# Patient Record
Sex: Male | Born: 1980
Health system: Southern US, Community
[De-identification: ages and names within clinical notes are randomized; demographics above are authoritative.]

## PROBLEM LIST (undated history)

## (undated) DIAGNOSIS — G44009 Cluster headache syndrome, unspecified, not intractable: Secondary | ICD-10-CM

## (undated) DIAGNOSIS — R7303 Prediabetes: Secondary | ICD-10-CM

## (undated) DIAGNOSIS — E669 Obesity, unspecified: Secondary | ICD-10-CM

## (undated) DIAGNOSIS — K219 Gastro-esophageal reflux disease without esophagitis: Secondary | ICD-10-CM

## (undated) DIAGNOSIS — G4733 Obstructive sleep apnea (adult) (pediatric): Secondary | ICD-10-CM

## (undated) DIAGNOSIS — I4510 Unspecified right bundle-branch block: Secondary | ICD-10-CM

## (undated) DIAGNOSIS — G4730 Sleep apnea, unspecified: Secondary | ICD-10-CM

## (undated) DIAGNOSIS — K760 Fatty (change of) liver, not elsewhere classified: Secondary | ICD-10-CM

## (undated) HISTORY — PX: FOOT SURGERY: SHX648

## (undated) HISTORY — DX: Obesity, unspecified: E66.9

## (undated) HISTORY — PX: TONSILLECTOMY: SUR1361

## (undated) HISTORY — DX: Prediabetes: R73.03

## (undated) HISTORY — DX: Gastro-esophageal reflux disease without esophagitis: K21.9

## (undated) HISTORY — DX: Fatty (change of) liver, not elsewhere classified: K76.0

## (undated) HISTORY — DX: Unspecified right bundle-branch block: I45.10

## (undated) HISTORY — DX: Obstructive sleep apnea (adult) (pediatric): G47.33

---
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
## 2000-03-16 ENCOUNTER — Emergency Department (HOSPITAL_COMMUNITY): Admission: EM | Admit: 2000-03-16 | Discharge: 2000-03-16 | Payer: Self-pay | Admitting: Internal Medicine

## 2000-06-18 ENCOUNTER — Encounter: Payer: Self-pay | Admitting: Emergency Medicine

## 2000-06-18 ENCOUNTER — Emergency Department (HOSPITAL_COMMUNITY): Admission: EM | Admit: 2000-06-18 | Discharge: 2000-06-18 | Payer: Self-pay | Admitting: Emergency Medicine

## 2000-08-27 ENCOUNTER — Ambulatory Visit (HOSPITAL_BASED_OUTPATIENT_CLINIC_OR_DEPARTMENT_OTHER): Admission: RE | Admit: 2000-08-27 | Discharge: 2000-08-27 | Payer: Self-pay | Admitting: Orthopedic Surgery

## 2000-08-31 ENCOUNTER — Encounter (HOSPITAL_COMMUNITY): Admission: RE | Admit: 2000-08-31 | Discharge: 2000-11-29 | Payer: Self-pay | Admitting: Orthopedic Surgery

## 2001-05-05 ENCOUNTER — Ambulatory Visit (HOSPITAL_BASED_OUTPATIENT_CLINIC_OR_DEPARTMENT_OTHER): Admission: RE | Admit: 2001-05-05 | Discharge: 2001-05-05 | Payer: Self-pay | Admitting: Internal Medicine

## 2001-06-07 ENCOUNTER — Ambulatory Visit (HOSPITAL_BASED_OUTPATIENT_CLINIC_OR_DEPARTMENT_OTHER): Admission: RE | Admit: 2001-06-07 | Discharge: 2001-06-07 | Payer: Self-pay | Admitting: Internal Medicine

## 2001-06-15 ENCOUNTER — Emergency Department (HOSPITAL_COMMUNITY): Admission: EM | Admit: 2001-06-15 | Discharge: 2001-06-16 | Payer: Self-pay | Admitting: *Deleted

## 2001-06-16 ENCOUNTER — Encounter: Payer: Self-pay | Admitting: Emergency Medicine

## 2001-08-12 ENCOUNTER — Ambulatory Visit (HOSPITAL_BASED_OUTPATIENT_CLINIC_OR_DEPARTMENT_OTHER): Admission: RE | Admit: 2001-08-12 | Discharge: 2001-08-12 | Payer: Self-pay | Admitting: Orthopedic Surgery

## 2001-08-12 ENCOUNTER — Encounter (INDEPENDENT_AMBULATORY_CARE_PROVIDER_SITE_OTHER): Payer: Self-pay | Admitting: Specialist

## 2003-09-17 ENCOUNTER — Emergency Department (HOSPITAL_COMMUNITY): Admission: EM | Admit: 2003-09-17 | Discharge: 2003-09-17 | Payer: Self-pay | Admitting: Emergency Medicine

## 2003-11-01 ENCOUNTER — Encounter (INDEPENDENT_AMBULATORY_CARE_PROVIDER_SITE_OTHER): Payer: Self-pay | Admitting: *Deleted

## 2003-11-01 ENCOUNTER — Observation Stay (HOSPITAL_COMMUNITY): Admission: RE | Admit: 2003-11-01 | Discharge: 2003-11-02 | Payer: Self-pay | Admitting: Otolaryngology

## 2003-11-06 ENCOUNTER — Ambulatory Visit (HOSPITAL_COMMUNITY): Admission: RE | Admit: 2003-11-06 | Discharge: 2003-11-06 | Payer: Self-pay | Admitting: Otolaryngology

## 2004-06-12 ENCOUNTER — Emergency Department (HOSPITAL_COMMUNITY): Admission: EM | Admit: 2004-06-12 | Discharge: 2004-06-12 | Payer: Self-pay | Admitting: Emergency Medicine

## 2004-06-30 ENCOUNTER — Emergency Department (HOSPITAL_COMMUNITY): Admission: EM | Admit: 2004-06-30 | Discharge: 2004-06-30 | Payer: Self-pay | Admitting: Emergency Medicine

## 2007-12-17 ENCOUNTER — Ambulatory Visit: Payer: Self-pay | Admitting: Internal Medicine

## 2007-12-17 DIAGNOSIS — F17200 Nicotine dependence, unspecified, uncomplicated: Secondary | ICD-10-CM | POA: Insufficient documentation

## 2007-12-17 DIAGNOSIS — K219 Gastro-esophageal reflux disease without esophagitis: Secondary | ICD-10-CM | POA: Insufficient documentation

## 2007-12-17 LAB — CONVERTED CEMR LAB
Bilirubin Urine: NEGATIVE
Blood in Urine, dipstick: NEGATIVE
Glucose, Urine, Semiquant: NEGATIVE
Ketones, urine, test strip: NEGATIVE
Nitrite: POSITIVE
Specific Gravity, Urine: 1.02
Urobilinogen, UA: 0.2
WBC Urine, dipstick: NEGATIVE
pH: 7

## 2007-12-21 ENCOUNTER — Encounter: Payer: Self-pay | Admitting: Internal Medicine

## 2007-12-21 DIAGNOSIS — R740 Nonspecific elevation of levels of transaminase and lactic acid dehydrogenase [LDH]: Secondary | ICD-10-CM

## 2007-12-21 LAB — CONVERTED CEMR LAB
ALT: 99 units/L — ABNORMAL HIGH (ref 0–53)
AST: 140 units/L — ABNORMAL HIGH (ref 0–37)
Albumin: 3.4 g/dL — ABNORMAL LOW (ref 3.5–5.2)
Alkaline Phosphatase: 54 units/L (ref 39–117)
BUN: 11 mg/dL (ref 6–23)
Basophils Absolute: 0 10*3/uL (ref 0.0–0.1)
Basophils Relative: 0.3 % (ref 0.0–1.0)
Bilirubin, Direct: 0.2 mg/dL (ref 0.0–0.3)
CO2: 29 meq/L (ref 19–32)
Calcium: 9.6 mg/dL (ref 8.4–10.5)
Chloride: 103 meq/L (ref 96–112)
Cholesterol: 156 mg/dL (ref 0–200)
Creatinine, Ser: 1 mg/dL (ref 0.4–1.5)
Eosinophils Absolute: 0.2 10*3/uL (ref 0.0–0.6)
Eosinophils Relative: 1.9 % (ref 0.0–5.0)
GFR calc Af Amer: 115 mL/min
GFR calc non Af Amer: 95 mL/min
Glucose, Bld: 87 mg/dL (ref 70–99)
HCT: 44.5 % (ref 39.0–52.0)
HCV Ab: NEGATIVE
HDL: 23.7 mg/dL — ABNORMAL LOW (ref >39.0)
Hemoglobin: 14.1 g/dL (ref 13.0–17.0)
Hep B C IgM: NEGATIVE
Hep B Core Total Ab: NEGATIVE
Hep B S Ab: POSITIVE — AB
Hepatitis B Surface Ag: NEGATIVE
LDL Cholesterol: 100 mg/dL — ABNORMAL HIGH (ref 0–99)
Lymphocytes Relative: 25.3 % (ref 12.0–46.0)
MCHC: 31.7 g/dL (ref 30.0–36.0)
MCV: 83 fL (ref 78.0–100.0)
Monocytes Absolute: 0.8 10*3/uL — ABNORMAL HIGH (ref 0.2–0.7)
Monocytes Relative: 6.9 % (ref 3.0–11.0)
Neutro Abs: 7.7 10*3/uL (ref 1.4–7.7)
Neutrophils Relative %: 65.6 % (ref 43.0–77.0)
Platelets: 273 10*3/uL (ref 150–400)
Potassium: 4.4 meq/L (ref 3.5–5.1)
RBC: 5.36 M/uL (ref 4.22–5.81)
RDW: 13.2 % (ref 11.5–14.6)
Sodium: 139 meq/L (ref 135–145)
TSH: 0.9 microintl units/mL (ref 0.35–5.50)
Total Bilirubin: 0.7 mg/dL (ref 0.3–1.2)
Total CHOL/HDL Ratio: 6.6
Total Protein: 6 g/dL (ref 6.0–8.3)
Triglycerides: 164 mg/dL — ABNORMAL HIGH (ref 0–149)
VLDL: 33 mg/dL (ref 0–40)
WBC: 11.7 10*3/uL — ABNORMAL HIGH (ref 4.5–10.5)

## 2009-09-29 ENCOUNTER — Emergency Department (HOSPITAL_COMMUNITY): Admission: EM | Admit: 2009-09-29 | Discharge: 2009-09-29 | Payer: Self-pay | Admitting: Emergency Medicine

## 2010-10-10 ENCOUNTER — Emergency Department (HOSPITAL_COMMUNITY)
Admission: EM | Admit: 2010-10-10 | Discharge: 2010-10-10 | Payer: Self-pay | Source: Home / Self Care | Admitting: Emergency Medicine

## 2011-01-08 ENCOUNTER — Emergency Department: Payer: Self-pay | Admitting: Emergency Medicine

## 2011-03-03 ENCOUNTER — Emergency Department (HOSPITAL_COMMUNITY)
Admission: EM | Admit: 2011-03-03 | Discharge: 2011-03-03 | Disposition: A | Discharge status: Home / Self Care | Payer: Self-pay | Attending: Emergency Medicine | Admitting: Emergency Medicine

## 2011-03-03 ENCOUNTER — Emergency Department (HOSPITAL_COMMUNITY): Payer: Self-pay

## 2011-03-03 DIAGNOSIS — R071 Chest pain on breathing: Secondary | ICD-10-CM | POA: Insufficient documentation

## 2011-03-03 DIAGNOSIS — R079 Chest pain, unspecified: Secondary | ICD-10-CM | POA: Insufficient documentation

## 2011-03-03 DIAGNOSIS — R0602 Shortness of breath: Secondary | ICD-10-CM | POA: Insufficient documentation

## 2011-03-03 DIAGNOSIS — F17200 Nicotine dependence, unspecified, uncomplicated: Secondary | ICD-10-CM | POA: Insufficient documentation

## 2011-03-03 DIAGNOSIS — R0789 Other chest pain: Secondary | ICD-10-CM | POA: Insufficient documentation

## 2011-03-03 LAB — POCT I-STAT, CHEM 8
BUN: 14 mg/dL (ref 6–23)
Calcium, Ion: 1.13 mmol/L (ref 1.12–1.32)
Chloride: 101 mEq/L (ref 96–112)
Creatinine, Ser: 1 mg/dL (ref 0.4–1.5)
Glucose, Bld: 105 mg/dL — ABNORMAL HIGH (ref 70–99)
HCT: 46 % (ref 39.0–52.0)
Hemoglobin: 15.6 g/dL (ref 13.0–17.0)
Potassium: 3.7 mEq/L (ref 3.5–5.1)
Sodium: 138 mEq/L (ref 135–145)
TCO2: 28 mmol/L (ref 0–100)

## 2011-03-03 LAB — D-DIMER, QUANTITATIVE

## 2011-03-07 NOTE — Op Note (Signed)
[place]. [facility]  Patient:    Jorge Allen, Jorge Allen                       MRN: [id number] Proc. Date: 08/27/00 Adm. Date:  [id number] Attending:  Ronne Binning CC:         Two copies to Dr. Merlyn Lot   Operative Report  PREOPERATIVE DIAGNOSIS:  Abscess left middle finger.  POSTOPERATIVE DIAGNOSIS:  Abscess left middle finger.  OPERATION:  Incision and drainage left middle finger.  SURGEON:  Nicki Reaper, M.D.  ASSISTANT:  Joaquin Courts, R.N.  ANESTHESIA:  Metacarpal block.  DESCRIPTION OF PROCEDURE:  The patient was given a metacarpal block with 1% xylocaine, 4 cc was used.  This was without epinephrine.  He was prepped and draped using Betadine scrub and solution.  A Penrose drain was used for tourniquet control at the base of the finger.  An oblique incision was made over the mass, carried down through subcutaneous tissue.  The abscess cavity was immediately encountered.  This was opened.  Cultures were taken.  This was irrigated and packed with Iodoform gauze.  Sterile compressive dressing was applied.  The patient tolerated the procedure well.  He was discharged home to return to the [facility] on Monday on Tylenol 3 and the Ampicillin which he was given. DD:  08/27/00 TD:  08/28/00 Job: [id number]

## 2011-03-07 NOTE — Op Note (Signed)
Jorge Allen, Jorge Allen                        ACCOUNT NO.:  [id number]   MEDICAL RECORD NO.:  [id number]                   PATIENT TYPE:  INP   LOCATION:  2106                                 FACILITY:  [facility]   PHYSICIAN:  Lucky Cowboy, M.D.                    DATE OF BIRTH:  08-15-1981   DATE OF PROCEDURE:  11/01/2003  DATE OF DISCHARGE:  11/02/2003                                 OPERATIVE REPORT   PREOPERATIVE DIAGNOSES:  Chronic left serous effusion, obstructive sleep  apnea, tonsillar hypertrophy, bilateral inferior turbinate reduction.   POSTOPERATIVE DIAGNOSES:  Chronic left serous effusion, obstructive sleep  apnea, tonsillar hypertrophy, bilateral inferior turbinate reduction, with  obstructing adenoid hypertrophy.   PROCEDURE:  Left myringotomy with tube placement, tonsillectomy,  uvulopharyngopalatoplasty, adenoidectomy, bilateral inferior turbinate  reduction.   SURGEON:  Lucky Cowboy, M.D.   ANESTHESIA:  General endotracheal anesthesia.   ESTIMATED BLOOD LOSS:  30 mL.   SPECIMENS:  Tonsils and soft palate as well as adenoid tissue.   COMPLICATIONS:  None.   INDICATIONS:  This patient is a 30 year old male with severe obstructive  sleep apnea and an RDI of 49.  He has had extreme difficulty with nasal  obstruction despite the use of the nasal steroid sprays.  It was felt that  this would eliminate his ability to tolerate the CPAP machine.  He cannot  breathe well through his mouth or nose.  There is profuse tonsillar  hypertrophy with inability to see the posterior pharyngeal wall.  Additionally, there is chronic left ear pain with an ongoing serous effusion  which has failed to clear after six weeks despite antibiotic therapy.  This  is associated with a conductive hearing loss.  For these reasons, the above  procedures are performed.   FINDINGS:  The patient was noted to have profuse tonsillar hypertrophy with  an elongated soft palate and uvula.  There were  also findings of an  obstructing adenoid hypertrophy, and for this reason, the adenoid tissue was  removed.  The patient had a serous effusion with significant middle ear  mucosal edema.  There was profuse bony and soft tissue turbinate  hypertrophy.   PROCEDURE:  The patient was taken to the operating room and placed on the  table in the supine position.  He was then placed under general endotracheal  anesthesia and a #4 ear speculum placed into the right external auditory  canal.  With the aid of the operating microscope, cerumen was removed with a  curette and suctioned.  There were no abnormalities of the right tympanic  membrane and middle ear space.  Attention was then turned to the left ear.  In a similar fashion, cerumen was removed.  A myringotomy knife was used to  make an incision in the anterior-inferior quadrant.  Middle ear fluid was  evacuated and an Activent tube placed through the tympanic membrane and  secured in place with a pick.  Ciprodex Otic was instilled.  The table was  then rotated counter-clockwise 90 degrees.   The head and body were draped in the usual sterile fashion.  A Crowe-Davis  mouth gag with a #5 tongue blade was then placed intraorally, opened and  suspended on the Mayo stand.  Inspection of the nasopharynx was performed  using a mirror with a large amount of adenoid tissue being identified.  The  tonsillectomy portion of the procedure was performed first.  The right  palatine tonsils were grasped with Allis clamps and directed inferomedially.  Harmonic scalpel was then used to excise the tonsil, staying within the  peritonsillar space.  The left palatine tonsil was removed in an identical  fashion.  At this point, the velopharyngeal dimple was identified using  digital palpation.  The Harmonic scalpel was then used to excise this level  of the soft palate.  Back cuts were made in the anterior tonsillar pillars  bilaterally.  The anterior and  posterior tonsillar pillars were  reapproximated in a simple interrupted fashion using 4-0 Vicryl suture.  The  soft palate was reapproximated in a simple interrupted fashion using 4-0  Vicryl.   At this point, the palate was suspended using a red rubber catheter.  A  large adenoid curette was placed against the __________and directed  inferiorly, severing the majority of the adenoid pad.  Subsequent passes  were made.  Two sterile gauze Afrin-soaked packs were placed in the  nasopharynx and time allowed for hemostasis.  The packs were removed and  suction cautery performed.  There was a significant amount of adenoid  hypertrophy with some extension into the nasal cavities which was  cauterized.  The nasopharynx was copiously irrigated transnasally, which was  suctioned out through the oral cavity.  An NG tube was then placed down the  esophagus for suctioning of the gastric contents.  The mouth gag was then  removed, noting no damage to the teeth or soft tissues.   At this point, both sides of the nasal cavity were decongested using Afrin  on cottonoid pledgets.  The 0-degree Storz Hopkins endoscope was used to  visualize both sides of the nasal cavity.  The left side was performed  first.  The left inferior turbinate was injected with 1% lidocaine with  1:100,000 of epinephrine.  The microdebrider was then used to remove the  redundant mucosa over the lower one-half of the turbinate.  Redundant bone  was taken down using the Tru-Cut forceps.  Suction cautery was performed.  The right inferior one-half of the inferior turbinate was removed in an  identical fashion.  Suction cautery was performed.  Bactroban-coated Telfa  packs were placed in both side of the nasal cavity and tied to one another  anterior to the columella.  The table was rotated clockwise 90 degrees to  its original position.  The patient was awakened from anesthesia and extubated in the operating  room.  Upon  awakening from anesthesia, the patient did go into laryngospasm  and had to be briefly paralyzed.  He was then brought up on mask ventilation  and taken to the recovery room in stable condition.                                               Lucky Cowboy, M.D.  SJ/MEDQ  D:  11/03/2003  T:  11/03/2003  Job:  [id number]   cc:   [facility]   Thora Lance, M.D.  [address]  [place]  [place] [address]  Fax: [phone]

## 2011-03-07 NOTE — Op Note (Signed)
[place]. [facility]  Patient:    LYMON, KIDNEY Visit Number: [id number] MRN: [id number]          Service Type: DSU Location: [facility] Attending Physician:  Ronne Binning Dictated by:   Nicki Reaper, M.D. Proc. Date: 08/12/01 Admit Date:  08/12/2001                             Operative Report  PREOPERATIVE DIAGNOSIS: Multiple mass, probable cyst, of right index and right ring fingers.  POSTOPERATIVE DIAGNOSIS: Multiple mass, probable cyst, of right index and right ring fingers.  OPERATION/PROCEDURE: Excision of mass of distal interphalangeal phalanx of index and proximal interphalangeal joint of index, distal interphalangeal phalanx of ring and P ring of right hand.  SURGEON: Nicki Reaper, M.D.  ASSISTANT: Joaquin Courts, R.N.  ANESTHESIA: IV regional.  ANESTHESIOLOGIST: Burna Forts, M.D.  INDICATIONS FOR PROCEDURE: The patient is a 30 year old male, with a history of a series of masses at the metacarpophalangeal joint crease of his ring finger, DIP joint of his ring, PIP of his index, DIP of his index of right hand, and desires having these removed.  DESCRIPTION OF PROCEDURE: The patient was brought to the operating room, where a forearm-based IV regional anesthetic was carried out without difficulty.  He was prepped and draped using Betadine scrubbing solution with the right arm free.  Each mass was excised separately using sharp dissection.  Elliptical incisions were made transversely in the joint crease in that each one of these was in the joint crease.  These were removed separately from the DIP of the index, the PIP of the index, the MP of the ring and the DIP of the ring finger.  Care was taken to protect neurovascular structures.  Hard masses with each were sent separately to pathology.  The wounds were irrigated.  The skin was closed with interrupted 5-0 nylon sutures.  A sterile compressive dressing was applied to each  finger.  The patient tolerated the procedure well and was taken to the recovery room for observation in satisfactory condition.  He is discharged home to return to the [facility] in one week, on Vicodin and Keflex. Dictated by:   Nicki Reaper, M.D. Attending Physician:  Ronne Binning DD:  08/12/01 TD:  08/13/01 Job: [id number]

## 2011-05-29 ENCOUNTER — Emergency Department (HOSPITAL_COMMUNITY)
Admission: EM | Admit: 2011-05-29 | Discharge: 2011-05-29 | Disposition: A | Discharge status: Home / Self Care | Payer: Self-pay | Attending: Emergency Medicine | Admitting: Emergency Medicine

## 2011-05-29 DIAGNOSIS — J069 Acute upper respiratory infection, unspecified: Secondary | ICD-10-CM | POA: Insufficient documentation

## 2011-05-29 DIAGNOSIS — R490 Dysphonia: Secondary | ICD-10-CM | POA: Insufficient documentation

## 2011-06-01 ENCOUNTER — Emergency Department (HOSPITAL_COMMUNITY)
Admission: EM | Admit: 2011-06-01 | Discharge: 2011-06-01 | Disposition: A | Discharge status: Home / Self Care | Payer: Self-pay | Attending: Emergency Medicine | Admitting: Emergency Medicine

## 2011-06-01 DIAGNOSIS — R05 Cough: Secondary | ICD-10-CM | POA: Insufficient documentation

## 2011-06-01 DIAGNOSIS — J029 Acute pharyngitis, unspecified: Secondary | ICD-10-CM | POA: Insufficient documentation

## 2011-06-01 DIAGNOSIS — R059 Cough, unspecified: Secondary | ICD-10-CM | POA: Insufficient documentation

## 2011-06-01 LAB — RAPID STREP SCREEN (MED CTR MEBANE ONLY): Streptococcus, Group A Screen (Direct): NEGATIVE

## 2012-10-25 ENCOUNTER — Emergency Department (HOSPITAL_COMMUNITY)
Admission: EM | Admit: 2012-10-25 | Discharge: 2012-10-25 | Disposition: A | Discharge status: Home Health | Payer: BC Managed Care – PPO | Attending: Emergency Medicine | Admitting: Emergency Medicine

## 2012-10-25 ENCOUNTER — Emergency Department (HOSPITAL_COMMUNITY): Payer: BC Managed Care – PPO

## 2012-10-25 ENCOUNTER — Encounter (HOSPITAL_COMMUNITY): Payer: Self-pay | Admitting: Emergency Medicine

## 2012-10-25 DIAGNOSIS — R0989 Other specified symptoms and signs involving the circulatory and respiratory systems: Secondary | ICD-10-CM | POA: Insufficient documentation

## 2012-10-25 DIAGNOSIS — M5380 Other specified dorsopathies, site unspecified: Secondary | ICD-10-CM | POA: Insufficient documentation

## 2012-10-25 DIAGNOSIS — M62830 Muscle spasm of back: Secondary | ICD-10-CM

## 2012-10-25 DIAGNOSIS — F17200 Nicotine dependence, unspecified, uncomplicated: Secondary | ICD-10-CM | POA: Insufficient documentation

## 2012-10-25 DIAGNOSIS — R0609 Other forms of dyspnea: Secondary | ICD-10-CM | POA: Insufficient documentation

## 2012-10-25 DIAGNOSIS — R0602 Shortness of breath: Secondary | ICD-10-CM | POA: Insufficient documentation

## 2012-10-25 DIAGNOSIS — R079 Chest pain, unspecified: Secondary | ICD-10-CM | POA: Insufficient documentation

## 2012-10-25 LAB — CBC WITH DIFFERENTIAL/PLATELET
Basophils Absolute: 0 10*3/uL (ref 0.0–0.1)
Basophils Relative: 0 % (ref 0–1)
Eosinophils Absolute: 0.2 10*3/uL (ref 0.0–0.7)
Eosinophils Relative: 1 % (ref 0–5)
HCT: 44.4 % (ref 39.0–52.0)
Hemoglobin: 14.6 g/dL (ref 13.0–17.0)
Lymphocytes Relative: 31 % (ref 12–46)
Lymphs Abs: 4.6 10*3/uL — ABNORMAL HIGH (ref 0.7–4.0)
MCH: 27.7 pg (ref 26.0–34.0)
MCHC: 32.9 g/dL (ref 30.0–36.0)
MCV: 84.1 fL (ref 78.0–100.0)
Monocytes Absolute: 0.9 10*3/uL (ref 0.1–1.0)
Monocytes Relative: 6 % (ref 3–12)
Neutro Abs: 8.9 10*3/uL — ABNORMAL HIGH (ref 1.7–7.7)
Neutrophils Relative %: 61 % (ref 43–77)
Platelets: 236 10*3/uL (ref 150–400)
RBC: 5.28 MIL/uL (ref 4.22–5.81)
RDW: 14.3 % (ref 11.5–15.5)
WBC: 14.7 10*3/uL — ABNORMAL HIGH (ref 4.0–10.5)

## 2012-10-25 LAB — BASIC METABOLIC PANEL
BUN: 14 mg/dL (ref 6–23)
CO2: 25 mEq/L (ref 19–32)
Calcium: 9.1 mg/dL (ref 8.4–10.5)
Chloride: 100 mEq/L (ref 96–112)
Creatinine, Ser: 0.84 mg/dL (ref 0.50–1.35)
GFR calc Af Amer: >90 mL/min (ref >90)
GFR calc non Af Amer: >90 mL/min (ref >90)
Glucose, Bld: 104 mg/dL — ABNORMAL HIGH (ref 70–99)
Potassium: 4.3 mEq/L (ref 3.5–5.1)
Sodium: 135 mEq/L (ref 135–145)

## 2012-10-25 LAB — D-DIMER, QUANTITATIVE (NOT AT ARMC): D-Dimer, Quant: <0.27 ug/mL-FEU (ref 0.00–0.48)

## 2012-10-25 MED ORDER — FENTANYL CITRATE 0.05 MG/ML IJ SOLN
100.0000 ug | Freq: Once | INTRAMUSCULAR | Status: AC
  Administered 2012-10-25: 100 ug via INTRAVENOUS
  Filled 2012-10-25: qty 2

## 2012-10-25 MED ORDER — DIAZEPAM 5 MG PO TABS
5.0000 mg | ORAL_TABLET | Freq: Once | ORAL | Status: AC
  Administered 2012-10-25: 5 mg via ORAL
  Filled 2012-10-25: qty 1

## 2012-10-25 MED ORDER — OXYCODONE-ACETAMINOPHEN 5-325 MG PO TABS
2.0000 | ORAL_TABLET | Freq: Once | ORAL | Status: AC
  Administered 2012-10-25: 2 via ORAL
  Filled 2012-10-25: qty 2

## 2012-10-25 MED ORDER — OXYCODONE-ACETAMINOPHEN 5-325 MG PO TABS: 2.0000 | ORAL_TABLET | Freq: Four times a day (QID) | ORAL | Status: DC | PRN

## 2012-10-25 MED ORDER — DIAZEPAM 5 MG PO TABS: 5.0000 mg | ORAL_TABLET | Freq: Three times a day (TID) | ORAL | Status: DC | PRN

## 2012-10-25 MED ORDER — HYDROCODONE-ACETAMINOPHEN 5-325 MG PO TABS: 2.0000 | ORAL_TABLET | ORAL | Status: DC | PRN

## 2012-10-25 MED ORDER — MORPHINE SULFATE 4 MG/ML IJ SOLN
4.0000 mg | Freq: Once | INTRAMUSCULAR | Status: AC
  Administered 2012-10-25: 4 mg via INTRAVENOUS
  Filled 2012-10-25: qty 1

## 2012-10-25 MED ORDER — IBUPROFEN 800 MG PO TABS: 800.0000 mg | ORAL_TABLET | Freq: Three times a day (TID) | ORAL | Status: DC

## 2012-10-25 NOTE — ED Provider Notes (Signed)
History     CSN: [id number]  Arrival date & time 10/25/12  0151   First MD Initiated Contact with Patient 10/25/12 0224      Chief Complaint  Patient presents with  . Shortness of Breath    (Consider location/radiation/quality/duration/timing/severity/associated sxs/prior treatment) HPI 32 year old male presents to emergency room from home with acute onset of pain in his right posterior back and shortness of breath. Patient reports taking deep breath if the pain worse. He was talking on the phone with the pain came on. He denies any trauma. He has had no cough or fever. No leg swelling no prolonged immobilization no history of PE or DVT in himself or family. He has no medical problems, and takes no medications. History reviewed. No pertinent past medical history.  Past Surgical History  Procedure Date  . Foot surgery   . Tonsillectomy     No family history on file.  History  Substance Use Topics  . Smoking status: Current Every Day Smoker  . Smokeless tobacco: Not on file  . Alcohol Use: Yes      Review of Systems  Unable to perform ROS: Other   significant pain  Allergies  Review of patient's allergies indicates no known allergies.  Home Medications   Current Outpatient Rx  Name  Route  Sig  Dispense  Refill  . DIAZEPAM 5 MG PO TABS   Oral   Take 1 tablet (5 mg total) by mouth every 8 (eight) hours as needed (muscle spasm).   20 tablet   0   . HYDROCODONE-ACETAMINOPHEN 5-325 MG PO TABS   Oral   Take 2 tablets by mouth every 4 (four) hours as needed for pain.   20 tablet   0   . IBUPROFEN 800 MG PO TABS   Oral   Take 1 tablet (800 mg total) by mouth 3 (three) times daily.   21 tablet   0     BP 128/57  Temp 98.3 F (36.8 C) (Oral)  Resp 22  SpO2 96%  Physical Exam  Nursing note and vitals reviewed. Constitutional: He is oriented to person, place, and time. He appears distressed ( patient appears to be in significant pain).  HENT:  Head:  Normocephalic and atraumatic.  Nose: Nose normal.  Mouth/Throat: Oropharynx is clear and moist.  Neck: No JVD present. No tracheal deviation present. No thyromegaly present.  Cardiovascular: Normal rate, regular rhythm, normal heart sounds and intact distal pulses.  Exam reveals no gallop and no friction rub.   No murmur heard. Pulmonary/Chest: Effort normal and breath sounds normal. No stridor. No respiratory distress. He has no wheezes. He has no rales. He exhibits tenderness (tenderness of palpation to right lower posterior chest wall without skin lesions).  Abdominal: Soft. Bowel sounds are normal. He exhibits no distension and no mass. There is no tenderness. There is no rebound and no guarding.  Musculoskeletal: Normal range of motion. He exhibits no edema and no tenderness.  Lymphadenopathy:    He has no cervical adenopathy.  Neurological: He is alert and oriented to person, place, and time.  Skin: Skin is warm and dry. No rash noted. No erythema. No pallor.    ED Course  Procedures (including critical care time)  Labs Reviewed  CBC WITH DIFFERENTIAL - Abnormal; Notable for the following:    WBC 14.7 (*)     Neutro Abs 8.9 (*)     Lymphs Abs 4.6 (*)     All other components within  normal limits  BASIC METABOLIC PANEL - Abnormal; Notable for the following:    Glucose, Bld 104 (*)     All other components within normal limits  D-DIMER, QUANTITATIVE   Dg Chest 2 View  10/25/2012  *RADIOLOGY REPORT*  Clinical Data: Sudden onset shortness of breath.  Chest and back pain.  Question pneumothorax.  CHEST - 2 VIEW  Comparison: PA and lateral chest 04/16/2011.  Findings: There is no pneumothorax.  Lungs are clear.  Heart size is normal.  No pleural effusion.  IMPRESSION: No acute disease.   Original Report Authenticated By: Holley Dexter, M.D.     Date: 10/25/2012  Rate: 92  Rhythm: normal sinus rhythm  QRS Axis: normal  Intervals: normal  ST/T Wave abnormalities: normal   Conduction Disutrbances:none  Narrative Interpretation:   Old EKG Reviewed: none available     1. Muscle spasm of back       MDM  32 year old male with chest pain and dyspnea. No true risk factors for PE,, will get d-dimer. Chest x-ray and lab work significant only for mild leukocytosis. Pain better with small sentinel.  D-dimer negative. Suspect possible acute muscle spasm as cause for symptoms. Will treat with IM Norco pro-1 compresses. Follow closely with primary care Dr.        Olivia Mackie, MD 10/25/12 [phone]

## 2012-10-25 NOTE — ED Notes (Signed)
C/o sob since 11pm.  Denies cough.  Reports mid back pain.  States he was on the phone and it felt like someone knocked the wind out of him.

## 2012-10-25 NOTE — ED Notes (Signed)
Pt sts sudden onset of right lower midback pain. Pt sts he is unable to breathe when standing straight. Pt a&ox4 and tachpneic

## 2012-10-25 NOTE — ED Notes (Signed)
Pt. 02 sats 98-100/RA  put on 2L for comfort.

## 2013-01-07 ENCOUNTER — Other Ambulatory Visit: Payer: Self-pay | Admitting: Podiatry

## 2015-08-22 ENCOUNTER — Emergency Department (HOSPITAL_COMMUNITY)
Admission: EM | Admit: 2015-08-22 | Discharge: 2015-08-22 | Disposition: A | Discharge status: Home / Self Care | Payer: Managed Care, Other (non HMO) | Attending: Emergency Medicine | Admitting: Emergency Medicine

## 2015-08-22 ENCOUNTER — Encounter (HOSPITAL_COMMUNITY): Payer: Self-pay | Admitting: Emergency Medicine

## 2015-08-22 ENCOUNTER — Emergency Department (HOSPITAL_COMMUNITY): Payer: Managed Care, Other (non HMO)

## 2015-08-22 DIAGNOSIS — F17210 Nicotine dependence, cigarettes, uncomplicated: Secondary | ICD-10-CM | POA: Diagnosis not present

## 2015-08-22 DIAGNOSIS — R079 Chest pain, unspecified: Secondary | ICD-10-CM | POA: Diagnosis present

## 2015-08-22 DIAGNOSIS — R0789 Other chest pain: Secondary | ICD-10-CM | POA: Diagnosis not present

## 2015-08-22 DIAGNOSIS — Z8669 Personal history of other diseases of the nervous system and sense organs: Secondary | ICD-10-CM | POA: Diagnosis not present

## 2015-08-22 DIAGNOSIS — Z79899 Other long term (current) drug therapy: Secondary | ICD-10-CM | POA: Insufficient documentation

## 2015-08-22 HISTORY — DX: Sleep apnea, unspecified: G47.30

## 2015-08-22 LAB — BASIC METABOLIC PANEL
Anion gap: 5 (ref 5–15)
BUN: 11 mg/dL (ref 6–20)
CO2: 27 mmol/L (ref 22–32)
Calcium: 8.8 mg/dL — ABNORMAL LOW (ref 8.9–10.3)
Chloride: 106 mmol/L (ref 101–111)
Creatinine, Ser: 0.77 mg/dL (ref 0.61–1.24)
GFR calc Af Amer: >60 mL/min (ref >60)
GFR calc non Af Amer: >60 mL/min (ref >60)
Glucose, Bld: 131 mg/dL — ABNORMAL HIGH (ref 65–99)
Potassium: 3.8 mmol/L (ref 3.5–5.1)
Sodium: 138 mmol/L (ref 135–145)

## 2015-08-22 LAB — CBC
HCT: 42.3 % (ref 39.0–52.0)
Hemoglobin: 13.6 g/dL (ref 13.0–17.0)
MCH: 26.9 pg (ref 26.0–34.0)
MCHC: 32.2 g/dL (ref 30.0–36.0)
MCV: 83.6 fL (ref 78.0–100.0)
Platelets: 239 10*3/uL (ref 150–400)
RBC: 5.06 MIL/uL (ref 4.22–5.81)
RDW: 14.8 % (ref 11.5–15.5)
WBC: 10.2 10*3/uL (ref 4.0–10.5)

## 2015-08-22 LAB — I-STAT TROPONIN, ED
Troponin i, poc: 0 ng/mL (ref 0.00–0.08)
Troponin i, poc: 0 ng/mL (ref 0.00–0.08)

## 2015-08-22 MED ORDER — IBUPROFEN 600 MG PO TABS: 600.0000 mg | ORAL_TABLET | Freq: Four times a day (QID) | ORAL | Status: DC | PRN

## 2015-08-22 MED ORDER — ASPIRIN 81 MG PO CHEW
324.0000 mg | CHEWABLE_TABLET | Freq: Once | ORAL | Status: AC
  Administered 2015-08-22: 324 mg via ORAL
  Filled 2015-08-22: qty 4

## 2015-08-22 MED ORDER — CYCLOBENZAPRINE HCL 10 MG PO TABS: 10.0000 mg | ORAL_TABLET | Freq: Two times a day (BID) | ORAL | Status: DC | PRN

## 2015-08-22 NOTE — ED Notes (Signed)
Off floor for testing 

## 2015-08-22 NOTE — ED Notes (Signed)
Pt c/o central chest pain that started a week ago but has gotten worse over the past couple days.  Pt states that movement makes the pain worse.  Pt is "sometime smoker".

## 2015-08-22 NOTE — Discharge Instructions (Signed)
Chest Wall Pain °Chest wall pain is pain in or around the bones and muscles of your chest. Sometimes, an injury causes this pain. Sometimes, the cause may not be known. This pain may take several weeks or longer to get better. °HOME CARE INSTRUCTIONS  °Pay attention to any changes in your symptoms. Take these actions to help with your pain:  °· Rest as told by your health care provider.   °· Avoid activities that cause pain. These include any activities that use your chest muscles or your abdominal and side muscles to lift heavy items.    °· If directed, apply ice to the painful area: °¨ Put ice in a plastic bag. °¨ Place a towel between your skin and the bag. °¨ Leave the ice on for 20 minutes, 2-3 times per day. °· Take over-the-counter and prescription medicines only as told by your health care provider. °· Do not use tobacco products, including cigarettes, chewing tobacco, and e-cigarettes. If you need help quitting, ask your health care provider. °· Keep all follow-up visits as told by your health care provider. This is important. °SEEK MEDICAL CARE IF: °· You have a fever. °· Your chest pain becomes worse. °· You have new symptoms. °SEEK IMMEDIATE MEDICAL CARE IF: °· You have nausea or vomiting. °· You feel sweaty or light-headed. °· You have a cough with phlegm (sputum) or you cough up blood. °· You develop shortness of breath. °  °This information is not intended to replace advice given to you by your health care provider. Make sure you discuss any questions you have with your health care provider. °  °Document Released: 10/06/2005 Document Revised: 06/27/2015 Document Reviewed: 01/01/2015 °Elsevier Interactive Patient Education ©2016 [employer] ° °Chest Pain Observation °It is often hard to give a specific diagnosis for the cause of chest pain. Among other possibilities your symptoms might be caused by inadequate oxygen delivery to your heart (angina). Angina that is not treated or evaluated can lead to  a heart attack (myocardial infarction) or death. °Blood tests, electrocardiograms, and X-rays may have been done to help determine a possible cause of your chest pain. After evaluation and observation, your health care provider has determined that it is unlikely your pain was caused by an unstable condition that requires hospitalization. However, a full evaluation of your pain may need to be completed, with additional diagnostic testing as directed. It is very important to keep your follow-up appointments. Not keeping your follow-up appointments could result in permanent heart damage, disability, or death. If there is any problem keeping your follow-up appointments, you must call your health care provider. °HOME CARE INSTRUCTIONS  °Due to the slight chance that your pain could be angina, it is important to follow your health care provider's treatment plan and also maintain a healthy lifestyle: °· Maintain or work toward achieving a healthy weight. °· Stay physically active and exercise regularly. °· Decrease your salt intake. °· Eat a balanced, healthy diet. Talk to a dietitian to learn about heart-healthy foods. °· Increase your fiber intake by including whole grains, vegetables, fruits, and nuts in your diet. °· Avoid situations that cause stress, anger, or depression. °· Take medicines as advised by your health care provider. Report any side effects to your health care provider. Do not stop medicines or adjust the dosages on your own. °· Quit smoking. Do not use nicotine patches or gum until you check with your health care provider. °· Keep your blood pressure, blood sugar, and cholesterol levels within normal   limits.  Limit alcohol intake to no more than 1 drink per day for women who are not pregnant and 2 drinks per day for men.  Do not abuse drugs. SEEK IMMEDIATE MEDICAL CARE IF: You have severe chest pain or pressure which may include symptoms such as:  You feel pain or pressure in your arms, neck,  jaw, or back.  You have severe back or abdominal pain, feel sick to your stomach (nauseous), or throw up (vomit).  You are sweating profusely.  You are having a fast or irregular heartbeat.  You feel short of breath while at rest.  You notice increasing shortness of breath during rest, sleep, or with activity.  You have chest pain that does not get better after rest or after taking your usual medicine.  You wake from sleep with chest pain.  You are unable to sleep because you cannot breathe.  You develop a frequent cough or you are coughing up blood.  You feel dizzy, faint, or experience extreme fatigue.  You develop severe weakness, dizziness, fainting, or chills. Any of these symptoms may represent a serious problem that is an emergency. Do not wait to see if the symptoms will go away. Call your local emergency services (911 in the [place] Do not drive yourself to the hospital. MAKE SURE YOU:  Understand these instructions.  Will watch your condition.  Will get help right away if you are not doing well or get worse.   This information is not intended to replace advice given to you by your health care provider. Make sure you discuss any questions you have with your health care provider.  Follow up with your primary care provider if symptoms do not improve. Return to the emergency department if you experience worsening of your symptoms, fever, numbness, tingling, dizziness, loss of consciousness, increased chest pain.

## 2015-08-22 NOTE — ED Provider Notes (Signed)
CSN: [id number]     Arrival date & time 08/22/15  0915 History   First MD Initiated Contact with Patient 08/22/15 [phone]     Chief Complaint  Patient presents with  . Chest Pain     (Consider location/radiation/quality/duration/timing/severity/associated sxs/prior Treatment) HPI  Jorge Allen is a 34 y.o M with no significant past medical history who presents to the emergency department today complaining of substernal chest pain onset 1 week ago. Pt states that last week he was sitting at work when he felt a gradual onset of substernal chest pain. Pain is worsened with movement. Pain has stayed consistent for 1 week. Does not radiate. Denies SOB, syncope, dizziness, numbness, tingling, weakness. Pt states that he has experienced this before when he pulled a muscle. No personal or family cardiac history. Pt is a recreational smoker, 1 pack per week.   Past Medical History  Diagnosis Date  . Sleep apnea    Past Surgical History  Procedure Laterality Date  . Foot surgery    . Tonsillectomy     No family history on file. Social History  Substance Use Topics  . Smoking status: Current Every Day Smoker    Types: Cigarettes  . Smokeless tobacco: None  . Alcohol Use: Yes    Review of Systems  All other systems reviewed and are negative.     Allergies  Review of patient's allergies indicates no known allergies.  Home Medications   Prior to Admission medications   Medication Sig Start Date End Date Taking? Authorizing Provider  Aspirin-Acetaminophen-Caffeine (GOODY HEADACHE PO) Take 1 packet by mouth every 8 (eight) hours as needed (for pain).   Yes Historical Provider, MD  Multiple Vitamin (MULTIVITAMIN WITH MINERALS) TABS tablet Take 1 tablet by mouth daily.   Yes Historical Provider, MD  diazepam (VALIUM) 5 MG tablet Take 1 tablet (5 mg total) by mouth every 8 (eight) hours as needed (muscle spasm). Patient not taking: Reported on 08/22/2015 10/25/12   Marisa Severin, MD   HYDROcodone-acetaminophen (NORCO/VICODIN) 5-325 MG per tablet Take 2 tablets by mouth every 4 (four) hours as needed for pain. Patient not taking: Reported on 08/22/2015 10/25/12   Marisa Severin, MD  ibuprofen (ADVIL,MOTRIN) 800 MG tablet Take 1 tablet (800 mg total) by mouth 3 (three) times daily. Patient not taking: Reported on 08/22/2015 10/25/12   Marisa Severin, MD   BP 134/91 mmHg  Pulse 92  Temp(Src) 98.8 F (37.1 C) (Oral)  Resp 16  Ht  (1.651 m)  Wt 310 lb (140.615 kg)  BMI 51.59 kg/m2  SpO2 97% Physical Exam  Constitutional: He is oriented to person, place, and time. He appears well-developed and well-nourished. No distress.  HENT:  Head: Normocephalic and atraumatic.  Mouth/Throat: Oropharynx is clear and moist. No oropharyngeal exudate.  Eyes: Conjunctivae and EOM are normal. Pupils are equal, round, and reactive to light. Right eye exhibits no discharge. Left eye exhibits no discharge. No scleral icterus.  Neck: Normal range of motion. Neck supple.  Cardiovascular: Normal rate, regular rhythm, normal heart sounds and intact distal pulses.  Exam reveals no gallop and no friction rub.   No murmur heard. Pulmonary/Chest: Effort normal and breath sounds normal. No respiratory distress. He has no wheezes. He has no rales. He exhibits no tenderness.  Abdominal: Soft. He exhibits no distension and no mass. There is no tenderness. There is no rebound and no guarding.  Musculoskeletal: Normal range of motion. He exhibits no edema or tenderness.  Lymphadenopathy:  He has no cervical adenopathy.  Neurological: He is alert and oriented to person, place, and time. No cranial nerve deficit.  Strength 5/5 throughout. No sensory deficits.  No gait abnormality  Skin: Skin is warm and dry. No rash noted. He is not diaphoretic. No erythema. No pallor.  Psychiatric: He has a normal mood and affect. His behavior is normal.  Nursing note and vitals reviewed.   ED Course  Procedures (including  critical care time) Labs Review Labs Reviewed  BASIC METABOLIC PANEL - Abnormal; Notable for the following:    Glucose, Bld 131 (*)    Calcium 8.8 (*)    All other components within normal limits  CBC  I-STAT TROPOININ, ED    Imaging Review Dg Chest 2 View  08/22/2015  CLINICAL DATA:  Chest pain starting last EXAM: CHEST  2 VIEW COMPARISON:  10/25/2012 FINDINGS: The heart size and mediastinal contours are within normal limits. Both lungs are clear. The visualized skeletal structures are unremarkable. IMPRESSION: No active cardiopulmonary disease. Electronically Signed   By: Natasha MeadLiviu  Pop M.D.   On: 08/22/2015 10:36   I have personally reviewed and evaluated these images and lab results as part of my medical decision-making.   EKG Interpretation   Date/Time:  Wednesday August 22 2015 09:21:02 EDT Ventricular Rate:  90 PR Interval:  133 QRS Duration: 95 QT Interval:  340 QTC Calculation: 416 R Axis:   73 Text Interpretation:  Sinus rhythm Low voltage, precordial leads Confirmed  by Donnald GarrePfeiffer, MD, Lebron ConnersMarcy [id number]) on 08/22/2015 9:29:30 AM      MDM   Final diagnoses:  Chest wall pain   34 y.o otherwise healthy M presents with substernal chest pain onset 1 week. Pain worse with movement. Pain does not radiate. No SOB, leg swelling. Pt is recreational smoker. Low HEART score. EKG wnl. Troponin negative. CXR unremarkable. Low suspicion ACS. Repeat trop 0.0. PERC negative.  Chest pain is reproducible on exam. Suspect this pain is musculoskeletal. VSS. Will d/c home with muscle relaxers and NSAID. Recommend PCP follow up. Return precautions outlined in patient discharge instructions.       Lester KinsmanSamantha Tripp RaysalDowless, PA-C 08/23/15 1017  Azalia BilisKevin Campos, MD 08/23/15 [phone]

## 2016-02-07 ENCOUNTER — Ambulatory Visit (HOSPITAL_COMMUNITY)
Admission: EM | Admit: 2016-02-07 | Discharge: 2016-02-07 | Disposition: A | Discharge status: Home / Self Care | Payer: Commercial Managed Care - PPO | Attending: Family Medicine | Admitting: Family Medicine

## 2016-02-07 ENCOUNTER — Encounter (HOSPITAL_COMMUNITY): Payer: Self-pay

## 2016-02-07 DIAGNOSIS — J029 Acute pharyngitis, unspecified: Secondary | ICD-10-CM | POA: Diagnosis not present

## 2016-02-07 DIAGNOSIS — J020 Streptococcal pharyngitis: Secondary | ICD-10-CM | POA: Diagnosis not present

## 2016-02-07 LAB — POCT RAPID STREP A: Streptococcus, Group A Screen (Direct): POSITIVE — AB

## 2016-02-07 MED ORDER — AMOXICILLIN 500 MG PO CAPS: 500.0000 mg | ORAL_CAPSULE | Freq: Three times a day (TID) | ORAL | Status: DC

## 2016-02-07 NOTE — ED Provider Notes (Signed)
CSN: [id number]     Arrival date & time 02/07/16  1945 History   First MD Initiated Contact with Patient 02/07/16 2044     Chief Complaint  Patient presents with  . Sore Throat   (Consider location/radiation/quality/duration/timing/severity/associated sxs/prior Treatment) HPI History obtained from patient:   LOCATION:throat SEVERITY:3 DURATION:1 day CONTEXT:sudden onset this morning, was eating popcorn last night QUALITY:scratchy MODIFYING FACTORS:OTC meds without relief ASSOCIATED SYMPTOMS:hurts to swallow TIMING:now constant  Past Medical History  Diagnosis Date  . Sleep apnea    Past Surgical History  Procedure Laterality Date  . Foot surgery    . Tonsillectomy     No family history on file. Social History  Substance Use Topics  . Smoking status: Current Every Day Smoker    Types: Cigarettes  . Smokeless tobacco: None  . Alcohol Use: Yes     Comment: occasional    Review of Systems Sore throat Allergies  Review of patient's allergies indicates no known allergies.  Home Medications   Prior to Admission medications   Medication Sig Start Date End Date Taking? Authorizing Provider  Aspirin-Acetaminophen-Caffeine (GOODY HEADACHE PO) Take 1 packet by mouth every 8 (eight) hours as needed (for pain).    Historical Provider, MD  cyclobenzaprine (FLEXERIL) 10 MG tablet Take 1 tablet (10 mg total) by mouth 2 (two) times daily as needed for muscle spasms. 08/22/15   Samantha Tripp Dowless, PA-C  diazepam (VALIUM) 5 MG tablet Take 1 tablet (5 mg total) by mouth every 8 (eight) hours as needed (muscle spasm). Patient not taking: Reported on 08/22/2015 10/25/12   Marisa Severinlga Otter, MD  HYDROcodone-acetaminophen (NORCO/VICODIN) 5-325 MG per tablet Take 2 tablets by mouth every 4 (four) hours as needed for pain. Patient not taking: Reported on 08/22/2015 10/25/12   Marisa Severinlga Otter, MD  ibuprofen (ADVIL,MOTRIN) 600 MG tablet Take 1 tablet (600 mg total) by mouth every 6 (six) hours as needed.  08/22/15   Samantha Tripp Dowless, PA-C  ibuprofen (ADVIL,MOTRIN) 800 MG tablet Take 1 tablet (800 mg total) by mouth 3 (three) times daily. Patient not taking: Reported on 08/22/2015 10/25/12   Marisa Severinlga Otter, MD  Multiple Vitamin (MULTIVITAMIN WITH MINERALS) TABS tablet Take 1 tablet by mouth daily.    Historical Provider, MD   Meds Ordered and Administered this Visit  Medications - No data to display  BP 147/91 mmHg  Pulse 100  Temp(Src) 99.1 F (37.3 C) (Oral)  SpO2 100% No data found.   Physical Exam NURSES NOTES AND VITAL SIGNS REVIEWED. CONSTITUTIONAL: Well developed, well nourished, no acute distress HEENT: normocephalic, atraumatic, some throat injection, no exudate EYES: Conjunctiva normal NECK:normal ROM, supple, no adenopathy PULMONARY:No respiratory distress, normal effort MUSCULOSKELETAL: Normal ROM of all extremities,  SKIN: warm and dry without rash PSYCHIATRIC: Mood and affect, behavior are normal  ED Course  Procedures (including critical care time)  Labs Review Labs Reviewed - No data to display  Imaging Review No results found.   Visual Acuity Review  Right Eye Distance:   Left Eye Distance:   Bilateral Distance:    Right Eye Near:   Left Eye Near:    Bilateral Near:         MDM  No diagnosis found.  Patient is reassured that there are no issues that require transfer to higher level of care at this time or additional tests. Patient is advised to continue home symptomatic treatment. Patient is advised that if there are new or worsening symptoms to attend the emergency department, contact  primary care provider, or return to [facility]. Instructions of care provided discharged home in stable condition.    THIS NOTE WAS GENERATED USING A VOICE RECOGNITION SOFTWARE PROGRAM. ALL REASONABLE EFFORTS  WERE MADE TO PROOFREAD THIS DOCUMENT FOR ACCURACY.  I have verbally reviewed the discharge instructions with the patient. A printed AVS was given to the patient.   All questions were answered prior to discharge.      Tharon Aquas, PA 02/07/16 2102  Tharon Aquas, [place] 02/07/16 2115

## 2016-02-07 NOTE — Discharge Instructions (Signed)
Pharyngitis °Pharyngitis is redness, pain, and swelling (inflammation) of your pharynx.  °CAUSES  °Pharyngitis is usually caused by infection. Most of the time, these infections are from viruses (viral) and are part of a cold. However, sometimes pharyngitis is caused by bacteria (bacterial). Pharyngitis can also be caused by allergies. Viral pharyngitis may be spread from person to person by coughing, sneezing, and personal items or utensils (cups, forks, spoons, toothbrushes). Bacterial pharyngitis may be spread from person to person by more intimate contact, such as kissing.  °SIGNS AND SYMPTOMS  °Symptoms of pharyngitis include:   °· Sore throat.   °· Tiredness (fatigue).   °· Low-grade fever.   °· Headache. °· Joint pain and muscle aches. °· Skin rashes. °· Swollen lymph nodes. °· Plaque-like film on throat or tonsils (often seen with bacterial pharyngitis). °DIAGNOSIS  °Your health care provider will ask you questions about your illness and your symptoms. Your medical history, along with a physical exam, is often all that is needed to diagnose pharyngitis. Sometimes, a rapid strep test is done. Other lab tests may also be done, depending on the suspected cause.  °TREATMENT  °Viral pharyngitis will usually get better in 3-4 days without the use of medicine. Bacterial pharyngitis is treated with medicines that kill germs (antibiotics).  °HOME CARE INSTRUCTIONS  °· Drink enough water and fluids to keep your urine clear or pale yellow.   °· Only take over-the-counter or prescription medicines as directed by your health care provider:   °· If you are prescribed antibiotics, make sure you finish them even if you start to feel better.   °· Do not take aspirin.   °· Get lots of rest.   °· Gargle with 8 oz of salt water (½ tsp of salt per 1 qt of water) as often as every 1-2 hours to soothe your throat.   °· Throat lozenges (if you are not at risk for choking) or sprays may be used to soothe your throat. °SEEK MEDICAL  CARE IF:  °· You have large, tender lumps in your neck. °· You have a rash. °· You cough up green, yellow-brown, or bloody spit. °SEEK IMMEDIATE MEDICAL CARE IF:  °· Your neck becomes stiff. °· You drool or are unable to swallow liquids. °· You vomit or are unable to keep medicines or liquids down. °· You have severe pain that does not go away with the use of recommended medicines. °· You have trouble breathing (not caused by a stuffy nose). °MAKE SURE YOU:  °· Understand these instructions. °· Will watch your condition. °· Will get help right away if you are not doing well or get worse. °  °This information is not intended to replace advice given to you by your health care provider. Make sure you discuss any questions you have with your health care provider. °  °Document Released: 10/06/2005 Document Revised: 07/27/2013 Document Reviewed: 06/13/2013 °Elsevier Interactive Patient Education ©2016 [employer] ° °Strep Throat °Strep throat is a bacterial infection of the throat. Your health care provider may call the infection tonsillitis or pharyngitis, depending on whether there is swelling in the tonsils or at the back of the throat. Strep throat is most common during the cold months of the year in children who are 5-15 years of age, but it can happen during any season in people of any age. This infection is spread from person to person (contagious) through coughing, sneezing, or close contact. °CAUSES °Strep throat is caused by the bacteria called Streptococcus pyogenes. °RISK FACTORS °This condition is more likely to   develop in: °· People who spend time in crowded places where the infection can spread easily. °· People who have close contact with someone who has strep throat. °SYMPTOMS °Symptoms of this condition include: °· Fever or chills.   °· Redness, swelling, or pain in the tonsils or throat. °· Pain or difficulty when swallowing. °· White or yellow spots on the tonsils or throat. °· Swollen, tender glands  in the neck or under the jaw. °· Red rash all over the body (rare). °DIAGNOSIS °This condition is diagnosed by performing a rapid strep test or by taking a swab of your throat (throat culture test). Results from a rapid strep test are usually ready in a few minutes, but throat culture test results are available after one or two days. °TREATMENT °This condition is treated with antibiotic medicine. °HOME CARE INSTRUCTIONS °Medicines °· Take over-the-counter and prescription medicines only as told by your health care provider. °· Take your antibiotic as told by your health care provider. Do not stop taking the antibiotic even if you start to feel better. °· Have family members who also have a sore throat or fever tested for strep throat. They may need antibiotics if they have the strep infection. °Eating and Drinking °· Do not share food, drinking cups, or personal items that could cause the infection to spread to other people. °· If swallowing is difficult, try eating soft foods until your sore throat feels better. °· Drink enough fluid to keep your urine clear or pale yellow. °General Instructions °· Gargle with a salt-water mixture 3-4 times per day or as needed. To make a salt-water mixture, completely dissolve ½-1 tsp of salt in 1 cup of warm water. °· Make sure that all household members wash their hands well. °· Get plenty of rest. °· Stay home from school or work until you have been taking antibiotics for 24 hours. °· Keep all follow-up visits as told by your health care provider. This is important. °SEEK MEDICAL CARE IF: °· The glands in your neck continue to get bigger. °· You develop a rash, cough, or earache. °· You cough up a thick liquid that is green, yellow-brown, or bloody. °· You have pain or discomfort that does not get better with medicine. °· Your problems seem to be getting worse rather than better. °· You have a fever. °SEEK IMMEDIATE MEDICAL CARE IF: °· You have new symptoms, such as vomiting,  severe headache, stiff or painful neck, chest pain, or shortness of breath. °· You have severe throat pain, drooling, or changes in your voice. °· You have swelling of the neck, or the skin on the neck becomes red and tender. °· You have signs of dehydration, such as fatigue, dry mouth, and decreased urination. °· You become increasingly sleepy, or you cannot wake up completely. °· Your joints become red or painful. °  °This information is not intended to replace advice given to you by your health care provider. Make sure you discuss any questions you have with your health care provider. °  °Document Released: 10/03/2000 Document Revised: 06/27/2015 Document Reviewed: 01/29/2015 °Elsevier Interactive Patient Education ©2016 Elsevier Inc. ° °

## 2016-02-07 NOTE — ED Notes (Signed)
Patient states he has had a sore throat since this morning he has taken alka seltzer and gargled mouth wash to relieve pain and has not worked still in pain. No acute distress

## 2016-10-05 ENCOUNTER — Emergency Department (HOSPITAL_COMMUNITY): Payer: Self-pay

## 2016-10-05 ENCOUNTER — Emergency Department (HOSPITAL_COMMUNITY)
Admission: EM | Admit: 2016-10-05 | Discharge: 2016-10-05 | Disposition: A | Discharge status: Home / Self Care | Payer: Self-pay | Attending: Emergency Medicine | Admitting: Emergency Medicine

## 2016-10-05 DIAGNOSIS — F17210 Nicotine dependence, cigarettes, uncomplicated: Secondary | ICD-10-CM | POA: Insufficient documentation

## 2016-10-05 DIAGNOSIS — J029 Acute pharyngitis, unspecified: Secondary | ICD-10-CM | POA: Insufficient documentation

## 2016-10-05 DIAGNOSIS — R05 Cough: Secondary | ICD-10-CM

## 2016-10-05 DIAGNOSIS — J069 Acute upper respiratory infection, unspecified: Secondary | ICD-10-CM | POA: Insufficient documentation

## 2016-10-05 DIAGNOSIS — R059 Cough, unspecified: Secondary | ICD-10-CM

## 2016-10-05 LAB — RAPID STREP SCREEN (MED CTR MEBANE ONLY): Streptococcus, Group A Screen (Direct): NEGATIVE

## 2016-10-05 MED ORDER — DEXTROMETHORPHAN-GUAIFENESIN 5-100 MG/5ML PO LIQD: 5.0000 mL | Freq: Two times a day (BID) | ORAL | 0 refills | Status: DC

## 2016-10-05 MED ORDER — IBUPROFEN 600 MG PO TABS: 600.0000 mg | ORAL_TABLET | Freq: Four times a day (QID) | ORAL | 0 refills | Status: DC | PRN

## 2016-10-05 NOTE — Discharge Instructions (Signed)
Your chest x-ray and strep tests are all within normal parameters give a viral infection and not requiring antibiotics he can safely take Delsym ibuprofen and Tylenol or your symptom relief

## 2016-10-05 NOTE — ED Notes (Signed)
Pt c/o cough for past week. Also has a sore and swollen throat which has gotten worse over past 2 days. Girlfriend has recently had strep throat .

## 2016-10-05 NOTE — ED Provider Notes (Signed)
[facility]-EMERGENCY DEPT Provider Note   CSN: [id number] Arrival date & time: 10/05/16  0156     History   Chief Complaint Chief Complaint  Patient presents with  . Cough  . Sore Throat    HPI Jorge Allen is a 35 y.o. male.  This is a 35 year old male with several days of sore throat and difficulty swallowing and cough yesterday he tried taking some Mucinex without relief has a known strep exposure      Past Medical History:  Diagnosis Date  . Sleep apnea     Patient Active Problem List   Diagnosis Date Noted  . NONSPEC ELEVATION OF LEVELS OF TRANSAMINASE/LDH 12/21/2007  . TOBACCO USE 12/17/2007  . GERD 12/17/2007    Past Surgical History:  Procedure Laterality Date  . FOOT SURGERY    . TONSILLECTOMY         Home Medications    Prior to Admission medications   Medication Sig Start Date End Date Taking? Authorizing Provider  amoxicillin (AMOXIL) 500 MG capsule Take 1 capsule (500 mg total) by mouth 3 (three) times daily. 02/07/16   Tharon AquasFrank C Patrick, PA  Aspirin-Acetaminophen-Caffeine (GOODY HEADACHE PO) Take 1 packet by mouth every 8 (eight) hours as needed (for pain).    Historical Provider, MD  cyclobenzaprine (FLEXERIL) 10 MG tablet Take 1 tablet (10 mg total) by mouth 2 (two) times daily as needed for muscle spasms. 08/22/15   Samantha Tripp Dowless, PA-C  Dextromethorphan-Guaifenesin (DELSYM COUGH/CHEST CONGEST DM) 5-100 MG/5ML LIQD Take 5 mLs by mouth 2 (two) times daily. 10/05/16   Earley FavorGail Karanveer Ramakrishnan, NP  diazepam (VALIUM) 5 MG tablet Take 1 tablet (5 mg total) by mouth every 8 (eight) hours as needed (muscle spasm). Patient not taking: Reported on 08/22/2015 10/25/12   Marisa Severinlga Otter, MD  HYDROcodone-acetaminophen (NORCO/VICODIN) 5-325 MG per tablet Take 2 tablets by mouth every 4 (four) hours as needed for pain. Patient not taking: Reported on 08/22/2015 10/25/12   Marisa Severinlga Otter, MD  ibuprofen (ADVIL,MOTRIN) 600 MG tablet Take 1 tablet (600 mg total) by mouth every 6 (six)  hours as needed. 10/05/16   Earley FavorGail Rayah Fines, NP  Multiple Vitamin (MULTIVITAMIN WITH MINERALS) TABS tablet Take 1 tablet by mouth daily.    Historical Provider, MD    Family History No family history on file.  Social History Social History  Substance Use Topics  . Smoking status: Current Every Day Smoker    Types: Cigarettes  . Smokeless tobacco: Not on file  . Alcohol use Yes     Comment: occasional     Allergies   Patient has no known allergies.   Review of Systems Review of Systems  Constitutional: Negative for chills and fever.  HENT: Positive for congestion, sore throat and trouble swallowing.   Respiratory: Positive for cough. Negative for shortness of breath.   Gastrointestinal: Negative for abdominal pain.  Neurological: Negative for dizziness and headaches.  All other systems reviewed and are negative.    Physical Exam Updated Vital Signs BP 129/66 (BP Location: Left Arm)   Pulse 94   Temp 98.6 F (37 C) (Oral)   Resp 20   Ht 5\' 5"  (1.651 m)   Wt 133.8 kg   SpO2 99%   BMI 49.09 kg/m   Physical Exam  Constitutional: He appears well-developed and well-nourished. No distress.  HENT:  Head: Normocephalic.  Right Ear: External ear normal.  Left Ear: External ear normal.  Mouth/Throat: Uvula swelling present. Posterior oropharyngeal edema and posterior oropharyngeal  erythema present. No oropharyngeal exudate or tonsillar abscesses.  Cardiovascular: Normal rate.   Pulmonary/Chest: Effort normal.  Abdominal: Soft.  Musculoskeletal: Normal range of motion.  Neurological: He is alert.  Skin: Skin is warm.  Nursing note and vitals reviewed.    ED Treatments / Results  Labs (all labs ordered are listed, but only abnormal results are displayed) Labs Reviewed  RAPID STREP SCREEN (NOT [facility])  CULTURE, GROUP A STREP [facility])    EKG  EKG Interpretation None       Radiology Dg Chest 2 View  Result Date: 10/05/2016 CLINICAL DATA:  35 year old male  with cough EXAM: CHEST  2 VIEW COMPARISON:  Chest radiograph dated 08/22/2015 FINDINGS: The heart size and mediastinal contours are within normal limits. Both lungs are clear. The visualized skeletal structures are unremarkable. IMPRESSION: No active cardiopulmonary disease. Electronically Signed   By: Elgie CollardArash  Radparvar M.D.   On: 10/05/2016 04:21    Procedures Procedures (including critical care time)  Medications Ordered in ED Medications - No data to display   Initial Impression / Assessment and Plan / ED Course  I have reviewed the triage vital signs and the nursing notes.  Pertinent labs & imaging results that were available during my care of the patient were reviewed by me and considered in my medical decision making (see chart for details).  Clinical Course        Final Clinical Impressions(s) / ED Diagnoses   Final diagnoses:  Pharyngitis, unspecified etiology  Viral upper respiratory tract infection  Cough    New Prescriptions Discharge Medication List as of 10/05/2016  4:49 AM    START taking these medications   Details  Dextromethorphan-Guaifenesin (DELSYM COUGH/CHEST CONGEST DM) 5-100 MG/5ML LIQD Take 5 mLs by mouth 2 (two) times daily., Starting Sun 10/05/2016, Print         Earley FavorGail Shavanna Furnari, NP 10/05/16 2000    Zadie Rhineonald Wickline, MD 10/05/16 [phone]

## 2016-10-07 LAB — CULTURE, GROUP A STREP (THRC)

## 2016-11-29 ENCOUNTER — Encounter (HOSPITAL_COMMUNITY): Payer: Self-pay | Admitting: *Deleted

## 2016-11-29 DIAGNOSIS — Z79899 Other long term (current) drug therapy: Secondary | ICD-10-CM | POA: Insufficient documentation

## 2016-11-29 DIAGNOSIS — F17210 Nicotine dependence, cigarettes, uncomplicated: Secondary | ICD-10-CM | POA: Insufficient documentation

## 2016-11-29 DIAGNOSIS — L03317 Cellulitis of buttock: Secondary | ICD-10-CM | POA: Insufficient documentation

## 2016-11-29 NOTE — ED Triage Notes (Signed)
PT states several years ago he had a pilonidal cyst that was drained. He says he has always had a knot in that area but over the past two days, has become painful and draining.Denies fevers.  He has been putting tea tree oil on the area. Difficulty sitting d/t the pain

## 2016-11-30 ENCOUNTER — Encounter (HOSPITAL_COMMUNITY): Payer: Self-pay | Admitting: Emergency Medicine

## 2016-11-30 ENCOUNTER — Emergency Department (HOSPITAL_COMMUNITY)
Admission: EM | Admit: 2016-11-30 | Discharge: 2016-11-30 | Disposition: A | Discharge status: Home / Self Care | Payer: Self-pay | Attending: Emergency Medicine | Admitting: Emergency Medicine

## 2016-11-30 DIAGNOSIS — L0390 Cellulitis, unspecified: Secondary | ICD-10-CM

## 2016-11-30 MED ORDER — NAPROXEN 500 MG PO TABS: 500.0000 mg | ORAL_TABLET | Freq: Two times a day (BID) | ORAL | 0 refills | Status: DC

## 2016-11-30 MED ORDER — ACETAMINOPHEN 500 MG PO TABS
1000.0000 mg | ORAL_TABLET | Freq: Once | ORAL | Status: AC
  Administered 2016-11-30: 1000 mg via ORAL
  Filled 2016-11-30: qty 2

## 2016-11-30 MED ORDER — CEPHALEXIN 500 MG PO CAPS
500.0000 mg | ORAL_CAPSULE | Freq: Four times a day (QID) | ORAL | 0 refills | Status: DC
Start: 2016-11-30 — End: 2017-03-24

## 2016-11-30 MED ORDER — KETOROLAC TROMETHAMINE 60 MG/2ML IM SOLN
60.0000 mg | Freq: Once | INTRAMUSCULAR | Status: AC
  Administered 2016-11-30: 60 mg via INTRAMUSCULAR
  Filled 2016-11-30: qty 2

## 2016-11-30 MED ORDER — CEPHALEXIN 500 MG PO CAPS
500.0000 mg | ORAL_CAPSULE | Freq: Once | ORAL | Status: AC
  Administered 2016-11-30: 500 mg via ORAL
  Filled 2016-11-30: qty 1

## 2016-11-30 MED ORDER — TRAMADOL HCL 50 MG PO TABS
50.0000 mg | ORAL_TABLET | Freq: Once | ORAL | Status: AC
  Administered 2016-11-30: 50 mg via ORAL
  Filled 2016-11-30: qty 1

## 2016-11-30 MED ORDER — SULFAMETHOXAZOLE-TRIMETHOPRIM 800-160 MG PO TABS
1.0000 | ORAL_TABLET | Freq: Once | ORAL | Status: AC
  Administered 2016-11-30: 1 via ORAL
  Filled 2016-11-30: qty 1

## 2016-11-30 MED ORDER — SULFAMETHOXAZOLE-TRIMETHOPRIM 800-160 MG PO TABS: 1.0000 | ORAL_TABLET | Freq: Two times a day (BID) | ORAL | 0 refills | Status: AC

## 2016-11-30 NOTE — ED Provider Notes (Signed)
[facility]-EMERGENCY DEPT Provider Note   CSN: [id number] Arrival date & time: 11/29/16  2132 By signing my name below, I, Levon Hedger, attest that this documentation has been prepared under the direction and in the presence of Aime Carreras, MD . Electronically Signed: Levon Hedger, Scribe. 11/30/2016. 1:15 AM.   History   Chief Complaint Chief Complaint  Patient presents with  . Abscess   HPI Comments: Jorge Allen is a 36 y.o. male who presents to the Emergency Department complaining of a moderate, gradually worsening area of pain and swelling to the left gluteal cleft onset two days ago. Pt states pain is exacerbated by direct pressure. He endorses "pink juice" drainiage from the area. No medications taken PTA. Per triage note, pt has applied tea tree oil with no relief of pain. This is a recurrent problem. Per pt, he had a pilonidal cyst lanced in 2015. He denies fever or chills. Pt has no other complaints or symptoms at this time.   The history is provided by the patient and medical records. No language interpreter was used.  Abscess  Abscess location: gluteal cleft  Abscess quality: draining, induration and painful   Red streaking: no   Duration:  2 days Progression:  Worsening Pain details:    Quality:  Dull   Severity:  Moderate   Timing:  Constant Chronicity:  Recurrent Relieved by:  Nothing Exacerbated by: direct pressure. Ineffective treatments: tea tree oil. Associated symptoms: no fever   Risk factors: prior abscess   Risk factors: no hx of MRSA     Past Medical History:  Diagnosis Date  . Sleep apnea     Patient Active Problem List   Diagnosis Date Noted  . NONSPEC ELEVATION OF LEVELS OF TRANSAMINASE/LDH 12/21/2007  . TOBACCO USE 12/17/2007  . GERD 12/17/2007    Past Surgical History:  Procedure Laterality Date  . FOOT SURGERY    . TONSILLECTOMY      Home Medications    Prior to Admission medications   Medication Sig Start Date End Date  Taking? Authorizing Provider  Dextromethorphan-Guaifenesin (DELSYM COUGH/CHEST CONGEST DM) 5-100 MG/5ML LIQD Take 5 mLs by mouth 2 (two) times daily. Patient not taking: Reported on 11/30/2016 10/05/16   Earley Favor, NP  ibuprofen (ADVIL,MOTRIN) 600 MG tablet Take 1 tablet (600 mg total) by mouth every 6 (six) hours as needed. Patient not taking: Reported on 11/30/2016 10/05/16   Earley Favor, NP    Family History No family history on file.  Social History Social History  Substance Use Topics  . Smoking status: Current Every Day Smoker    Types: Cigarettes  . Smokeless tobacco: Current User  . Alcohol use Yes     Comment: occasional     Allergies   Patient has no known allergies.  Review of Systems Review of Systems  Constitutional: Negative for chills and fever.  Skin: Positive for color change and wound.  All other systems reviewed and are negative.  Physical Exam Updated Vital Signs BP 129/76 (BP Location: Left Arm)   Pulse 105   Temp 99.9 F (37.7 C) (Oral)   Resp 16   SpO2 100%   Physical Exam  Constitutional: He is oriented to person, place, and time. He appears well-developed and well-nourished. No distress.  HENT:  Head: Normocephalic and atraumatic.  Mouth/Throat: Oropharynx is clear and moist. No oropharyngeal exudate.  Moist mucous membranes   Eyes: Conjunctivae and EOM are normal. Pupils are equal, round, and reactive to light.  Pinpoint  pupils  Neck: Normal range of motion. Neck supple. No JVD present.  Trachea midline No bruit  Cardiovascular: Normal rate, regular rhythm and normal heart sounds.   Pulmonary/Chest: Effort normal and breath sounds normal. No stridor. No respiratory distress.  Abdominal: Soft. Bowel sounds are normal. He exhibits no distension and no mass. There is no tenderness. There is no rebound and no guarding.  Musculoskeletal: Normal range of motion.  Neurological: He is alert and oriented to person, place, and time. He has normal  reflexes.  Skin: Skin is warm and dry. Capillary refill takes less than 2 seconds.  Cellulitis of the gluteal cleft, redness and warmth.   Psychiatric: He has a normal mood and affect. His behavior is normal.  Nursing note and vitals reviewed.   ED Treatments / Results   Vitals:   11/30/16 0108 11/30/16 0256  BP: 114/63 (!) 111/47  Pulse: 103 111  Resp: 16 18  Temp:      DIAGNOSTIC STUDIES:  Oxygen Saturation is 100% on RA, normal by my interpretation.    COORDINATION OF CARE:  1:10 AM Discussed treatment plan with pt at bedside and pt agreed to plan.     Procedures Procedures (including critical care time)  Medications Ordered in ED  Medications  ketorolac (TORADOL) injection 60 mg (60 mg Intramuscular Given 11/30/16 0156)  sulfamethoxazole-trimethoprim (BACTRIM DS,SEPTRA DS) 800-160 MG per tablet 1 tablet (1 tablet Oral Given 11/30/16 0156)  cephALEXin (KEFLEX) capsule 500 mg (500 mg Oral Given 11/30/16 0156)  acetaminophen (TYLENOL) tablet 1,000 mg (1,000 mg Oral Given 11/30/16 0156)     Initial Impression / Assessment and Plan / ED Course   Cellulitis: no abscess at this time will need marsupilization by surgery for recurrence.  Take all antibiotics do sitz baths and follow up with surgery.  All questions answered to patient's satisfaction. Based on history and exam patient has been appropriately medically screened and emergency conditions excluded. Patient is stable for discharge at this time. Strict return precautions given for any further episodes, persistent fever, weakness or any concerns.  Final Clinical Impressions(s) / ED Diagnoses   Final diagnoses:  None    I personally performed the services described in this documentation, which was scribed in my presence. The recorded information has been reviewed and is accurate.      Cy BlamerApril Jemia Fata, MD 11/30/16 [phone]

## 2016-12-01 ENCOUNTER — Encounter (HOSPITAL_COMMUNITY): Payer: Self-pay | Admitting: *Deleted

## 2016-12-01 ENCOUNTER — Emergency Department (HOSPITAL_COMMUNITY)
Admission: EM | Admit: 2016-12-01 | Discharge: 2016-12-01 | Disposition: A | Discharge status: Home / Self Care | Payer: Self-pay | Attending: Emergency Medicine | Admitting: Emergency Medicine

## 2016-12-01 DIAGNOSIS — L0291 Cutaneous abscess, unspecified: Secondary | ICD-10-CM

## 2016-12-01 DIAGNOSIS — F17210 Nicotine dependence, cigarettes, uncomplicated: Secondary | ICD-10-CM | POA: Insufficient documentation

## 2016-12-01 DIAGNOSIS — L0591 Pilonidal cyst without abscess: Secondary | ICD-10-CM | POA: Insufficient documentation

## 2016-12-01 DIAGNOSIS — Z79899 Other long term (current) drug therapy: Secondary | ICD-10-CM | POA: Insufficient documentation

## 2016-12-01 MED ORDER — LIDOCAINE-EPINEPHRINE (PF) 2 %-1:200000 IJ SOLN
10.0000 mL | Freq: Once | INTRAMUSCULAR | Status: AC
  Administered 2016-12-01: 10 mL
  Filled 2016-12-01: qty 20

## 2016-12-01 MED ORDER — IBUPROFEN 800 MG PO TABS
800.0000 mg | ORAL_TABLET | Freq: Once | ORAL | Status: AC
  Administered 2016-12-01: 800 mg via ORAL
  Filled 2016-12-01: qty 1

## 2016-12-01 NOTE — Discharge Instructions (Signed)
It was my pleasure taking care of you today! Please continue taking antibiotics until completion. Continue naproxen and sitz baths as needed at home.  Please call the surgery clinic today to schedule a follow up appointment. You will need a wound check in 2-3 days to ensure proper healing. If for some reason you cannot get an appointment with surgery in 2-3 days, return to ER for wound check. Return to ER for fever, spreading infection, new or worsening symptoms or any additional concerns.

## 2016-12-01 NOTE — ED Provider Notes (Signed)
[facility]-EMERGENCY DEPT Provider Note   CSN: [id number] Arrival date & time: 12/01/16  0002     History   Chief Complaint Chief Complaint  Patient presents with  . Cyst    pilonidal cyst     HPI [place] L Terrilee CroakKnight is a 36 y.o. male.  HPI   Darryon L Terrilee CroakKnight is a 36 y.o. male  who presents to the Emergency Department complaining of gradually worsening area of pain swelling to middle of buttocks x 2-3 days. He states pain is much worse with pressure and he is now unable to sit down due to pain. [facility]. He states that the provider at that time told him there was nothing to drain but he needed antibiotics. He was started on Bactrim and Keflex and has taken one full day course of both. He took Naproxen for pain with little relief. He states that the area has been draining pus all day and pain is worsening, therefore he came to the ER again today. No fever or chills.    Past Medical History:  Diagnosis Date  . Sleep apnea     Patient Active Problem List   Diagnosis Date Noted  . NONSPEC ELEVATION OF LEVELS OF TRANSAMINASE/LDH 12/21/2007  . TOBACCO USE 12/17/2007  . GERD 12/17/2007    Past Surgical History:  Procedure Laterality Date  . FOOT SURGERY    . TONSILLECTOMY         Home Medications    Prior to Admission medications   Medication Sig Start Date End Date Taking? Authorizing Provider  cephALEXin (KEFLEX) 500 MG capsule Take 1 capsule (500 mg total) by mouth 4 (four) times daily. 11/30/16   April Palumbo, MD  Dextromethorphan-Guaifenesin (DELSYM COUGH/CHEST CONGEST DM) 5-100 MG/5ML LIQD Take 5 mLs by mouth 2 (two) times daily. Patient not taking: Reported on 11/30/2016 10/05/16   Earley FavorGail Schulz, NP  ibuprofen (ADVIL,MOTRIN) 600 MG tablet Take 1 tablet (600 mg total) by mouth every 6 (six) hours as needed. Patient not taking: Reported on 11/30/2016 10/05/16   Earley FavorGail Schulz, NP  naproxen (NAPROSYN) 500 MG tablet Take 1 tablet (500 mg total) by  mouth 2 (two) times daily. 11/30/16   April Palumbo, MD  sulfamethoxazole-trimethoprim (BACTRIM DS,SEPTRA DS) 800-160 MG tablet Take 1 tablet by mouth 2 (two) times daily. 11/30/16 12/07/16  April Palumbo, MD    Family History No family history on file.  Social History Social History  Substance Use Topics  . Smoking status: Current Every Day Smoker    Types: Cigarettes  . Smokeless tobacco: Current User  . Alcohol use Yes     Comment: occasional     Allergies   Patient has no known allergies.   Review of Systems Review of Systems  Constitutional: Negative for chills and fever.  Gastrointestinal: Negative for abdominal pain, blood in stool, constipation, diarrhea, nausea and vomiting.  Skin: Positive for wound.     Physical Exam Updated Vital Signs BP 135/78 (BP Location: Right Arm)   Pulse 96   Temp 98.1 F (36.7 C) (Oral)   Resp 16   SpO2 98%   Physical Exam  Constitutional: He is oriented to person, place, and time. He appears well-developed and well-nourished. No distress.  HENT:  Head: Normocephalic and atraumatic.  Cardiovascular: Normal rate, regular rhythm and normal heart sounds.   No murmur heard. Pulmonary/Chest: Effort normal and breath sounds normal. No respiratory distress.  Abdominal: Soft. He exhibits no distension. There is  no tenderness.  Musculoskeletal: He exhibits no edema.  Neurological: He is alert and oriented to person, place, and time.  Skin: Skin is warm and dry.     Nursing note and vitals reviewed.    ED Treatments / Results  Labs (all labs ordered are listed, but only abnormal results are displayed) Labs Reviewed - No data to display  EKG  EKG Interpretation None       Radiology No results found.  Procedures Procedures (including critical care time)  INCISION AND DRAINAGE Performed by: Chase Picket Irelynd Zumstein Consent: Verbal consent obtained. Risks and benefits: risks, benefits and alternatives were discussed Type:  abscess Body area:  Anesthesia: local infiltration Incision was made with a scalpel. Local anesthetic: lidocaine 2% with epinephrine Anesthetic total: 3 ml Complexity: complex Blunt dissection to break up loculations Drainage: purulent Drainage amount: moderate Packing material: 1/4 in iodoform gauze Patient tolerance: Patient tolerated the procedure well with no immediate complications.     Medications Ordered in ED Medications  lidocaine-EPINEPHrine (XYLOCAINE W/EPI) 2 %-1:200000 (PF) injection 10 mL (10 mLs Infiltration Given 12/01/16 0237)     Initial Impression / Assessment and Plan / ED Course  I have reviewed the triage vital signs and the nursing notes.  Pertinent labs & imaging results that were available during my care of the patient were reviewed by me and considered in my medical decision making (see chart for details).     Baraa L Budhu presents to ED with abscess actively draining purulent discharge. Incision and drainage performed. He was prescribed Bactrim and Keflex yesterday and I strongly encouraged him to continue taking ABX until completion. Given location and recurrence, strongly suggested that he call surgery and follow up with them. Wound check in 2 days if he is unable to follow up with surgery in this timeframe. Return to ER if concern for spread of infection, increasing pain, fevers, or other concerns. All questions answered.    Final Clinical Impressions(s) / ED Diagnoses   Final diagnoses:  None    New Prescriptions New Prescriptions   No medications on file     [facility] Nica Friske, PA-C 12/01/16 9562    Dione Booze, MD 12/01/16 [phone]

## 2016-12-01 NOTE — ED Triage Notes (Signed)
Pt has pilonidal cyst on left buttock.  Painful and draining at this time.  [facility] and it was not lanced and he was told to follow up.  Pt continues to have pain and the area is draining at this time.

## 2017-03-24 ENCOUNTER — Emergency Department (HOSPITAL_BASED_OUTPATIENT_CLINIC_OR_DEPARTMENT_OTHER)
Admission: EM | Admit: 2017-03-24 | Discharge: 2017-03-24 | Disposition: A | Discharge status: Home / Self Care | Payer: Self-pay | Attending: Emergency Medicine | Admitting: Emergency Medicine

## 2017-03-24 ENCOUNTER — Emergency Department (HOSPITAL_BASED_OUTPATIENT_CLINIC_OR_DEPARTMENT_OTHER): Payer: Self-pay

## 2017-03-24 ENCOUNTER — Encounter (HOSPITAL_BASED_OUTPATIENT_CLINIC_OR_DEPARTMENT_OTHER): Payer: Self-pay | Admitting: *Deleted

## 2017-03-24 DIAGNOSIS — J209 Acute bronchitis, unspecified: Secondary | ICD-10-CM | POA: Insufficient documentation

## 2017-03-24 DIAGNOSIS — F17210 Nicotine dependence, cigarettes, uncomplicated: Secondary | ICD-10-CM | POA: Insufficient documentation

## 2017-03-24 DIAGNOSIS — J42 Unspecified chronic bronchitis: Secondary | ICD-10-CM | POA: Insufficient documentation

## 2017-03-24 MED ORDER — DOXYCYCLINE HYCLATE 100 MG PO CAPS: 100.0000 mg | ORAL_CAPSULE | Freq: Two times a day (BID) | ORAL | 0 refills | Status: DC

## 2017-03-24 MED ORDER — ALBUTEROL SULFATE HFA 108 (90 BASE) MCG/ACT IN AERS
2.0000 | INHALATION_SPRAY | RESPIRATORY_TRACT | Status: DC | PRN
  Administered 2017-03-24: 2 via RESPIRATORY_TRACT
  Filled 2017-03-24: qty 6.7

## 2017-03-24 MED ORDER — DOXYCYCLINE HYCLATE 100 MG PO TABS
100.0000 mg | ORAL_TABLET | Freq: Once | ORAL | Status: AC
  Administered 2017-03-24: 100 mg via ORAL
  Filled 2017-03-24: qty 1

## 2017-03-24 NOTE — ED Provider Notes (Addendum)
[facility] Provider Note: Lowella DellJ. Lane Kaleiah Kutzer, MD, FACEP  CSN: [id number] MRN: [id number] ARRIVAL: 03/24/17 at 0123 ROOM: [facility]11/[facility]11   CHIEF COMPLAINT  Cough   HISTORY OF PRESENT ILLNESS  Jorge Allen is a 36 y.o. male with a five-day history of cough. The cough is worsening and has been productive of yellow sputum as well as streaks of bright red blood. He is having some mild chest burning with his cough. He denies associated fever, chills, nausea, vomiting, diarrhea, malaise, shortness of breath or wheezing. He has been taking Mucinex without adequate relief.   Past Medical History:  Diagnosis Date  . Sleep apnea     Past Surgical History:  Procedure Laterality Date  . FOOT SURGERY    . TONSILLECTOMY      No family history on file.  Social History  Substance Use Topics  . Smoking status: Current Every Day Smoker    Types: Cigarettes  . Smokeless tobacco: Current User  . Alcohol use Yes     Comment: occasional    Prior to Admission medications   Medication Sig Start Date End Date Taking? Authorizing Provider  doxycycline (VIBRAMYCIN) 100 MG capsule Take 1 capsule (100 mg total) by mouth 2 (two) times daily. One po bid x 7 days 03/24/17   Joetta Delprado, Jorge RuizJohn, MD    Allergies Patient has no known allergies.   REVIEW OF SYSTEMS  Negative except as noted here or in the History of Present Illness.   PHYSICAL EXAMINATION  Initial Vital Signs Blood pressure (!) 105/54, pulse 82, temperature 98 F (36.7 C), temperature source Oral, height 5\' 5"  (1.651 m), weight 128.4 kg (283 lb), SpO2 96 %.  Examination General: Well-developed, well-nourished male in no acute distress; appearance consistent with age of record HENT: normocephalic; atraumatic Eyes: pupils equal, round and reactive to light; extraocular muscles intact Neck: supple Heart: regular rate and rhythm Lungs: Decreased air movement bilaterally without wheezing; frequent cough Abdomen: soft;  nondistended; nontender; bowel sounds present Extremities: No deformity; full range of motion; pulses normal Neurologic: Awake, alert and oriented; motor function intact in all extremities and symmetric; no facial droop Skin: Warm and dry Psychiatric: Normal mood and affect   RESULTS  Summary of this visit's results, reviewed by myself:   EKG Interpretation  Date/Time:    Ventricular Rate:    PR Interval:    QRS Duration:   QT Interval:    QTC Calculation:   R Axis:     Text Interpretation:        Laboratory Studies: No results found for this or any previous visit (from the past 24 hour(s)). Imaging Studies: Dg Chest 2 View  Result Date: 03/24/2017 CLINICAL DATA:  Cough with yellow on blood tinged sputum x4 days. History of smoking. EXAM: CHEST  2 VIEW COMPARISON:  None. FINDINGS: The heart size and mediastinal contours are within normal limits. Perihilar increase in interstitial lung markings consistent with chronic bronchitic change. Minimal superimposed upper lobe airspace opacities are noted consistent with pneumonia. The visualized skeletal structures are unremarkable. IMPRESSION: Chronic bronchitic change of the lungs with minimal superimposed upper lobe airspace disease suspicious for superimposed pneumonia. Electronically Signed   By: Tollie Ethavid  Kwon M.D.   On: 03/24/2017 02:38    ED COURSE  Nursing notes and initial vitals signs, including pulse oximetry, reviewed.  Vitals:   03/24/17 0134  BP: (!) 105/54  Pulse: 82  Temp: 98 F (36.7 C)  TempSrc: Oral  SpO2: 96%  Weight: 128.4  kg (283 lb)  Height: 5\' 5"  (1.651 m)   2:54 AM We'll treat for possible early pneumonia. Patient advised of x-ray showing evidence of chronic bronchitis and the need for him to discontinue smoking.  PROCEDURES    ED DIAGNOSES     ICD-9-CM ICD-10-CM   1. Chronic bronchitis with acute exacerbation (HCC) 466.0 J20.9    491.9 J42        Kamalei Roeder, Jorge Ruiz, MD 03/24/17 0253    Paula Libra, MD 03/24/17 [phone]

## 2017-03-24 NOTE — ED Triage Notes (Signed)
C/o cough x 5 days productive yellow and bloody,  Red  Denies pain

## 2017-04-03 ENCOUNTER — Emergency Department: Payer: No Typology Code available for payment source

## 2017-04-03 ENCOUNTER — Encounter: Payer: Self-pay | Admitting: Emergency Medicine

## 2017-04-03 ENCOUNTER — Other Ambulatory Visit: Payer: Self-pay

## 2017-04-03 ENCOUNTER — Emergency Department
Admission: EM | Admit: 2017-04-03 | Discharge: 2017-04-04 | Disposition: A | Discharge status: Home / Self Care | Payer: No Typology Code available for payment source | Attending: Emergency Medicine | Admitting: Emergency Medicine

## 2017-04-03 DIAGNOSIS — Z23 Encounter for immunization: Secondary | ICD-10-CM | POA: Insufficient documentation

## 2017-04-03 DIAGNOSIS — Y939 Activity, unspecified: Secondary | ICD-10-CM | POA: Diagnosis not present

## 2017-04-03 DIAGNOSIS — F17210 Nicotine dependence, cigarettes, uncomplicated: Secondary | ICD-10-CM | POA: Diagnosis not present

## 2017-04-03 DIAGNOSIS — Y92411 Interstate highway as the place of occurrence of the external cause: Secondary | ICD-10-CM | POA: Insufficient documentation

## 2017-04-03 DIAGNOSIS — S92214A Nondisplaced fracture of cuboid bone of right foot, initial encounter for closed fracture: Secondary | ICD-10-CM | POA: Insufficient documentation

## 2017-04-03 DIAGNOSIS — Y999 Unspecified external cause status: Secondary | ICD-10-CM | POA: Insufficient documentation

## 2017-04-03 DIAGNOSIS — T07XXXA Unspecified multiple injuries, initial encounter: Secondary | ICD-10-CM | POA: Diagnosis present

## 2017-04-03 DIAGNOSIS — M791 Myalgia: Secondary | ICD-10-CM | POA: Diagnosis not present

## 2017-04-03 DIAGNOSIS — M7918 Myalgia, other site: Secondary | ICD-10-CM

## 2017-04-03 MED ORDER — CEPHALEXIN 500 MG PO CAPS
500.0000 mg | ORAL_CAPSULE | Freq: Once | ORAL | Status: AC
  Administered 2017-04-03: 500 mg via ORAL
  Filled 2017-04-03: qty 1

## 2017-04-03 MED ORDER — OXYCODONE-ACETAMINOPHEN 5-325 MG PO TABS
1.0000 | ORAL_TABLET | Freq: Once | ORAL | Status: AC
  Administered 2017-04-03: 1 via ORAL
  Filled 2017-04-03: qty 1

## 2017-04-03 MED ORDER — TETANUS-DIPHTH-ACELL PERTUSSIS 5-2.5-18.5 LF-MCG/0.5 IM SUSP
0.5000 mL | Freq: Once | INTRAMUSCULAR | Status: AC
  Administered 2017-04-03: 0.5 mL via INTRAMUSCULAR
  Filled 2017-04-03: qty 0.5
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
## 2017-04-04 ENCOUNTER — Emergency Department (HOSPITAL_COMMUNITY)
Admission: EM | Admit: 2017-04-04 | Discharge: 2017-04-04 | Disposition: A | Discharge status: Home / Self Care | Payer: Self-pay | Attending: Emergency Medicine | Admitting: Emergency Medicine

## 2017-04-04 ENCOUNTER — Encounter (HOSPITAL_COMMUNITY): Payer: Self-pay

## 2017-04-04 DIAGNOSIS — M79672 Pain in left foot: Secondary | ICD-10-CM | POA: Insufficient documentation

## 2017-04-04 DIAGNOSIS — M79671 Pain in right foot: Secondary | ICD-10-CM | POA: Insufficient documentation

## 2017-04-04 DIAGNOSIS — F17210 Nicotine dependence, cigarettes, uncomplicated: Secondary | ICD-10-CM | POA: Insufficient documentation

## 2017-04-04 MED ORDER — CEPHALEXIN 500 MG PO CAPS: 500.0000 mg | ORAL_CAPSULE | Freq: Four times a day (QID) | ORAL | 0 refills | Status: AC

## 2017-04-04 MED ORDER — MELOXICAM 15 MG PO TABS: 15.0000 mg | ORAL_TABLET | Freq: Every day | ORAL | 0 refills | Status: DC

## 2017-04-04 MED ORDER — MELOXICAM 15 MG PO TABS: 15.0000 mg | ORAL_TABLET | Freq: Every day | ORAL | 0 refills | Status: AC

## 2017-04-04 MED ORDER — MELOXICAM 7.5 MG PO TABS
15.0000 mg | ORAL_TABLET | Freq: Once | ORAL | Status: AC
  Administered 2017-04-04: 15 mg via ORAL
  Filled 2017-04-04: qty 2

## 2017-04-04 MED ORDER — HYDROCODONE-ACETAMINOPHEN 5-325 MG PO TABS: 2.0000 | ORAL_TABLET | ORAL | 0 refills | Status: DC | PRN

## 2017-04-04 MED ORDER — MELOXICAM 15 MG PO TABS: 15.0000 mg | ORAL_TABLET | Freq: Every day | ORAL | Status: DC

## 2017-04-04 MED ORDER — HYDROCODONE-ACETAMINOPHEN 5-325 MG PO TABS
2.0000 | ORAL_TABLET | Freq: Once | ORAL | Status: AC
  Administered 2017-04-04: 2 via ORAL
  Filled 2017-04-04: qty 2

## 2017-04-04 MED ORDER — IBUPROFEN 800 MG PO TABS
800.0000 mg | ORAL_TABLET | Freq: Once | ORAL | Status: AC
  Administered 2017-04-04: 800 mg via ORAL
  Filled 2017-04-04: qty 1

## 2017-04-04 NOTE — ED Triage Notes (Signed)
PT reported to NT he slept in his car last night because he could not walk . Pt was seen at [facility] on Friday. Pt has a post -op shoe and walker.
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
## 2017-04-04 NOTE — ED Triage Notes (Signed)
DC papers reviewed with Pt . prescriptions  Reviewed . Pt wanted to know why he did not have a prescription for antniflammatory med. Pt wanted a prescription for meloxicam.  SN went to request RX for meloxicam.

## 2017-04-04 NOTE — ED Triage Notes (Signed)
SN with Ortho tech went to PT room . Pt confirmed along with both parents  That he does not want crutches to take home. All family members and Pt confirmed  Crutches not needed because he has a walker at home and he now has Cam boot on.

## 2017-04-04 NOTE — Progress Notes (Signed)
Orthopedic Tech Progress Note Patient Details:  Jorge Allen 01/01/1981 [id number]  Ortho Devices Type of Ortho Device: CAM walker Ortho Device/Splint Interventions: Application   Saul FordyceJennifer C Liv Rallis 04/04/2017, 2:35 PM

## 2017-04-04 NOTE — ED Notes (Signed)
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
## 2017-04-04 NOTE — ED Triage Notes (Signed)
At time of DC pt requested to use the urinal because he did not have his walker to use to go to [facility].

## 2017-04-04 NOTE — Discharge Instructions (Signed)
Follow up as directed
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
## 2017-04-04 NOTE — ED Triage Notes (Addendum)
Ortho tech reported pt requesting a W/C because he could not walk. Ortho tech reported Pt refusing crutches .

## 2017-04-04 NOTE — ED Triage Notes (Signed)
PT. Is refusing  Post-op shoe  And ace wrap. Jorge BroachKaren S. NP at bed side to asses PT needs. Ptg wants a cam walker and crutches.

## 2017-04-04 NOTE — ED Notes (Signed)
refused  Ace wrap to left ankle, until he speaks with Evergreen ColonySofia PA

## 2017-04-04 NOTE — ED Triage Notes (Signed)
Ortho tech in Pt room to fit for cam walker as Pt requested. Ortho tech also has  Crutches  With weight limit of 300lbs . Pt weighs 283 lbs.

## 2017-04-06 ENCOUNTER — Encounter (HOSPITAL_BASED_OUTPATIENT_CLINIC_OR_DEPARTMENT_OTHER): Payer: Self-pay | Admitting: *Deleted

## 2017-04-06 ENCOUNTER — Emergency Department (HOSPITAL_BASED_OUTPATIENT_CLINIC_OR_DEPARTMENT_OTHER)
Admission: EM | Admit: 2017-04-06 | Discharge: 2017-04-06 | Disposition: A | Discharge status: Home / Self Care | Payer: Self-pay | Attending: Emergency Medicine | Admitting: Emergency Medicine

## 2017-04-06 ENCOUNTER — Emergency Department (HOSPITAL_BASED_OUTPATIENT_CLINIC_OR_DEPARTMENT_OTHER): Payer: Self-pay

## 2017-04-06 DIAGNOSIS — F17210 Nicotine dependence, cigarettes, uncomplicated: Secondary | ICD-10-CM | POA: Insufficient documentation

## 2017-04-06 DIAGNOSIS — Y939 Activity, unspecified: Secondary | ICD-10-CM | POA: Insufficient documentation

## 2017-04-06 DIAGNOSIS — S93602A Unspecified sprain of left foot, initial encounter: Secondary | ICD-10-CM | POA: Insufficient documentation

## 2017-04-06 DIAGNOSIS — Y999 Unspecified external cause status: Secondary | ICD-10-CM | POA: Insufficient documentation

## 2017-04-06 DIAGNOSIS — S20211A Contusion of right front wall of thorax, initial encounter: Secondary | ICD-10-CM | POA: Insufficient documentation

## 2017-04-06 DIAGNOSIS — Y92411 Interstate highway as the place of occurrence of the external cause: Secondary | ICD-10-CM | POA: Insufficient documentation

## 2017-04-06 MED ORDER — OXYCODONE-ACETAMINOPHEN 5-325 MG PO TABS: 2.0000 | ORAL_TABLET | ORAL | 0 refills | Status: DC | PRN

## 2017-04-06 MED ORDER — OXYCODONE-ACETAMINOPHEN 5-325 MG PO TABS
2.0000 | ORAL_TABLET | Freq: Once | ORAL | Status: AC
Start: 2017-04-06 — End: 2017-04-06
  Administered 2017-04-06: 2 via ORAL
  Filled 2017-04-06: qty 2

## 2017-04-06 MED ORDER — METHOCARBAMOL 500 MG PO TABS: 500.0000 mg | ORAL_TABLET | Freq: Two times a day (BID) | ORAL | 0 refills | Status: DC

## 2017-04-06 MED FILL — METHOCARBAMOL 500 MG TABLET: 500 | 10 days supply | Qty: 20 | Fill #0

## 2017-04-06 MED FILL — OXYCODONE/APAP 5/325 MG TAB: 5-325 | 1 days supply | Qty: 6 | Fill #0

## 2017-04-06 NOTE — ED Notes (Signed)
ED Provider at bedside Dr. James 

## 2017-04-06 NOTE — ED Notes (Signed)
Patient transported to X-ray 
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
## 2017-04-06 NOTE — ED Notes (Signed)
Directed to pharmacy to pick up Rx 

## 2018-03-30 ENCOUNTER — Encounter (HOSPITAL_BASED_OUTPATIENT_CLINIC_OR_DEPARTMENT_OTHER): Payer: Self-pay

## 2018-03-30 ENCOUNTER — Other Ambulatory Visit: Payer: Self-pay

## 2018-03-30 DIAGNOSIS — F17210 Nicotine dependence, cigarettes, uncomplicated: Secondary | ICD-10-CM | POA: Insufficient documentation

## 2018-03-30 DIAGNOSIS — M5432 Sciatica, left side: Secondary | ICD-10-CM | POA: Insufficient documentation

## 2018-03-30 NOTE — ED Triage Notes (Signed)
C/o left hip and lower back pain yesterday-denies injury-NAD-slow gait

## 2018-03-31 ENCOUNTER — Emergency Department (HOSPITAL_BASED_OUTPATIENT_CLINIC_OR_DEPARTMENT_OTHER)
Admission: EM | Admit: 2018-03-31 | Discharge: 2018-03-31 | Disposition: A | Discharge status: Home / Self Care | Payer: Self-pay | Attending: Emergency Medicine | Admitting: Emergency Medicine

## 2018-03-31 DIAGNOSIS — M5432 Sciatica, left side: Secondary | ICD-10-CM

## 2018-03-31 MED ORDER — KETOROLAC TROMETHAMINE 30 MG/ML IJ SOLN
30.0000 mg | Freq: Once | INTRAMUSCULAR | Status: AC
  Administered 2018-03-31: 30 mg via INTRAVENOUS
  Filled 2018-03-31: qty 1

## 2018-03-31 MED ORDER — METHOCARBAMOL 1000 MG/10ML IJ SOLN
1000.0000 mg | Freq: Once | INTRAVENOUS | Status: AC
  Administered 2018-03-31: 1000 mg via INTRAVENOUS
  Filled 2018-03-31: qty 10

## 2018-03-31 MED ORDER — CYCLOBENZAPRINE HCL 10 MG PO TABS: 10.0000 mg | ORAL_TABLET | Freq: Three times a day (TID) | ORAL | 0 refills | Status: DC | PRN

## 2018-03-31 MED ORDER — MORPHINE SULFATE (PF) 4 MG/ML IV SOLN
4.0000 mg | Freq: Once | INTRAVENOUS | Status: AC
  Administered 2018-03-31: 4 mg via INTRAVENOUS
  Filled 2018-03-31: qty 1

## 2018-03-31 MED ORDER — HYDROCODONE-ACETAMINOPHEN 5-325 MG PO TABS: 1.0000 | ORAL_TABLET | ORAL | 0 refills | Status: DC | PRN

## 2018-03-31 MED ORDER — METHOCARBAMOL 1000 MG/10ML IJ SOLN
INTRAMUSCULAR | Status: AC
  Administered 2018-03-31: 1000 mg
  Filled 2018-03-31: qty 10

## 2018-03-31 NOTE — ED Notes (Signed)
ED MD informed of 9/10 pain after meds

## 2018-03-31 NOTE — ED Notes (Signed)
Given juice and warm blanket

## 2018-03-31 NOTE — ED Notes (Signed)
ED Provider at bedside. 

## 2018-03-31 NOTE — ED Provider Notes (Signed)
[facility] EMERGENCY DEPARTMENT Provider Note   CSN: [id number] Arrival date & time: 03/30/18  2223     History   Chief Complaint Chief Complaint  Patient presents with  . Hip Pain    HPI Jorge Allen is a 37 y.o. male.  The history is provided by the patient.  He has a history of obstructive sleep apnea, and comes in complaining of pain in his left hip and lower back which started yesterday.  Pain is dull and he rates it a 10/10.  It is worse with any movement.  He denies any weakness or numbness or tingling.  He denies any bowel or bladder dysfunction.  He denies any recent injury, unusual activity, overuse.  He denies history of prior back problems.  He has not taken anything for his pain.  Past Medical History:  Diagnosis Date  . Sleep apnea     Patient Active Problem List   Diagnosis Date Noted  . NONSPEC ELEVATION OF LEVELS OF TRANSAMINASE/LDH 12/21/2007  . TOBACCO USE 12/17/2007  . GERD 12/17/2007    Past Surgical History:  Procedure Laterality Date  . FOOT SURGERY    . TONSILLECTOMY          Home Medications    Prior to Admission medications   Medication Sig Start Date End Date Taking? Authorizing Provider  doxycycline (VIBRAMYCIN) 100 MG capsule Take 1 capsule (100 mg total) by mouth 2 (two) times daily. One po bid x 7 days 03/24/17   Molpus, John, MD  HYDROcodone-acetaminophen (NORCO/VICODIN) 5-325 MG tablet Take 2 tablets by mouth every 4 (four) hours as needed. 04/04/17   Elson Areas, PA-C  methocarbamol (ROBAXIN) 500 MG tablet Take 1 tablet (500 mg total) by mouth 2 (two) times daily. 04/06/17   Rolland Porter, MD  oxyCODONE-acetaminophen (PERCOCET/ROXICET) 5-325 MG tablet Take 2 tablets by mouth every 4 (four) hours as needed. 04/06/17   Rolland Porter, MD    Family History No family history on file.  Social History Social History   Tobacco Use  . Smoking status: Current Some Day Smoker    Types: Cigarettes  . Smokeless tobacco:  Never Used  Substance Use Topics  . Alcohol use: Yes    Comment: occasional  . Drug use: No     Allergies   Patient has no known allergies.   Review of Systems Review of Systems  All other systems reviewed and are negative.    Physical Exam Updated Vital Signs BP 108/64 (BP Location: Right Arm)   Pulse 84   Temp 98.4 F (36.9 C) (Oral)   Resp 20   Ht 5\' 5"  (1.651 m)   Wt (!) 139.3 kg (307 lb)   SpO2 98%   BMI 51.09 kg/m   Physical Exam  Nursing note and vitals reviewed.  Obese 37 year old male, resting comfortably and in no acute distress. Vital signs are normal. Oxygen saturation is 98%, which is normal. Head is normocephalic and atraumatic. PERRLA, EOMI. Oropharynx is clear. Neck is nontender and supple without adenopathy or JVD. Back is has mild tenderness in the mid and lower lumbar area.  There is moderate bilateral paralumbar spasm.  Straight leg raise is positive on the left at 30 degrees, and crossed straight leg raise is positive on the right at 30 degrees.  There is no CVA tenderness. Lungs are clear without rales, wheezes, or rhonchi. Chest is nontender. Heart has regular rate and rhythm without murmur. Abdomen is soft, flat, nontender  without masses or hepatosplenomegaly and peristalsis is normoactive. Extremities have no cyanosis or edema, full range of motion is present. Skin is warm and dry without rash. Neurologic: Mental status is normal, cranial nerves are intact, there are no motor or sensory deficits.  ED Treatments / Results   Procedures Procedures  Medications Ordered in ED Medications  morphine 4 MG/ML injection 4 mg (has no administration in time range)  ketorolac (TORADOL) 30 MG/ML injection 30 mg (30 mg Intravenous Given 03/31/18 0133)  morphine 4 MG/ML injection 4 mg (4 mg Intravenous Given 03/31/18 0134)  methocarbamol (ROBAXIN) 1,000 mg in dextrose 5 % 50 mL IVPB (0 mg Intravenous Stopped 03/31/18 0211)  methocarbamol (ROBAXIN) 1000  MG/10ML injection (1,000 mg  Given 03/31/18 0135)     Initial Impression / Assessment and Plan / ED Course  I have reviewed the triage vital signs and the nursing notes.  Acute low back pain with left-sided sciatica.  No indication for imaging at this point.  He is given IV ketorolac, morphine, methocarbamol and will be reassessed.  Old records are reviewed, and he has no relevant past visits, no prior lumbar imaging.  He had moderate relief of pain with above-noted treatment.  He is given an additional dose of morphine, discharged with prescriptions for cyclobenzaprine and hydrocodone-acetaminophen.  Told to take over-the-counter naproxen.  Return precautions discussed.  Referred to sports medicine for follow-up.  Final Clinical Impressions(s) / ED Diagnoses   Final diagnoses:  Sciatica, left side    ED Discharge Orders        Ordered    HYDROcodone-acetaminophen (NORCO/VICODIN) 5-325 MG tablet  Every 4 hours PRN     03/31/18 0249    cyclobenzaprine (FLEXERIL) 10 MG tablet  3 times daily PRN     03/31/18 0249       Dione BoozeGlick, Alexandr Yaworski, MD 03/31/18 [phone]

## 2018-03-31 NOTE — Discharge Instructions (Addendum)
Take two naproxen (Aleve) tablets at a time, twice a day. Take acetamijnophen as needed for less severe pain.  Apply ice to your lower back three times a day.

## 2020-01-19 ENCOUNTER — Ambulatory Visit: Payer: Self-pay | Admitting: Podiatry

## 2020-02-01 ENCOUNTER — Other Ambulatory Visit: Payer: Self-pay

## 2020-02-02 ENCOUNTER — Ambulatory Visit: Payer: Self-pay | Admitting: Adult Health

## 2020-02-03 ENCOUNTER — Ambulatory Visit (INDEPENDENT_AMBULATORY_CARE_PROVIDER_SITE_OTHER): Payer: Self-pay

## 2020-02-03 ENCOUNTER — Encounter: Payer: Self-pay | Admitting: Podiatry

## 2020-02-03 ENCOUNTER — Ambulatory Visit (INDEPENDENT_AMBULATORY_CARE_PROVIDER_SITE_OTHER): Payer: Self-pay | Admitting: Podiatry

## 2020-02-03 ENCOUNTER — Other Ambulatory Visit: Payer: Self-pay

## 2020-02-03 DIAGNOSIS — L84 Corns and callosities: Secondary | ICD-10-CM

## 2020-02-03 DIAGNOSIS — M779 Enthesopathy, unspecified: Secondary | ICD-10-CM

## 2020-02-07 ENCOUNTER — Ambulatory Visit: Payer: Self-pay | Admitting: Adult Health

## 2020-02-07 DIAGNOSIS — Z0289 Encounter for other administrative examinations: Secondary | ICD-10-CM

## 2020-02-08 NOTE — Progress Notes (Signed)
Subjective:   Patient ID: Jorge Allen, male   DOB: 39 y.o.   MRN: [id number]   HPI Patient presents stating he gets severe thick tissue formation both feet and he has flatfeet that he knows he needs inserts for. Patient states that he has had some previous treatments but never aggressively and patient does not smoke likes to be active and is moderately obese   Review of Systems  All other systems reviewed and are negative.       Objective:  Physical Exam Vitals and nursing note reviewed.  Constitutional:      Appearance: He is well-developed.  Pulmonary:     Effort: Pulmonary effort is normal.  Musculoskeletal:        General: Normal range of motion.  Skin:    General: Skin is warm.  Neurological:     Mental Status: He is alert.     Neurovascular status intact muscle strength found to be within normal limits with equinus condition noted. Patient is found to have flatfoot deformity bilateral was found to have severe keratotic lesions bilateral plantar foot digit and around the nail site right. Patient does have family history of this type of deformity     Assessment:  Severe keratotic lesion formation bilateral moderate flatfoot deformity bilateral with obesity is complicating factor     Plan:  H&P x-rays reviewed conditions discussed at great length. At this point aggressive debridement accomplished to try to take all pressure off the lesion formation with understanding these will recur and were not sure of the pattern. I then went ahead and recommended a orthotic to try to reduce the plantar pressure on his feet and build his arch up and I am referring him to the ped orthotist for this to be done  X-rays indicate there is flatfoot deformity no indications of advanced arthritis stress fracture

## 2020-02-13 ENCOUNTER — Emergency Department (HOSPITAL_BASED_OUTPATIENT_CLINIC_OR_DEPARTMENT_OTHER)
Admission: EM | Admit: 2020-02-13 | Discharge: 2020-02-13 | Disposition: A | Discharge status: Home / Self Care | Payer: Self-pay | Attending: Emergency Medicine | Admitting: Emergency Medicine

## 2020-02-13 ENCOUNTER — Other Ambulatory Visit: Payer: Self-pay

## 2020-02-13 ENCOUNTER — Encounter (HOSPITAL_BASED_OUTPATIENT_CLINIC_OR_DEPARTMENT_OTHER): Payer: Self-pay | Admitting: Emergency Medicine

## 2020-02-13 DIAGNOSIS — G44019 Episodic cluster headache, not intractable: Secondary | ICD-10-CM | POA: Insufficient documentation

## 2020-02-13 DIAGNOSIS — F17210 Nicotine dependence, cigarettes, uncomplicated: Secondary | ICD-10-CM | POA: Insufficient documentation

## 2020-02-13 MED ORDER — KETOROLAC TROMETHAMINE 15 MG/ML IJ SOLN
15.0000 mg | Freq: Once | INTRAMUSCULAR | Status: AC
  Administered 2020-02-13: 15 mg via INTRAMUSCULAR
  Filled 2020-02-13: qty 1

## 2020-02-13 MED ORDER — DIPHENHYDRAMINE HCL 50 MG/ML IJ SOLN
25.0000 mg | Freq: Once | INTRAMUSCULAR | Status: AC
  Administered 2020-02-13: 25 mg via INTRAVENOUS
  Filled 2020-02-13: qty 1

## 2020-02-13 MED ORDER — PROCHLORPERAZINE EDISYLATE 10 MG/2ML IJ SOLN
10.0000 mg | Freq: Once | INTRAMUSCULAR | Status: AC
  Administered 2020-02-13: 10 mg via INTRAVENOUS
  Filled 2020-02-13: qty 2

## 2020-02-13 MED ORDER — PROCHLORPERAZINE MALEATE 10 MG PO TABS: 10.0000 mg | ORAL_TABLET | Freq: Two times a day (BID) | ORAL | 0 refills | Status: DC | PRN

## 2020-02-13 NOTE — ED Notes (Signed)
ED Provider at bedside. 

## 2020-02-13 NOTE — ED Provider Notes (Signed)
[facility] EMERGENCY DEPARTMENT Provider Note   CSN: [id number] Arrival date & time: 02/13/20  0041     History Chief Complaint  Patient presents with  . Headache    Jorge Allen is a 39 y.o. male.  HPI     This is a 39 year old male with a history of sleep apnea who presents with headache.  Patient reports 1 week history of waxing and waning headache.  He describes it as temporal mostly on the left side and behind his left eye.  He describes left eye tearing.  Currently his pain is 7 out of 10.  He has taken some over-the-counter medications including Excedrin with intermittent relief.  Denies nausea or vomiting.  Denies vision changes, weakness, numbness, tingling.  Denies worst headache of his life.  Does report family history of cluster headaches but no personal diagnosis.  Denies recent fever or upper respiratory symptoms.  Past Medical History:  Diagnosis Date  . Sleep apnea     Patient Active Problem List   Diagnosis Date Noted  . NONSPEC ELEVATION OF LEVELS OF TRANSAMINASE/LDH 12/21/2007  . TOBACCO USE 12/17/2007  . GERD 12/17/2007    Past Surgical History:  Procedure Laterality Date  . FOOT SURGERY    . TONSILLECTOMY         History reviewed. No pertinent family history.  Social History   Tobacco Use  . Smoking status: Current Some Day Smoker    Types: Cigarettes  . Smokeless tobacco: Never Used  Substance Use Topics  . Alcohol use: Yes    Comment: occasional  . Drug use: No    Home Medications Prior to Admission medications   Medication Sig Start Date End Date Taking? Authorizing Provider  cyclobenzaprine (FLEXERIL) 10 MG tablet Take 1 tablet (10 mg total) by mouth 3 (three) times daily as needed for muscle spasms. 04/[birth year]/51   Delora Fuel, MD  Naproxen (NAPROSYN PO) Take by mouth.    [provider]    Allergies    Patient has no known allergies.  Review of Systems   Review of Systems  Constitutional: Negative for  fever.  Respiratory: Negative for shortness of breath.   Cardiovascular: Negative for chest pain.  Gastrointestinal: Negative for abdominal pain, nausea and vomiting.  Genitourinary: Negative for dysuria.  Neurological: Positive for headaches. Negative for speech difficulty, weakness and numbness.  All other systems reviewed and are negative.   Physical Exam Updated Vital Signs BP (!) 109/58 (BP Location: Right Wrist)   Pulse 86   Temp 98.5 F (36.9 C) (Oral)   Resp 18   Ht 1.651 m (5\' 5" )   Wt 136.1 kg   SpO2 97%   BMI 49.92 kg/m   Physical Exam Vitals and nursing note reviewed.  Constitutional:      Appearance: He is well-developed. He is obese. He is not ill-appearing.  HENT:     Head: Normocephalic and atraumatic.     Mouth/Throat:     Mouth: Mucous membranes are moist.  Eyes:     Extraocular Movements: Extraocular movements intact.     Pupils: Pupils are equal, round, and reactive to light.     Comments: Slight tearing and conjunctival injection right eye  Cardiovascular:     Rate and Rhythm: Normal rate and regular rhythm.     Heart sounds: Normal heart sounds. No murmur.  Pulmonary:     Effort: Pulmonary effort is normal. No respiratory distress.     Breath sounds: Normal breath sounds.  No wheezing.  Abdominal:     General: Bowel sounds are normal.     Palpations: Abdomen is soft.     Tenderness: There is no abdominal tenderness. There is no rebound.  Musculoskeletal:     Cervical back: Normal range of motion and neck supple.  Lymphadenopathy:     Cervical: No cervical adenopathy.  Skin:    General: Skin is warm and dry.  Neurological:     Mental Status: He is alert and oriented to person, place, and time.     Comments: Cranial nerves II through XII intact, 5 out of 5 strength in all 4 extremities, no dysmetria to finger-nose-finger  Psychiatric:        Mood and Affect: Mood normal.     ED Results / Procedures / Treatments   Labs (all labs ordered  are listed, but only abnormal results are displayed) Labs Reviewed - No data to display  EKG None  Radiology No results found.  Procedures Procedures (including critical care time)  Medications Ordered in ED Medications  prochlorperazine (COMPAZINE) injection 10 mg (10 mg Intravenous Given 02/13/20 0456)  diphenhydrAMINE (BENADRYL) injection 25 mg (25 mg Intravenous Given 02/13/20 0456)    ED Course  I have reviewed the triage vital signs and the nursing notes.  Pertinent labs & imaging results that were available during my care of the patient were reviewed by me and considered in my medical decision making (see chart for details).    MDM Rules/Calculators/A&P                      Patient presents with headache.  He is overall nontoxic and neurologically intact.  History is most suggestive of likely cluster headache given location and family history with genetic predisposition.  Patient has no red flags.  Patient was given a migraine cocktail.  On recheck, he states he feels much better and is resting comfortably.  Doubt subarachnoid hemorrhage or meningitis.  Do not feel he needs imaging at this time.  Recommend neurology follow-up for definitive diagnosis and preventative intervention.  After history, exam, and medical workup I feel the patient has been appropriately medically screened and is safe for discharge home. Pertinent diagnoses were discussed with the patient. Patient was given return precautions.  Final Clinical Impression(s) / ED Diagnoses Final diagnoses:  Episodic cluster headache, not intractable    Rx / DC Orders ED Discharge Orders    None       Shalamar Crays, Mayer Masker, MD 02/13/20 [phone]

## 2020-02-13 NOTE — ED Notes (Signed)
Pt is now awake, stating pain is worse than before. Will notify EDP.

## 2020-02-13 NOTE — ED Notes (Signed)
Pt calling out and stating he wants to go home. Informed patient that EDP has ordered more medications for his worsening headache. Pt willing to stay for medication. Placed on 2L oxygen via nasal cannula for pain per EDP.

## 2020-02-13 NOTE — ED Notes (Signed)
Pt very drowsy, difficult to rouse. Vital signs stable,will delay D/C and allow to sleep

## 2020-02-13 NOTE — Discharge Instructions (Addendum)
Seen today for headache.  The pattern of your headache is suspicious for cluster headaches.  Follow-up with neurology for definitive diagnosis.  You can use the compazine medication as needed for headache.

## 2020-02-13 NOTE — ED Triage Notes (Signed)
Headache to bilateral temples x1 week. Denies hx of migraines. Reports sensitivity to light and sound, denies N/V.

## 2020-02-15 ENCOUNTER — Other Ambulatory Visit: Payer: Self-pay

## 2020-02-15 ENCOUNTER — Encounter: Payer: Self-pay | Admitting: Neurology

## 2020-02-15 ENCOUNTER — Ambulatory Visit (INDEPENDENT_AMBULATORY_CARE_PROVIDER_SITE_OTHER): Payer: Self-pay | Admitting: Orthotics

## 2020-02-15 DIAGNOSIS — M7751 Other enthesopathy of right foot: Secondary | ICD-10-CM

## 2020-02-15 DIAGNOSIS — M779 Enthesopathy, unspecified: Secondary | ICD-10-CM

## 2020-02-15 DIAGNOSIS — L84 Corns and callosities: Secondary | ICD-10-CM

## 2020-02-15 DIAGNOSIS — M7752 Other enthesopathy of left foot: Secondary | ICD-10-CM

## 2020-02-15 NOTE — Progress Notes (Signed)
Patient came into today to be cast for Custom Foot Orthotics. Upon recommendation of Dr. 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

## 2020-02-16 ENCOUNTER — Ambulatory Visit: Payer: Self-pay | Admitting: Neurology

## 2020-02-16 ENCOUNTER — Telehealth: Payer: Self-pay | Admitting: Neurology

## 2020-02-16 ENCOUNTER — Encounter: Payer: Self-pay | Admitting: Neurology

## 2020-02-16 VITALS — BP 132/80 | HR 95 | Temp 99.2°F | Ht 65.0 in | Wt 300.3 lb

## 2020-02-16 DIAGNOSIS — F17200 Nicotine dependence, unspecified, uncomplicated: Secondary | ICD-10-CM

## 2020-02-16 DIAGNOSIS — R519 Headache, unspecified: Secondary | ICD-10-CM

## 2020-02-16 DIAGNOSIS — Z6841 Body Mass Index (BMI) 40.0 and over, adult: Secondary | ICD-10-CM

## 2020-02-16 DIAGNOSIS — F1010 Alcohol abuse, uncomplicated: Secondary | ICD-10-CM

## 2020-02-16 DIAGNOSIS — G4730 Sleep apnea, unspecified: Secondary | ICD-10-CM

## 2020-02-16 DIAGNOSIS — G44009 Cluster headache syndrome, unspecified, not intractable: Secondary | ICD-10-CM

## 2020-02-16 MED ORDER — SUMATRIPTAN SUCCINATE 50 MG PO TABS: 50.0000 mg | ORAL_TABLET | ORAL | 2 refills | Status: DC | PRN

## 2020-02-16 MED ORDER — VERAPAMIL HCL 40 MG PO TABS: ORAL_TABLET | ORAL | 3 refills | Status: DC

## 2020-02-16 NOTE — Patient Instructions (Signed)
You may have a headache syndrome called "cluster headaches". But I do believe, there are several other contributors to your headaches including sleep deprivation, untreated sleep apnea or intermittently treated sleep apnea with CPAP therapy, excessive alcohol consumption which can trigger cluster headaches, elevated blood pressure values, stress.   We will proceed with a brain MRI with and without contrast to rule out a structural cause of your headaches on the left side.  As discussed, please stay well-hydrated with water, avoid caffeine and limit yourself to 1-2 servings per day, try to quit smoking and reduce your alcohol consumption.  Please try to continue to work on weight loss.  Get back on your CPAP machine consistently, we will do a home sleep test to reevaluate your sleep apnea and I will be able to prescribe new supplies for you after your study, you may be able to use your previous machine.  For acute management of your headache you can use Imitrex (sumatriptan) 50 mg strength: take 1 pill early on when you suspect a migraine attack come on. You may take another pill within 2 hours, no more than 2 pills in 24 hours. Most people who take triptans do not have any serious side-effects. However, they can cause drowsiness (remember to not drive or use heavy machinery when drowsy), nausea, dizziness, dry mouth. Less common side effects include strange sensations, such as tightness in your chest or throat, tingling, flushing, and feelings of heaviness or pressure in areas such as the face, limbs, and chest. These in the chest can mimic heart related pain (angina) and may cause alarm, but usually these sensations are not harmful or a sign of a heart attack. However, if you develop intense chest pain or sensations of discomfort, you should stop taking your medication and consult with me or your PCP or go to the nearest urgent care facility or ER or call 911.   For prevention of headaches I suggested a  trial of verapamil 40 mg: take 1 pill in AM for one week, then twice daily thereafter. Side effects may include: constipation, lightheadedness, fainting, heartburn, wheezing, slow heartbeat, blurry vision, blue lips and fingernails, pins and needles sensations.   If you have a rash or difficulty breathing or chest pain, you have to stop the medication immediately. Call 911 if you think you are having a serious reaction.   Please schedule a formal eye examination with any eye doctor or optometrist of your choosing.  Follow-up in 3 months to see one of our nurse practitioners.

## 2020-02-16 NOTE — Telephone Encounter (Signed)
self pay order sent to GI. They will reach out to the patient to schedule.  °

## 2020-02-16 NOTE — Progress Notes (Signed)
Subjective:    Patient ID: Jorge Allen is a 39 y.o. male.  HPI     Huston Foley, MD, PhD [facility] [address] [address] [place], [place] [address]  I saw patient, Jorge Allen, as a referral from the ER for Evaluation of his recurrent headaches, concern for cluster headaches.  The patient is unaccompanied today.  He is a 39 year old right-handed gentleman with an underlying medical history of sleep apnea, and morbid obesity with a BMI of over 45, who presented to the emergency room on 02/13/2020 with a severe headache on the left side, including pain behind the left eye and excess lacrimation.  He was felt to have cluster headaches.  He had a nonfocal neurological exam and no imaging test was done at the time, he was treated symptomatically with Benadryl and Compazine and improved.  He reports that he was given a prescription for Compazine but it did not help and he does not have nausea.  He does not have a prior history of migraines or recurrent headaches.  His father has a history of cluster headaches.  He reports a left-sided headache and it hurts behind the eye and sometimes his nose gets drippy and congested and his left eye tears.  He wakes up in the middle of the night with a headache typically.  He has a history of severe obstructive sleep apnea and had surgery in 2002 after he was diagnosed with sleep apnea and had a tonsillectomy, turbinate reduction and uvulectomy at the time but ended up starting CPAP therapy in 2016.  He had seen his sleep specialist at [facility] for this but has not seen her in years and does not use his CPAP consistently.  He reports a erratic work schedule and significant work-related stress and does not always sleep with a set schedule and using CPAP makes him sleep deeply that he does not hear the alarm.  Sometimes he does not go to bed until 2 or 3 and his rise time is around 6 or 7.  He owns a Building services engineer.  He is  working on weight loss.  He smokes about a pack per day, or a little less.  He tries to hydrate well with water.  He drinks alcohol every day in the form of mixed drinks with bourbon, and a large yeti cup, 2 or 3/day on average.  He denies any sudden onset of one-sided weakness or numbness or tingling or droopy face or slurring of speech.  He has not seen an eye doctor and does not currently have a primary care physician.  He denies any photophobia or nausea or vomiting.  He currently has a dull achy headache.  Headache started about 10 days ago.  His Past Medical History Is Significant For: Past Medical History:  Diagnosis Date  . Sleep apnea     His Past Surgical History Is Significant For: Past Surgical History:  Procedure Laterality Date  . FOOT SURGERY    . TONSILLECTOMY      His Family History Is Significant For: History reviewed. No pertinent family history.  His Social History Is Significant For: Social History   Socioeconomic History  . Marital status: Single    Spouse name: Not on file  . Number of children: Not on file  . Years of education: Not on file  . Highest education level: Not on file  Occupational History  . Not on file  Tobacco Use  . Smoking status:  Current Some Day Smoker    Types: Cigarettes  . Smokeless tobacco: Never Used  Substance and Sexual Activity  . Alcohol use: Yes    Comment: occasional  . Drug use: No  . Sexual activity: Not on file  Other Topics Concern  . Not on file  Social History Narrative  . Not on file   Social Determinants of Health   Financial Resource Strain:   . Difficulty of Paying Living Expenses:   Food Insecurity:   . Worried About Charity fundraiser in the Last Year:   . Arboriculturist in the Last Year:   Transportation Needs:   . Film/video editor (Medical):   Marland Kitchen Lack of Transportation (Non-Medical):   Physical Activity:   . Days of Exercise per Week:   . Minutes of Exercise per Session:   Stress:   .  Feeling of Stress :   Social Connections:   . Frequency of Communication with Friends and Family:   . Frequency of Social Gatherings with Friends and Family:   . Attends Religious Services:   . Active Member of Clubs or Organizations:   . Attends Archivist Meetings:   Marland Kitchen Marital Status:     His Allergies Are:  No Known Allergies:   His Current Medications Are:  Outpatient Encounter Medications as of 02/16/2020  Medication Sig  . Naproxen (NAPROSYN PO) Take by mouth.  . cyclobenzaprine (FLEXERIL) 10 MG tablet Take 1 tablet (10 mg total) by mouth 3 (three) times daily as needed for muscle spasms. (Patient not taking: Reported on 02/16/2020)  . prochlorperazine (COMPAZINE) 10 MG tablet Take 1 tablet (10 mg total) by mouth 2 (two) times daily as needed for nausea. (Patient not taking: Reported on 02/16/2020)   No facility-administered encounter medications on file as of 02/16/2020.  :   Review of Systems:  Out of a complete 14 point review of systems, all are reviewed and negative with the exception of these symptoms as listed below:  Review of Systems  Neurological:       Here for consult on worsening h/a. Reports 1 week ago and severe h/a that has lingered. Pt reports he typically does not struggle with h/a. Pain is mostly on right side and has associated noise sensitivity.   Epworth Sleepiness Scale 0= would never doze 1= slight chance of dozing 2= moderate chance of dozing 3= high chance of dozing  Sitting and reading:30 Watching TV:2 Sitting inactive in a public place (ex. Theater or meeting):3 As a passenger in a car for an hour without a break:3 Lying down to rest in the afternoon:3 Sitting and talking to someone:1 Sitting quietly after lunch (no alcohol):3 In a car, while stopped in traffic:2 Total:20     Objective:  Neurological Exam  Physical Exam Physical Examination:   Vitals:   02/16/20 1511  BP: 132/80  Pulse: 95  Temp: 99.2 F (37.3 C)   SpO2: 96%    General Examination: The patient is a very pleasant 39 y.o. male in no acute distress. He appears well-developed and well-nourished and well groomed.   HEENT: Normocephalic, atraumatic, pupils are equal, round and reactive to light and accommodation. Funduscopic exam is normal with sharp disc margins noted. No photophobia, or conjunctival injection or lacrimation.  Extraocular tracking is good without limitation to gaze excursion or nystagmus noted. Normal smooth pursuit is noted. Hearing is grossly intact. Face is symmetric with normal facial animation and normal facial sensation. Speech is clear  with no dysarthria noted. There is no hypophonia. There is no lip, neck/head, jaw or voice tremor. Neck is supple with full range of passive and active motion. There are no carotid bruits on auscultation. Oropharynx exam reveals: mild mouth dryness, adequate dental hygiene and moderate airway crowding, with small airway entry noted, status post airway surgery with uvulectomy, tonsils absent, tongue protrudes centrally in palate elevates symmetrically.  Chest: Clear to auscultation without wheezing, rhonchi or crackles noted.  Heart: S1+S2+0, regular and normal without murmurs, rubs or gallops noted.   Abdomen: Soft, non-tender and non-distended with normal bowel sounds appreciated on auscultation.  Extremities: There is pain in the R ankle.  Skin: Warm and dry without trophic changes noted. There are no varicose veins.  Musculoskeletal: exam reveals no obvious joint deformities, tenderness or joint swelling or erythema.   Neurologically:  Mental status: The patient is awake, alert and oriented in all 4 spheres. His immediate and remote memory, attention, language skills and fund of knowledge are appropriate. There is no evidence of aphasia, agnosia, apraxia or anomia. Speech is clear with normal prosody and enunciation. Thought process is linear. Mood is normal and affect is normal.   Cranial nerves II - XII are as described above under HEENT exam. In addition: shoulder shrug is normal with equal shoulder height noted. Motor exam: Normal bulk, strength and tone is noted. There is no drift, tremor or rebound. Romberg is negative. Reflexes are 2+ throughout. Babinski: Toes are flexor bilaterally. Fine motor skills and coordination: intact with normal finger taps, normal hand movements, normal rapid alternating patting, normal foot taps and normal foot agility.  Cerebellar testing: No dysmetria or intention tremor on finger to nose testing. Heel to shin is unremarkable bilaterally. There is no truncal or gait ataxia.  Sensory exam: intact to light touch in the upper and lower extremities.  Gait, station and balance: He stands easily. No veering to one side is noted. No leaning to one side is noted. Posture is age-appropriate and stance is narrow based. Gait shows normal stride length and normal pace. No problems turning are noted. Tandem walk is unremarkable.   Assessment and Plan:   In summary, Cameren L Sian is a very pleasant 39 y.o.-year old male with an underlying medical history of sleep apnea, and morbid obesity with a BMI of over 45, who presents for evaluation of his recurrent headache on the left side, started about 10 days ago.  His history and examination is suggestive of a mixed headache syndrome, well cluster headache as a good possibility, he may have worsening headaches secondary to untreated obstructive sleep apnea, headaches exacerbated by excessive alcohol consumption on a daily basis which can trigger cluster headaches also, sleep deprivation, elevated blood pressure values.  He is advised of these possibilities, he is furthermore advised to be consistent with his CPAP machine.  He needs new supplies and has not had evaluation since approximately 2016.  Prior sleep study results are not available for my review today or a CPAP compliance download.  He is agreeable to  pursuing a home sleep test for reevaluation.  We talked about the importance of healthy lifestyle, he is advised to stop smoking, stay well-hydrated with water and try to achieve enough sleep, 7 to 8 hours.  He is furthermore advised to reduce his daily alcohol consumption.  He is advised to seek a formal eye examination with an optometrist of his choosing.  We will proceed with a brain MRI with and without contrast  to rule out a structural cause of his left-sided headaches.  Neurological exam is nonfocal thankfully.  He is encouraged to try Imitrex as needed for acute management of headache flareup.  I suggested verapamil low-dose with gradual titration for prevention on a day-to-day basis which can help migraine headaches as well as cluster headaches.  He is advised to establish care with a primary care physician.  We we will call him to schedule his MRI and his home sleep test.  I suggested follow-up routinely to see one of our nurse practitioners in 3 months, sooner if needed.  He was given a prescription for Imitrex and verapamil and detailed written instructions.  I answered all his questions today and he was in agreement.   Huston Foley, MD, PhD

## 2020-02-17 ENCOUNTER — Ambulatory Visit
Admission: RE | Admit: 2020-02-17 | Discharge: 2020-02-17 | Disposition: A | Discharge status: Home / Self Care | Payer: Self-pay | Source: Ambulatory Visit | Attending: Neurology | Admitting: Neurology

## 2020-02-17 DIAGNOSIS — F1010 Alcohol abuse, uncomplicated: Secondary | ICD-10-CM

## 2020-02-17 DIAGNOSIS — G4730 Sleep apnea, unspecified: Secondary | ICD-10-CM

## 2020-02-17 DIAGNOSIS — R519 Headache, unspecified: Secondary | ICD-10-CM

## 2020-02-17 DIAGNOSIS — G44009 Cluster headache syndrome, unspecified, not intractable: Secondary | ICD-10-CM

## 2020-02-17 DIAGNOSIS — F17200 Nicotine dependence, unspecified, uncomplicated: Secondary | ICD-10-CM

## 2020-02-17 LAB — COMPREHENSIVE METABOLIC PANEL
ALT: 5 IU/L (ref 0–44)
AST: 15 IU/L (ref 0–40)
Albumin/Globulin Ratio: 1.5 (ref 1.2–2.2)
Albumin: 4 g/dL (ref 4.0–5.0)
Alkaline Phosphatase: 86 IU/L (ref 39–117)
BUN/Creatinine Ratio: 14 (ref 9–20)
BUN: 12 mg/dL (ref 6–20)
Bilirubin Total: 0.4 mg/dL (ref 0.0–1.2)
CO2: 25 mmol/L (ref 20–29)
Calcium: 9.5 mg/dL (ref 8.7–10.2)
Chloride: 102 mmol/L (ref 96–106)
Creatinine, Ser: 0.83 mg/dL (ref 0.76–1.27)
GFR calc Af Amer: 128 mL/min/{1.73_m2} (ref >59)
GFR calc non Af Amer: 111 mL/min/{1.73_m2} (ref >59)
Globulin, Total: 2.6 g/dL (ref 1.5–4.5)
Glucose: 104 mg/dL — ABNORMAL HIGH (ref 65–99)
Potassium: 4.3 mmol/L (ref 3.5–5.2)
Sodium: 141 mmol/L (ref 134–144)
Total Protein: 6.6 g/dL (ref 6.0–8.5)

## 2020-02-17 MED ORDER — GADOBENATE DIMEGLUMINE 529 MG/ML IV SOLN
20.0000 mL | Freq: Once | INTRAVENOUS | Status: AC | PRN
  Administered 2020-02-17: 20 mL via INTRAVENOUS

## 2020-02-20 ENCOUNTER — Telehealth: Payer: Self-pay

## 2020-02-20 NOTE — Progress Notes (Signed)
Brain MRI w/wo contrast showed normal findings; please update pt.

## 2020-02-20 NOTE — Telephone Encounter (Signed)
I reached out to the pt and we were able to discuss the MRI findings. Pt verbalized understanding.   Pt did ask when his HST would be scheduled. Pt is self pay. I advised I would send a message to the sleep lab to review.  Pt was agreeable.

## 2020-02-20 NOTE — Telephone Encounter (Signed)
-----   Message from Huston Foley, MD sent at 02/20/2020  7:08 AM EDT ----- Brain MRI w/wo contrast showed normal findings; please update pt.

## 2020-03-08 ENCOUNTER — Other Ambulatory Visit: Payer: Self-pay

## 2020-03-08 ENCOUNTER — Ambulatory Visit: Payer: Self-pay | Admitting: Orthotics

## 2020-03-08 DIAGNOSIS — M779 Enthesopathy, unspecified: Secondary | ICD-10-CM

## 2020-03-08 DIAGNOSIS — L84 Corns and callosities: Secondary | ICD-10-CM

## 2020-03-08 NOTE — Progress Notes (Signed)
Patient came in today to pick up custom made foot orthotics.  The goals were accomplished and the patient reported no dissatisfaction with said orthotics.  Patient was advised of breakin period and how to report any issues. 

## 2020-05-03 ENCOUNTER — Ambulatory Visit: Payer: Self-pay | Admitting: Neurology

## 2020-05-21 ENCOUNTER — Other Ambulatory Visit: Payer: Self-pay

## 2020-05-21 ENCOUNTER — Emergency Department
Admission: EM | Admit: 2020-05-21 | Discharge: 2020-05-21 | Disposition: A | Discharge status: Home / Self Care | Payer: Self-pay | Attending: Emergency Medicine | Admitting: Emergency Medicine

## 2020-05-21 ENCOUNTER — Encounter: Payer: Self-pay | Admitting: *Deleted

## 2020-05-21 DIAGNOSIS — F17210 Nicotine dependence, cigarettes, uncomplicated: Secondary | ICD-10-CM | POA: Insufficient documentation

## 2020-05-21 DIAGNOSIS — K649 Unspecified hemorrhoids: Secondary | ICD-10-CM | POA: Insufficient documentation

## 2020-05-21 DIAGNOSIS — Z79899 Other long term (current) drug therapy: Secondary | ICD-10-CM | POA: Insufficient documentation

## 2020-05-21 MED ORDER — HYDROCORTISONE ACETATE 25 MG RE SUPP: 25.0000 mg | Freq: Two times a day (BID) | RECTAL | 0 refills | Status: DC

## 2020-05-21 MED ORDER — LIDOCAINE-PRILOCAINE 2.5-2.5 % EX CREA: 1.0000 "application " | TOPICAL_CREAM | CUTANEOUS | 0 refills | Status: DC | PRN

## 2020-05-21 MED ORDER — PRAMOXINE HCL (PERIANAL) 1 % EX FOAM: 1.0000 "application " | Freq: Three times a day (TID) | CUTANEOUS | 0 refills | Status: DC | PRN

## 2020-05-21 NOTE — ED Notes (Signed)
See triage note  Presents with hemorrhoids  States  Has had hx of same  But this one is larger and more painful

## 2020-05-21 NOTE — ED Provider Notes (Signed)
[facility] Emergency Department Provider Note  ____________________________________________  Time seen: Approximately 4:55 PM  I have reviewed the triage vital signs and the nursing notes.   HISTORY  Chief Complaint Hemorrhoids    HPI Jorge Allen is a 39 y.o. male that presents to the emergency department with concerns of a hemorrhoid for 2 to 3 days.  Area is painful.  Patient has a history of hemorrhoids.  He denies any recent constipation or straining.  No fevers.  No drainage.  He has been using OTC medications.  Past Medical History:  Diagnosis Date  . Sleep apnea     Patient Active Problem List   Diagnosis Date Noted  . NONSPEC ELEVATION OF LEVELS OF TRANSAMINASE/LDH 12/21/2007  . TOBACCO USE 12/17/2007  . GERD 12/17/2007    Past Surgical History:  Procedure Laterality Date  . FOOT SURGERY    . TONSILLECTOMY      Prior to Admission medications   Medication Sig Start Date End Date Taking? Authorizing Provider  hydrocortisone (ANUSOL-HC) 25 MG suppository Place 1 suppository (25 mg total) rectally 2 (two) times daily. 05/21/20   Enid Derry, PA-C  lidocaine-prilocaine (EMLA) cream Apply 1 application topically as needed. 05/21/20   Enid Derry, PA-C  Naproxen (NAPROSYN PO) Take by mouth.    [provider]  pramoxine (PROCTOFOAM) 1 % foam Place 1 application rectally 3 (three) times daily as needed for anal itching. 05/21/20   Enid Derry, PA-C  SUMAtriptan (IMITREX) 50 MG tablet Take 1 tablet (50 mg total) by mouth every 2 (two) hours as needed for migraine (no more than 2 pills in 24/h). 02/16/20   Huston Foley, MD  verapamil (CALAN) 40 MG tablet 1 pill in AM for one week, then twice daily thereafter. 02/16/20   Huston Foley, MD    Allergies Patient has no known allergies.  No family history on file.  Social History Social History   Tobacco Use  . Smoking status: Current Some Day Smoker    Types: Cigarettes  .  Smokeless tobacco: Never Used  Vaping Use  . Vaping Use: Never used  Substance Use Topics  . Alcohol use: Yes    Comment: occasional  . Drug use: No     Review of Systems  Constitutional: No fever/chills Respiratory: No SOB. Gastrointestinal: No abdominal pain.  No nausea, no vomiting.  Musculoskeletal: Negative for musculoskeletal pain. Skin: Negative for rash, abrasions, lacerations, ecchymosis. Neurological: Negative for headaches   ____________________________________________   PHYSICAL EXAM:  VITAL SIGNS: ED Triage Vitals  Enc Vitals Group     BP 05/21/20 1543 (!) 146/79     Pulse Rate 05/21/20 1539 86     Resp 05/21/20 1539 20     Temp 05/21/20 1539 99.2 F (37.3 C)     Temp Source 05/21/20 1539 Oral     SpO2 05/21/20 1539 96 %     Weight 05/21/20 1541 300 lb (136.1 kg)     Height 05/21/20 1541 5\' 5"  (1.651 m)     Head Circumference --      Peak Flow --      Pain Score 05/21/20 1540 10     Pain Loc --      Pain Edu? --      Excl. in GC? --      Constitutional: Alert and oriented. Well appearing and in no acute distress. Eyes: Conjunctivae are normal. PERRL. EOMI. Head: Atraumatic. ENT:      Ears:  Nose: No congestion/rhinnorhea.      Mouth/Throat: Mucous membranes are moist.  Neck: No stridor.   Cardiovascular: Normal rate, regular rhythm.  Good peripheral circulation. Respiratory: Normal respiratory effort without tachypnea or retractions. Lungs CTAB. Good air entry to the bases with no decreased or absent breath sounds. Gastrointestinal: Bowel sounds 4 quadrants. Soft and nontender to palpation. No guarding or rigidity. No palpable masses. No distention.  Rectal: 1/2 cm x 1 cm area of swelling with overlying purpleish skin to 3 PM position of rectum with tenderness to palpation. Musculoskeletal: Full range of motion to all extremities. No gross deformities appreciated. Neurologic:  Normal speech and language. No gross focal neurologic deficits  are appreciated.  Skin:  Skin is warm, dry and intact. No rash noted. Psychiatric: Mood and affect are normal. Speech and behavior are normal. Patient exhibits appropriate insight and judgement.   ____________________________________________   LABS (all labs ordered are listed, but only abnormal results are displayed)  Labs Reviewed - No data to display ____________________________________________  EKG   ____________________________________________  RADIOLOGY  No results found.  ____________________________________________    PROCEDURES  Procedure(s) performed:    Procedures    Medications - No data to display   ____________________________________________   INITIAL IMPRESSION / ASSESSMENT AND PLAN / ED COURSE  Pertinent labs & imaging results that were available during my care of the patient were reviewed by me and considered in my medical decision making (see chart for details).  Review of the [place] CSRS was performed in accordance of the [facility] prior to dispensing any controlled drugs.   Patient's diagnosis is consistent with thrombosed hemorrhoid.  Vital signs and exam are reassuring.  Patient has a visible thrombosed hemorrhoid on exam.  This has been present for 2 to 3 days.  Exam is not consistent with an abscess.  We discussed incision and drainage versus conservative treatment.  Patient is afraid of needles and would like to follow up with GI for excision of hemorrhoid. He elects conservative treatment with follow-up with GI for definitive management.  Patient will be discharged home with prescriptions for Proctofoam, Anusol, lidocaine. Patient is to follow up with GI as directed. Patient is given ED precautions to return to the ED for any worsening or new symptoms.   Jorge Allen was evaluated in Emergency Department on 05/21/2020 for the symptoms described in the history of present illness. He was evaluated in the context of the global COVID-19 pandemic,  which necessitated consideration that the patient might be at risk for infection with the SARS-CoV-2 virus that causes COVID-19. Institutional protocols and algorithms that pertain to the evaluation of patients at risk for COVID-19 are in a state of rapid change based on information released by regulatory bodies including the CDC and federal and state organizations. These policies and algorithms were followed during the patient's care in the ED.  ____________________________________________  FINAL CLINICAL IMPRESSION(S) / ED DIAGNOSES  Final diagnoses:  Hemorrhoids, unspecified hemorrhoid type      NEW MEDICATIONS STARTED DURING THIS VISIT:  ED Discharge Orders         Ordered    pramoxine (PROCTOFOAM) 1 % foam  3 times daily PRN     Discontinue  Reprint     05/21/20 1704    hydrocortisone (ANUSOL-HC) 25 MG suppository  2 times daily     Discontinue  Reprint     05/21/20 1704    lidocaine-prilocaine (EMLA) cream  As needed     Discontinue  Reprint  05/21/20 1704              This chart was dictated using voice recognition software/Dragon. Despite best efforts to proofread, errors can occur which can change the meaning. Any change was purely unintentional.    Enid Derry, PA-C 05/21/20 2315    Jene Every, MD 05/22/20 1530

## 2020-05-21 NOTE — ED Triage Notes (Signed)
Pt reports he has a hemorrhoid for 2 days.  Pt has rectal pain.  No bleeding.  Pt using otc meds without relief  Pt alert  Speech clear.

## 2020-05-21 NOTE — Discharge Instructions (Addendum)
Please call GI for an appointment tomorrow. You will likely need a procedure to excise your hemorrhoid. Please begin prescribed medications to help with your symptoms. Begin sitz baths.

## 2020-05-22 ENCOUNTER — Telehealth: Payer: Self-pay

## 2020-05-22 NOTE — Telephone Encounter (Signed)
Patient is calling because he was seen in the ER on 05/21/2020 for Hemorrhoids. The ER referred him to Dr. Maximino Greenland. Patient called this morning because he wants to be seen today. Informed patient that we could not see him today that we did not have any appointment slots. Informed patient we had 2 providers off on vacation and only one provider seeing patient this afternoon. Offered patient Dr. Maximino Greenland first available appointment which was 06/28/2020 at 1:15pm. Patient declined and states he can not wait that long. Explained to patient I could make appointment and we put him on wait list. Patient declined that also. Patient asked if he could take the hospital referral to another office. Explained to patient that he could not since the hospital referred him to [place]. Informed patient he could contact his PCP and they could refer him to another office. Patient stated he did not have a PCP. Asked patient if he wanted me to make the 06/28/2020 appointment. Patient said no and disconnected the phone/

## 2020-11-22 ENCOUNTER — Encounter (HOSPITAL_COMMUNITY): Payer: Self-pay

## 2020-11-22 ENCOUNTER — Other Ambulatory Visit: Payer: Self-pay

## 2020-11-22 ENCOUNTER — Ambulatory Visit (INDEPENDENT_AMBULATORY_CARE_PROVIDER_SITE_OTHER): Payer: 59

## 2020-11-22 ENCOUNTER — Ambulatory Visit (HOSPITAL_COMMUNITY)
Admission: EM | Admit: 2020-11-22 | Discharge: 2020-11-22 | Disposition: A | Discharge status: Home / Self Care | Payer: 59 | Attending: Emergency Medicine | Admitting: Emergency Medicine

## 2020-11-22 DIAGNOSIS — R0602 Shortness of breath: Secondary | ICD-10-CM

## 2020-11-22 DIAGNOSIS — F17210 Nicotine dependence, cigarettes, uncomplicated: Secondary | ICD-10-CM | POA: Insufficient documentation

## 2020-11-22 DIAGNOSIS — R059 Cough, unspecified: Secondary | ICD-10-CM | POA: Insufficient documentation

## 2020-11-22 DIAGNOSIS — Z20822 Contact with and (suspected) exposure to covid-19: Secondary | ICD-10-CM | POA: Diagnosis not present

## 2020-11-22 MED ORDER — BENZONATATE 100 MG PO CAPS: 100.0000 mg | ORAL_CAPSULE | Freq: Three times a day (TID) | ORAL | 0 refills | Status: DC

## 2020-11-22 NOTE — Discharge Instructions (Signed)
Your COVID test is pending.  You should self quarantine until the test result is back.    Take Tylenol or ibuprofen as needed for fever or discomfort.  Take the Tessalon Perles as needed for cough.  Rest and keep yourself hydrated.    Follow-up with your primary care provider if your symptoms are not improving.       

## 2020-11-22 NOTE — ED Provider Notes (Signed)
[facility]    CSN: [id number] Arrival date & time: 11/22/20  1519      History   Chief Complaint Chief Complaint  Patient presents with  . coughed up blood  . Shortness of Breath    HPI Jorge Allen is a 40 y.o. male.   Patient presents with cough and shortness of breath since this morning.  He states he has had blood-tinged sputum three times this morning.  He denies fever, chills, chest pain, abdominal pain, vomiting, diarrhea, or other symptoms.  No treatments attempted at home.  He reports history of pneumonia.  His medical history also includes current smoker, GERD, sleep apnea.  The history is provided by the patient and medical records.    Past Medical History:  Diagnosis Date  . Sleep apnea     Patient Active Problem List   Diagnosis Date Noted  . NONSPEC ELEVATION OF LEVELS OF TRANSAMINASE/LDH 12/21/2007  . TOBACCO USE 12/17/2007  . GERD 12/17/2007    Past Surgical History:  Procedure Laterality Date  . FOOT SURGERY    . TONSILLECTOMY         Home Medications    Prior to Admission medications   Medication Sig Start Date End Date Taking? Authorizing Provider  benzonatate (TESSALON) 100 MG capsule Take 1 capsule (100 mg total) by mouth every 8 (eight) hours. 11/22/20  Yes Mickie Bail, NP  hydrocortisone (ANUSOL-HC) 25 MG suppository Place 1 suppository (25 mg total) rectally 2 (two) times daily. 05/21/20   Enid Derry, PA-C  lidocaine-prilocaine (EMLA) cream Apply 1 application topically as needed. 05/21/20   Enid Derry, PA-C  Naproxen (NAPROSYN PO) Take by mouth.    [provider]  pramoxine (PROCTOFOAM) 1 % foam Place 1 application rectally 3 (three) times daily as needed for anal itching. 05/21/20   Enid Derry, PA-C  SUMAtriptan (IMITREX) 50 MG tablet Take 1 tablet (50 mg total) by mouth every 2 (two) hours as needed for migraine (no more than 2 pills in 24/h). 02/16/20   Huston Foley, MD  verapamil (CALAN) 40 MG tablet 1  pill in AM for one week, then twice daily thereafter. 02/16/20   Huston Foley, MD    Family History History reviewed. No pertinent family history.  Social History Social History   Tobacco Use  . Smoking status: Current Some Day Smoker    Types: Cigarettes  . Smokeless tobacco: Never Used  Vaping Use  . Vaping Use: Never used  Substance Use Topics  . Alcohol use: Yes    Comment: occasional  . Drug use: No     Allergies   Patient has no known allergies.   Review of Systems Review of Systems  Constitutional: Negative for chills and fever.  HENT: Negative for ear pain and sore throat.   Eyes: Negative for pain and visual disturbance.  Respiratory: Positive for cough and shortness of breath.   Cardiovascular: Negative for chest pain and palpitations.  Gastrointestinal: Negative for abdominal pain, diarrhea and vomiting.  Genitourinary: Negative for dysuria and hematuria.  Musculoskeletal: Negative for arthralgias and back pain.  Skin: Negative for color change and rash.  Neurological: Negative for seizures and syncope.  All other systems reviewed and are negative.    Physical Exam Triage Vital Signs ED Triage Vitals  Enc Vitals Group     BP      Pulse      Resp      Temp      Temp src  SpO2      Weight      Height      Head Circumference      Peak Flow      Pain Score      Pain Loc      Pain Edu?      Excl. in GC?    No data found.  Updated Vital Signs BP 138/70   Pulse 89   Temp 98.7 F (37.1 C)   Resp 20   SpO2 98%   Visual Acuity Right Eye Distance:   Left Eye Distance:   Bilateral Distance:    Right Eye Near:   Left Eye Near:    Bilateral Near:     Physical Exam Vitals and nursing note reviewed.  Constitutional:      General: He is not in acute distress.    Appearance: He is well-developed and well-nourished. He is obese. He is not ill-appearing.  HENT:     Head: Normocephalic and atraumatic.     Mouth/Throat:     Mouth: Mucous  membranes are moist.  Eyes:     Conjunctiva/sclera: Conjunctivae normal.  Cardiovascular:     Rate and Rhythm: Normal rate and regular rhythm.     Heart sounds: Normal heart sounds.  Pulmonary:     Effort: Pulmonary effort is normal. No respiratory distress.     Breath sounds: Normal breath sounds.  Abdominal:     Palpations: Abdomen is soft.     Tenderness: There is no abdominal tenderness.  Musculoskeletal:        General: No edema.     Cervical back: Neck supple.  Skin:    General: Skin is warm and dry.  Neurological:     General: No focal deficit present.     Mental Status: He is alert and oriented to person, place, and time.     Gait: Gait normal.  Psychiatric:        Mood and Affect: Mood and affect and mood normal.        Behavior: Behavior normal.      UC Treatments / Results  Labs (all labs ordered are listed, but only abnormal results are displayed) Labs Reviewed  SARS CORONAVIRUS 2 (TAT 6-24 HRS)    EKG   Radiology DG Chest 2 View  Result Date: 11/22/2020 CLINICAL DATA:  40 year old male with shortness of breath. EXAM: CHEST - 2 VIEW COMPARISON:  Chest radiograph dated 04/06/2017. FINDINGS: No focal consolidation, pleural effusion, or pneumothorax. Stable cardiac silhouette. No acute osseous pathology. IMPRESSION: No active cardiopulmonary disease. Electronically Signed   By: Elgie Collard M.D.   On: 11/22/2020 17:17    Procedures Procedures (including critical care time)  Medications Ordered in UC Medications - No data to display  Initial Impression / Assessment and Plan / UC Course  I have reviewed the triage vital signs and the nursing notes.  Pertinent labs & imaging results that were available during my care of the patient were reviewed by me and considered in my medical decision making (see chart for details).   Cough, SOB.  Chest x-ray negative.  O2 sat 98%.  COVID pending.  Instructed patient to self quarantine until the test results are  back.  Tessalon Perles as needed for cough.  Discussed symptomatic treatment including Tylenol, rest, hydration.  Instructed patient to follow up with PCP if his symptoms are not improving.  Patient agrees to plan of care.    Final Clinical Impressions(s) / UC Diagnoses  Final diagnoses:  Cough  SOB (shortness of breath)     Discharge Instructions     Your COVID test is pending.  You should self quarantine until the test result is back.    Take Tylenol or ibuprofen as needed for fever or discomfort.  Take the [facility] as needed for cough.  Rest and keep yourself hydrated.    Follow-up with your primary care provider if your symptoms are not improving.        ED Prescriptions    Medication Sig Dispense Auth. Provider   benzonatate (TESSALON) 100 MG capsule Take 1 capsule (100 mg total) by mouth every 8 (eight) hours. 21 capsule Mickie Bail, NP     PDMP not reviewed this encounter.   Mickie Bail, NP 11/22/20 [phone]

## 2020-11-22 NOTE — ED Triage Notes (Signed)
Pt in with c/o coughing up phlegm and blood today while at work, also c/o feeling sob when he moves.  States the last time he was coughing up blood he had pneumonia

## 2020-11-23 LAB — SARS CORONAVIRUS 2 (TAT 6-24 HRS): SARS Coronavirus 2: NEGATIVE

## 2021-03-21 ENCOUNTER — Ambulatory Visit (INDEPENDENT_AMBULATORY_CARE_PROVIDER_SITE_OTHER): Payer: 59 | Admitting: Otolaryngology

## 2021-03-30 ENCOUNTER — Emergency Department (HOSPITAL_BASED_OUTPATIENT_CLINIC_OR_DEPARTMENT_OTHER): Payer: 59

## 2021-03-30 ENCOUNTER — Emergency Department (HOSPITAL_BASED_OUTPATIENT_CLINIC_OR_DEPARTMENT_OTHER)
Admission: EM | Admit: 2021-03-30 | Discharge: 2021-03-30 | Disposition: A | Discharge status: Home / Self Care | Payer: 59 | Attending: Emergency Medicine | Admitting: Emergency Medicine

## 2021-03-30 ENCOUNTER — Other Ambulatory Visit: Payer: Self-pay

## 2021-03-30 DIAGNOSIS — S60222A Contusion of left hand, initial encounter: Secondary | ICD-10-CM | POA: Diagnosis not present

## 2021-03-30 DIAGNOSIS — Y92410 Unspecified street and highway as the place of occurrence of the external cause: Secondary | ICD-10-CM | POA: Diagnosis not present

## 2021-03-30 DIAGNOSIS — S59902A Unspecified injury of left elbow, initial encounter: Secondary | ICD-10-CM | POA: Diagnosis present

## 2021-03-30 DIAGNOSIS — F17210 Nicotine dependence, cigarettes, uncomplicated: Secondary | ICD-10-CM | POA: Diagnosis not present

## 2021-03-30 DIAGNOSIS — S5002XA Contusion of left elbow, initial encounter: Secondary | ICD-10-CM

## 2021-03-30 DIAGNOSIS — J40 Bronchitis, not specified as acute or chronic: Secondary | ICD-10-CM | POA: Insufficient documentation

## 2021-03-30 DIAGNOSIS — Z20822 Contact with and (suspected) exposure to covid-19: Secondary | ICD-10-CM | POA: Insufficient documentation

## 2021-03-30 MED ORDER — NAPROXEN 500 MG PO TABS: 500.0000 mg | ORAL_TABLET | Freq: Two times a day (BID) | ORAL | 0 refills | Status: DC
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
## 2021-03-30 NOTE — Discharge Instructions (Addendum)
1.  Follow-up with [facility] for recheck on your elbow.  Wear the sling as provided.  Take naproxen twice daily as needed for pain.  Use well wrapped ice pack to help with swelling for the next 2 days. 2.  Follow-up on your COVID results.  Be sure to mask and socially distance while awaiting your results.  Return to emergency department if you develop significant shortness of breath, chest pain or other concerning symptoms.

## 2021-03-31 LAB — SARS CORONAVIRUS 2 (TAT 6-24 HRS): SARS Coronavirus 2: NEGATIVE

## 2021-04-05 ENCOUNTER — Telehealth: Payer: 59 | Admitting: Emergency Medicine

## 2021-04-05 DIAGNOSIS — R059 Cough, unspecified: Secondary | ICD-10-CM

## 2021-04-05 MED ORDER — AZITHROMYCIN 250 MG PO TABS: ORAL_TABLET | ORAL | 0 refills | Status: DC

## 2021-04-05 MED ORDER — BENZONATATE 100 MG PO CAPS: 100.0000 mg | ORAL_CAPSULE | Freq: Two times a day (BID) | ORAL | 0 refills | Status: DC | PRN

## 2021-04-05 NOTE — Progress Notes (Signed)
Virtual Visit Consent   SANEL STEMMER, you are scheduled for a virtual visit with a [facility] provider today.     Just as with appointments in the office, your consent must be obtained to participate.  Your consent will be active for this visit and any virtual visit you may have with one of our providers in the next 365 days.     If you have a MyChart account, a copy of this consent can be sent to you electronically.  All virtual visits are billed to your insurance company just like a traditional visit in the office.    As this is a virtual visit, video technology does not allow for your provider to perform a traditional examination.  This may limit your provider's ability to fully assess your condition.  If your provider identifies any concerns that need to be evaluated in person or the need to arrange testing (such as labs, EKG, etc.), we will make arrangements to do so.     Although advances in technology are sophisticated, we cannot ensure that it will always work on either your end or our end.  If the connection with a video visit is poor, the visit may have to be switched to a telephone visit.  With either a video or telephone visit, we are not always able to ensure that we have a secure connection.     I need to obtain your verbal consent now.   Are you willing to proceed with your visit today?    Jorge Allen has provided verbal consent on 04/05/2021 for a virtual visit Video.   Roxy Horseman, PA-C   Date: 04/05/2021 9:11 AM   Virtual Visit via Video Note   IRoxy Horseman, connected [email or web address]@ ([id number], Oct 06, 1981) on 04/05/21 at  9:15 AM EDT by a video-enabled telemedicine application and verified that I am speaking with the correct person using two identifiers.  Location: Patient: Virtual Visit Location Patient: Home Provider: Virtual Visit Location Provider: Home Office   I discussed the limitations of evaluation and management by telemedicine and the  availability of in person appointments. The patient expressed understanding and agreed to proceed.    History of Present Illness: Jorge Allen is a 40 y.o. who identifies as a male who was assigned male at birth, and is being seen today for cough.  Has had productive cough.  COVID test negative. Seen at [facility] 6 days ago.  Had negative COVID and CXR.  Now having worsening cough.  Had a fever, but decreased.  HPI: HPI  Problems:  Patient Active Problem List   Diagnosis Date Noted   NONSPEC ELEVATION OF LEVELS OF TRANSAMINASE/LDH 12/21/2007   TOBACCO USE 12/17/2007   GERD 12/17/2007    Allergies: No Known Allergies Medications:  Current Outpatient Medications:    benzonatate (TESSALON) 100 MG capsule, Take 1 capsule (100 mg total) by mouth every 8 (eight) hours., Disp: 21 capsule, Rfl: 0   hydrocortisone (ANUSOL-HC) 25 MG suppository, Place 1 suppository (25 mg total) rectally 2 (two) times daily., Disp: 12 suppository, Rfl: 0   lidocaine-prilocaine (EMLA) cream, Apply 1 application topically as needed., Disp: 30 g, Rfl: 0   Naproxen (NAPROSYN PO), Take by mouth., Disp: , Rfl:    naproxen (NAPROSYN) 500 MG tablet, Take 1 tablet (500 mg total) by mouth 2 (two) times daily., Disp: 30 tablet, Rfl: 0   pramoxine (PROCTOFOAM) 1 % foam, Place 1 application rectally 3 (three) times daily as needed for anal  itching., Disp: 15 g, Rfl: 0   SUMAtriptan (IMITREX) 50 MG tablet, Take 1 tablet (50 mg total) by mouth every 2 (two) hours as needed for migraine (no more than 2 pills in 24/h)., Disp: 10 tablet, Rfl: 2   verapamil (CALAN) 40 MG tablet, 1 pill in AM for one week, then twice daily thereafter., Disp: 60 tablet, Rfl: 3  Observations/Objective: Patient is well-developed, well-nourished in no acute distress.  Resting comfortably at home.  Head is normocephalic, atraumatic.  No labored breathing. Coarse cough. Speech is clear and coherent with logical content.  Patient is alert and oriented at  baseline.    Assessment and Plan: 1. Cough   Follow Up Instructions: I discussed the assessment and treatment plan with the patient. The patient was provided an opportunity to ask questions and all were answered. The patient agreed with the plan and demonstrated an understanding of the instructions.  A copy of instructions were sent to the patient via MyChart.  The patient was advised to call back or seek an in-person evaluation if the symptoms worsen or if the condition fails to improve as anticipated.  Time:  I spent 15 minutes with the patient via telehealth technology discussing the above problems/concerns.    Roxy Horseman, PA-C

## 2021-04-05 NOTE — Patient Instructions (Signed)
We are sorry that you are not feeling well.  Here is how we plan to help!  Based on your presentation I believe you most likely have A cough due to bacteria.  When patients have a fever and a productive cough with a change in color or increased sputum production, we are concerned about bacterial bronchitis.  If left untreated it can progress to pneumonia.  If your symptoms do not improve with your treatment plan it is important that you contact your provider.   I have prescribed Azithromyin 250 mg: two tablets now and then one tablet daily for 4 additonal days    In addition you may use A prescription cough medication called Tessalon Perles 100mg . You may take 1-2 capsules every 8 hours as needed for your cough.      HOME CARE Only take medications as instructed by your medical team. Complete the entire course of an antibiotic. Drink plenty of fluids and get plenty of rest. Avoid close contacts especially the very young and the elderly Cover your mouth if you cough or cough into your sleeve. Always remember to wash your hands A steam or ultrasonic humidifier can help congestion.   GET HELP RIGHT AWAY IF: You develop worsening fever. You become short of breath You cough up blood. Your symptoms persist after you have completed your treatment plan MAKE SURE YOU  Understand these instructions. Will watch your condition. Will get help right away if you are not doing well or get worse.

## 2021-05-14 ENCOUNTER — Emergency Department: Payer: 59

## 2021-05-14 ENCOUNTER — Inpatient Hospital Stay
Admission: EM | Admit: 2021-05-14 | Discharge: 2021-05-16 | DRG: 854 | Disposition: A | Discharge status: Home / Self Care | Payer: 59 | Attending: Internal Medicine | Admitting: Internal Medicine

## 2021-05-14 ENCOUNTER — Inpatient Hospital Stay: Payer: 59 | Admitting: Anesthesiology

## 2021-05-14 ENCOUNTER — Other Ambulatory Visit: Payer: Self-pay

## 2021-05-14 ENCOUNTER — Encounter: Payer: Self-pay | Admitting: Intensive Care

## 2021-05-14 ENCOUNTER — Encounter: Payer: Self-pay | Admitting: Internal Medicine

## 2021-05-14 ENCOUNTER — Encounter
Admission: EM | Disposition: A | Discharge status: Home / Self Care | Payer: Self-pay | Source: Home / Self Care | Attending: Hospitalist

## 2021-05-14 DIAGNOSIS — G4733 Obstructive sleep apnea (adult) (pediatric): Secondary | ICD-10-CM | POA: Diagnosis present

## 2021-05-14 DIAGNOSIS — R32 Unspecified urinary incontinence: Secondary | ICD-10-CM | POA: Diagnosis not present

## 2021-05-14 DIAGNOSIS — F17210 Nicotine dependence, cigarettes, uncomplicated: Secondary | ICD-10-CM | POA: Diagnosis present

## 2021-05-14 DIAGNOSIS — R6520 Severe sepsis without septic shock: Secondary | ICD-10-CM

## 2021-05-14 DIAGNOSIS — K219 Gastro-esophageal reflux disease without esophagitis: Secondary | ICD-10-CM | POA: Diagnosis present

## 2021-05-14 DIAGNOSIS — Z6841 Body Mass Index (BMI) 40.0 and over, adult: Secondary | ICD-10-CM | POA: Diagnosis not present

## 2021-05-14 DIAGNOSIS — N5089 Other specified disorders of the male genital organs: Secondary | ICD-10-CM

## 2021-05-14 DIAGNOSIS — Z79899 Other long term (current) drug therapy: Secondary | ICD-10-CM

## 2021-05-14 DIAGNOSIS — N5082 Scrotal pain: Secondary | ICD-10-CM | POA: Diagnosis present

## 2021-05-14 DIAGNOSIS — Z791 Long term (current) use of non-steroidal anti-inflammatories (NSAID): Secondary | ICD-10-CM

## 2021-05-14 DIAGNOSIS — Z713 Dietary counseling and surveillance: Secondary | ICD-10-CM | POA: Diagnosis not present

## 2021-05-14 DIAGNOSIS — Z20822 Contact with and (suspected) exposure to covid-19: Secondary | ICD-10-CM | POA: Diagnosis present

## 2021-05-14 DIAGNOSIS — F17200 Nicotine dependence, unspecified, uncomplicated: Secondary | ICD-10-CM | POA: Diagnosis present

## 2021-05-14 DIAGNOSIS — A419 Sepsis, unspecified organism: Principal | ICD-10-CM | POA: Diagnosis present

## 2021-05-14 DIAGNOSIS — N179 Acute kidney failure, unspecified: Secondary | ICD-10-CM

## 2021-05-14 DIAGNOSIS — G4730 Sleep apnea, unspecified: Secondary | ICD-10-CM | POA: Insufficient documentation

## 2021-05-14 DIAGNOSIS — N492 Inflammatory disorders of scrotum: Secondary | ICD-10-CM | POA: Diagnosis present

## 2021-05-14 DIAGNOSIS — R300 Dysuria: Secondary | ICD-10-CM | POA: Diagnosis not present

## 2021-05-14 HISTORY — PX: INCISION AND DRAINAGE ABSCESS: SHX5864

## 2021-05-14 LAB — CBC WITH DIFFERENTIAL/PLATELET
Abs Immature Granulocytes: 0.09 10*3/uL — ABNORMAL HIGH (ref 0.00–0.07)
Basophils Absolute: 0 10*3/uL (ref 0.0–0.1)
Basophils Relative: 0 %
Eosinophils Absolute: 0.1 10*3/uL (ref 0.0–0.5)
Eosinophils Relative: 1 %
HCT: 41.3 % (ref 39.0–52.0)
Hemoglobin: 14 g/dL (ref 13.0–17.0)
Immature Granulocytes: 0 %
Lymphocytes Relative: 16 %
Lymphs Abs: 3.2 10*3/uL (ref 0.7–4.0)
MCH: 28.4 pg (ref 26.0–34.0)
MCHC: 33.9 g/dL (ref 30.0–36.0)
MCV: 83.8 fL (ref 80.0–100.0)
Monocytes Absolute: 1.7 10*3/uL — ABNORMAL HIGH (ref 0.1–1.0)
Monocytes Relative: 8 %
Neutro Abs: 15.1 10*3/uL — ABNORMAL HIGH (ref 1.7–7.7)
Neutrophils Relative %: 75 %
Platelets: 253 10*3/uL (ref 150–400)
RBC: 4.93 MIL/uL (ref 4.22–5.81)
RDW: 14.7 % (ref 11.5–15.5)
WBC: 20.2 10*3/uL — ABNORMAL HIGH (ref 4.0–10.5)
nRBC: 0 % (ref 0.0–0.2)

## 2021-05-14 LAB — COMPREHENSIVE METABOLIC PANEL
ALT: 20 U/L (ref 0–44)
AST: 17 U/L (ref 15–41)
Albumin: 3.7 g/dL (ref 3.5–5.0)
Alkaline Phosphatase: 95 U/L (ref 38–126)
Anion gap: 8 (ref 5–15)
BUN: 16 mg/dL (ref 6–20)
CO2: 28 mmol/L (ref 22–32)
Calcium: 8.9 mg/dL (ref 8.9–10.3)
Chloride: 100 mmol/L (ref 98–111)
Creatinine, Ser: 0.82 mg/dL (ref 0.61–1.24)
GFR, Estimated: >60 mL/min (ref >60)
Glucose, Bld: 119 mg/dL — ABNORMAL HIGH (ref 70–99)
Potassium: 3.9 mmol/L (ref 3.5–5.1)
Sodium: 136 mmol/L (ref 135–145)
Total Bilirubin: 0.7 mg/dL (ref 0.3–1.2)
Total Protein: 7.2 g/dL (ref 6.5–8.1)

## 2021-05-14 LAB — T4, FREE: Free T4: 1.33 ng/dL — ABNORMAL HIGH (ref 0.61–1.12)

## 2021-05-14 LAB — RESP PANEL BY RT-PCR (FLU A&B, COVID) ARPGX2
Influenza A by PCR: NEGATIVE
Influenza B by PCR: NEGATIVE
SARS Coronavirus 2 by RT PCR: NEGATIVE

## 2021-05-14 LAB — LACTIC ACID, PLASMA: Lactic Acid, Venous: 0.6 mmol/L (ref 0.5–1.9)

## 2021-05-14 LAB — TSH: TSH: 1.205 u[IU]/mL (ref 0.350–4.500)

## 2021-05-14 SURGERY — INCISION AND DRAINAGE, ABSCESS
Anesthesia: General | Laterality: Right

## 2021-05-14 MED ORDER — PIPERACILLIN-TAZOBACTAM 3.375 G IVPB
3.3750 g | Freq: Three times a day (TID) | INTRAVENOUS | Status: DC
  Administered 2021-05-15 – 2021-05-16 (×5): 3.375 g via INTRAVENOUS
  Filled 2021-05-14 (×5): qty 50

## 2021-05-14 MED ORDER — MORPHINE SULFATE (PF) 4 MG/ML IV SOLN
4.0000 mg | Freq: Once | INTRAVENOUS | Status: AC
Start: 2021-05-14 — End: 2021-05-14
  Administered 2021-05-14: 4 mg via INTRAVENOUS
  Filled 2021-05-14: qty 1

## 2021-05-14 MED ORDER — VANCOMYCIN HCL IN DEXTROSE 1-5 GM/200ML-% IV SOLN
1000.0000 mg | Freq: Once | INTRAVENOUS | Status: AC
  Administered 2021-05-14: 1000 mg via INTRAVENOUS
  Filled 2021-05-14: qty 200

## 2021-05-14 MED ORDER — MIDAZOLAM HCL 2 MG/2ML IJ SOLN
INTRAMUSCULAR | Status: DC | PRN
  Administered 2021-05-14: 2 mg via INTRAVENOUS

## 2021-05-14 MED ORDER — DEXAMETHASONE SODIUM PHOSPHATE 10 MG/ML IJ SOLN
INTRAMUSCULAR | Status: DC | PRN
  Administered 2021-05-14: 10 mg via INTRAVENOUS

## 2021-05-14 MED ORDER — IOHEXOL 300 MG/ML  SOLN
125.0000 mL | Freq: Once | INTRAMUSCULAR | Status: AC | PRN
  Administered 2021-05-14: 125 mL via INTRAVENOUS
  Filled 2021-05-14: qty 125

## 2021-05-14 MED ORDER — MIDAZOLAM HCL 2 MG/2ML IJ SOLN
INTRAMUSCULAR | Status: AC
  Filled 2021-05-14: qty 2

## 2021-05-14 MED ORDER — PIPERACILLIN-TAZOBACTAM 3.375 G IVPB 30 MIN
3.3750 g | Freq: Once | INTRAVENOUS | Status: AC
  Administered 2021-05-14: 3.375 g via INTRAVENOUS
  Filled 2021-05-14: qty 50

## 2021-05-14 MED ORDER — LIDOCAINE HCL (PF) 2 % IJ SOLN
INTRAMUSCULAR | Status: AC
  Filled 2021-05-14: qty 5

## 2021-05-14 MED ORDER — OXYCODONE HCL 5 MG PO TABS
5.0000 mg | ORAL_TABLET | Freq: Once | ORAL | Status: DC | PRN
Start: 2021-05-14 — End: 2021-05-14

## 2021-05-14 MED ORDER — LACTATED RINGERS IV SOLN: INTRAVENOUS | Status: DC | PRN

## 2021-05-14 MED ORDER — OXYCODONE HCL 5 MG/5ML PO SOLN: 5.0000 mg | Freq: Once | ORAL | Status: DC | PRN

## 2021-05-14 MED ORDER — KETOROLAC TROMETHAMINE 15 MG/ML IJ SOLN
15.0000 mg | Freq: Three times a day (TID) | INTRAMUSCULAR | Status: DC | PRN
  Administered 2021-05-15 – 2021-05-16 (×2): 15 mg via INTRAVENOUS
  Filled 2021-05-14 (×2): qty 1

## 2021-05-14 MED ORDER — MORPHINE SULFATE (PF) 2 MG/ML IV SOLN
2.0000 mg | INTRAVENOUS | Status: DC | PRN
  Administered 2021-05-15 (×2): 2 mg via INTRAVENOUS
  Filled 2021-05-14 (×3): qty 1

## 2021-05-14 MED ORDER — LIDOCAINE HCL (CARDIAC) PF 100 MG/5ML IV SOSY
PREFILLED_SYRINGE | INTRAVENOUS | Status: DC | PRN
  Administered 2021-05-14: 100 mg via INTRAVENOUS

## 2021-05-14 MED ORDER — ACETAMINOPHEN 10 MG/ML IV SOLN: 1000.0000 mg | Freq: Once | INTRAVENOUS | Status: DC | PRN

## 2021-05-14 MED ORDER — PROPOFOL 10 MG/ML IV BOLUS
INTRAVENOUS | Status: AC
  Filled 2021-05-14: qty 40

## 2021-05-14 MED ORDER — SUCCINYLCHOLINE CHLORIDE 200 MG/10ML IV SOSY
PREFILLED_SYRINGE | INTRAVENOUS | Status: AC
  Filled 2021-05-14: qty 10

## 2021-05-14 MED ORDER — PROPOFOL 10 MG/ML IV BOLUS
INTRAVENOUS | Status: DC | PRN
Start: 2021-05-14 — End: 2021-05-14
  Administered 2021-05-14: 20 mg via INTRAVENOUS
  Administered 2021-05-14: 200 mg via INTRAVENOUS

## 2021-05-14 MED ORDER — FENTANYL CITRATE (PF) 100 MCG/2ML IJ SOLN
INTRAMUSCULAR | Status: AC
  Filled 2021-05-14: qty 2

## 2021-05-14 MED ORDER — HEPARIN SODIUM (PORCINE) 5000 UNIT/ML IJ SOLN
5000.0000 [IU] | Freq: Three times a day (TID) | INTRAMUSCULAR | Status: DC
  Administered 2021-05-15 (×2): 5000 [IU] via SUBCUTANEOUS
  Filled 2021-05-14 (×2): qty 1

## 2021-05-14 MED ORDER — SUCCINYLCHOLINE CHLORIDE 200 MG/10ML IV SOSY
PREFILLED_SYRINGE | INTRAVENOUS | Status: DC | PRN
  Administered 2021-05-14: 140 mg via INTRAVENOUS

## 2021-05-14 MED ORDER — FENTANYL CITRATE (PF) 100 MCG/2ML IJ SOLN
INTRAMUSCULAR | Status: DC | PRN
  Administered 2021-05-14: 50 ug via INTRAVENOUS
  Administered 2021-05-14: 100 ug via INTRAVENOUS
  Administered 2021-05-14: 50 ug via INTRAVENOUS

## 2021-05-14 MED ORDER — PHENYLEPHRINE HCL (PRESSORS) 10 MG/ML IV SOLN
INTRAVENOUS | Status: DC | PRN
  Administered 2021-05-14: 100 ug via INTRAVENOUS

## 2021-05-14 MED ORDER — VANCOMYCIN HCL 1500 MG/300ML IV SOLN
1500.0000 mg | Freq: Once | INTRAVENOUS | Status: DC
  Administered 2021-05-15: 1500 mg via INTRAVENOUS
  Filled 2021-05-14 (×2): qty 300

## 2021-05-14 MED ORDER — 0.9 % SODIUM CHLORIDE (POUR BTL) OPTIME
TOPICAL | Status: DC | PRN
  Administered 2021-05-14: 500 mL

## 2021-05-14 MED ORDER — WHITE PETROLATUM EX OINT
TOPICAL_OINTMENT | CUTANEOUS | Status: AC
  Filled 2021-05-14: qty 5

## 2021-05-14 MED ORDER — OXYCODONE HCL 5 MG PO TABS
5.0000 mg | ORAL_TABLET | ORAL | Status: DC | PRN
  Administered 2021-05-15 – 2021-05-16 (×2): 10 mg via ORAL
  Filled 2021-05-14 (×2): qty 2

## 2021-05-14 MED ORDER — ONDANSETRON HCL 4 MG/2ML IJ SOLN
INTRAMUSCULAR | Status: DC | PRN
  Administered 2021-05-14: 4 mg via INTRAVENOUS

## 2021-05-14 MED ORDER — OXYCODONE-ACETAMINOPHEN 5-325 MG PO TABS
1.0000 | ORAL_TABLET | ORAL | Status: DC | PRN
  Administered 2021-05-14: 1 via ORAL
  Filled 2021-05-14: qty 1

## 2021-05-14 MED ORDER — SODIUM CHLORIDE 0.45 % IV SOLN: INTRAVENOUS | Status: DC

## 2021-05-14 MED ORDER — FENTANYL CITRATE (PF) 100 MCG/2ML IJ SOLN: 25.0000 ug | INTRAMUSCULAR | Status: DC | PRN

## 2021-05-14 MED ORDER — ONDANSETRON HCL 4 MG/2ML IJ SOLN: 4.0000 mg | Freq: Once | INTRAMUSCULAR | Status: DC | PRN

## 2021-05-14 MED ORDER — LACTATED RINGERS IV BOLUS
500.0000 mL | Freq: Once | INTRAVENOUS | Status: AC
  Administered 2021-05-14: 500 mL via INTRAVENOUS

## 2021-05-14 SURGICAL SUPPLY — 41 items
BNDG CONFORM 2 STRL LF (GAUZE/BANDAGES/DRESSINGS) IMPLANT
CHLORAPREP W/TINT 26 (MISCELLANEOUS) IMPLANT
DERMABOND ADVANCED (GAUZE/BANDAGES/DRESSINGS)
DERMABOND ADVANCED .7 DNX12 (GAUZE/BANDAGES/DRESSINGS) IMPLANT
DRAIN PENROSE 12X.25 LTX STRL (MISCELLANEOUS) IMPLANT
DRAPE LAPAROTOMY 77X122 PED (DRAPES) ×2 IMPLANT
DRAPE LEGGINS SURG 28X43 STRL (DRAPES) ×2 IMPLANT
DRAPE UNDER BUTTOCK W/FLU (DRAPES) ×2 IMPLANT
DRSG GAUZE FLUFF 36X18 (GAUZE/BANDAGES/DRESSINGS) ×2 IMPLANT
ELECT REM PT RETURN 9FT ADLT (ELECTROSURGICAL) ×2
ELECTRODE REM PT RTRN 9FT ADLT (ELECTROSURGICAL) ×1 IMPLANT
GAUZE 4X4 16PLY ~~LOC~~+RFID DBL (SPONGE) IMPLANT
GAUZE PACKING IODOFORM 1/2 (PACKING) ×2 IMPLANT
GAUZE SPONGE 4X4 12PLY STRL (GAUZE/BANDAGES/DRESSINGS) IMPLANT
GLOVE SURG UNDER POLY LF SZ7.5 (GLOVE) ×2 IMPLANT
GOWN STRL REUS W/ TWL LRG LVL3 (GOWN DISPOSABLE) ×1 IMPLANT
GOWN STRL REUS W/ TWL XL LVL3 (GOWN DISPOSABLE) ×1 IMPLANT
GOWN STRL REUS W/TWL LRG LVL3 (GOWN DISPOSABLE) ×2
GOWN STRL REUS W/TWL XL LVL3 (GOWN DISPOSABLE) ×2
KIT TURNOVER KIT A (KITS) ×2 IMPLANT
LABEL OR SOLS (LABEL) IMPLANT
MANIFOLD NEPTUNE II (INSTRUMENTS) ×2 IMPLANT
NEEDLE HYPO 25X1 1.5 SAFETY (NEEDLE) IMPLANT
NS IRRIG 500ML POUR BTL (IV SOLUTION) ×2 IMPLANT
PACK BASIN MINOR ARMC (MISCELLANEOUS) ×2 IMPLANT
PAD ABD DERMACEA PRESS 5X9 (GAUZE/BANDAGES/DRESSINGS) ×2 IMPLANT
SOL PREP PVP 2OZ (MISCELLANEOUS)
SOLUTION PREP PVP 2OZ (MISCELLANEOUS) IMPLANT
SPONGE T-LAP 18X18 ~~LOC~~+RFID (SPONGE) ×4 IMPLANT
SUPPORETR ATHLETIC LG (MISCELLANEOUS) IMPLANT
SUPPORTER ATHLETIC LG (MISCELLANEOUS)
SUPPORTER ATHLETIC XL (MISCELLANEOUS) ×2
SUPPORTER ATHLETIC XL 3X44-50X (MISCELLANEOUS) ×1 IMPLANT
SUT ETHILON 3-0 FS-10 30 BLK (SUTURE)
SUT VIC AB 3-0 SH 27 (SUTURE)
SUT VIC AB 3-0 SH 27X BRD (SUTURE) IMPLANT
SUT VIC AB 4-0 SH 27 (SUTURE)
SUT VIC AB 4-0 SH 27XANBCTRL (SUTURE) IMPLANT
SUTURE EHLN 3-0 FS-10 30 BLK (SUTURE) IMPLANT
SYR 10ML LL (SYRINGE) ×2 IMPLANT
SYR BULB IRRIG 60ML STRL (SYRINGE) ×2 IMPLANT

## 2021-05-14 NOTE — ED Provider Notes (Signed)
[facility] Emergency Department Provider Note  ____________________________________________  Time seen: Approximately 4:08 PM  I have reviewed the triage vital signs and the nursing notes.   HISTORY  Chief Complaint Testicle Pain    HPI Jorge Allen is a 40 y.o. male who presents the emergency department complaining of right scrotal pain, edema with pain radiating into the right lower abdomen.  Patient states that he has had what appears to be a cyst on the scrotal wall for some time.  He states that every once in a while will enlarge, he will squeeze it, will leak clear fluid and then resolved.  Over the last 4 days he did not have any enlargement of the cystlike structure but developed pain in his right testicle/scrotal region.  He has had swelling of the scrotum on the right side with increased right scrotal/testicular pain radiating into the right groin.  He has no history of hernia.  Denies any dysuria, polyuria, hematuria.  No flank pain.  No nausea, vomiting, diarrhea or constipation.  No medications at home prior to arrival.       Past Medical History:  Diagnosis Date   Sleep apnea     Patient Active Problem List   Diagnosis Date Noted   Scrotal pain 05/14/2021   Sepsis (HCC) 05/14/2021   Sleep apnea 05/14/2021   NONSPEC ELEVATION OF LEVELS OF TRANSAMINASE/LDH 12/21/2007   TOBACCO USE 12/17/2007   GERD 12/17/2007    Past Surgical History:  Procedure Laterality Date   FOOT SURGERY     TONSILLECTOMY      Prior to Admission medications   Medication Sig Start Date End Date Taking? Authorizing Provider  azithromycin (ZITHROMAX) 250 MG tablet Take 2 tabs today, then take 1 tab daily until gone. 04/05/21   Roxy Horseman, PA-C  benzonatate (TESSALON) 100 MG capsule Take 1 capsule (100 mg total) by mouth 2 (two) times daily as needed for cough. 04/05/21   Roxy Horseman, PA-C  hydrocortisone (ANUSOL-HC) 25 MG suppository Place 1 suppository  (25 mg total) rectally 2 (two) times daily. 05/21/20   Enid Derry, PA-C  lidocaine-prilocaine (EMLA) cream Apply 1 application topically as needed. 05/21/20   Enid Derry, PA-C  Naproxen (NAPROSYN PO) Take by mouth.    [provider]  naproxen (NAPROSYN) 500 MG tablet Take 1 tablet (500 mg total) by mouth 2 (two) times daily. 03/30/21   Arby Barrette, MD  pramoxine (PROCTOFOAM) 1 % foam Place 1 application rectally 3 (three) times daily as needed for anal itching. 05/21/20   Enid Derry, PA-C  SUMAtriptan (IMITREX) 50 MG tablet Take 1 tablet (50 mg total) by mouth every 2 (two) hours as needed for migraine (no more than 2 pills in 24/h). 02/16/20   Huston Foley, MD  verapamil (CALAN) 40 MG tablet 1 pill in AM for one week, then twice daily thereafter. 02/16/20   Huston Foley, MD    Allergies Patient has no known allergies.  History reviewed. No pertinent family history.  Social History Social History   Tobacco Use   Smoking status: Every Day    Types: Cigarettes   Smokeless tobacco: Never  Vaping Use   Vaping Use: Never used  Substance Use Topics   Alcohol use: Yes    Alcohol/week: 10.0 standard drinks    Types: 10 Shots of liquor per week    Comment: half gallon liquor   Drug use: No     Review of Systems  Constitutional: No fever/chills Eyes: No  visual changes. No discharge ENT: No upper respiratory complaints. Cardiovascular: no chest pain. Respiratory: no cough. No SOB. Gastrointestinal: Right lower quadrant/right pelvic pain leading into the scrotum/testicle.  No nausea, no vomiting.  No diarrhea.  No constipation. Genitourinary: Negative for dysuria. No hematuria.  Right scrotal edema with right scrotal/testicular pain on the right side radiating into the right abdomen Musculoskeletal: Negative for musculoskeletal pain. Skin: Negative for rash, abrasions, lacerations, ecchymosis. Neurological: Negative for headaches, focal weakness or numbness.  10  System ROS otherwise negative.  ____________________________________________   PHYSICAL EXAM:  VITAL SIGNS: ED Triage Vitals [05/14/21 1334]  Enc Vitals Group     BP (!) 156/91     Pulse Rate (!) 104     Resp 20     Temp 99 F (37.2 C)     Temp Source Oral     SpO2 96 %     Weight (!) 315 lb (142.9 kg)     Height  (1.651 m)     Head Circumference      Peak Flow      Pain Score 6     Pain Loc      Pain Edu?      Excl. in GC?      Constitutional: Alert and oriented. Well appearing and in no acute distress. Eyes: Conjunctivae are normal. PERRL. EOMI. Head: Atraumatic. ENT:      Ears:       Nose: No congestion/rhinnorhea.      Mouth/Throat: Mucous membranes are moist.  Neck: No stridor.    Cardiovascular: Normal rate, regular rhythm. Normal S1 and S2.  Good peripheral circulation. Respiratory: Normal respiratory effort without tachypnea or retractions. Lungs CTAB. Good air entry to the bases with no decreased or absent breath sounds. Gastrointestinal: Bowel sounds 4 quadrants.  Soft to palpation all quadrants.  Patient is tender in the right suprapubic/right lower quadrant region.  This is low pelvic/lower quadrant tenderness.. No guarding or rigidity. No palpable masses. No distention. No CVA tenderness. Genitourinary: Visualization of the right inguinal/scrotal region reveals gross edema about the scrotum worse on the right than left.  There is some changes with firmness and what appears to be fluctuance along the right scrotal wall.  Scrotum is grossly edematous.  Testicles are unable to be appreciated on palpation. Musculoskeletal: Full range of motion to all extremities. No gross deformities appreciated. Neurologic:  Normal speech and language. No gross focal neurologic deficits are appreciated.  Skin:  Skin is warm, dry and intact. No rash noted. Psychiatric: Mood and affect are normal. Speech and behavior are normal. Patient exhibits appropriate insight and  judgement.   ____________________________________________   LABS (all labs ordered are listed, but only abnormal results are displayed)  Labs Reviewed  CBC WITH DIFFERENTIAL/PLATELET - Abnormal; Notable for the following components:      Result Value   WBC 20.2 (*)    Neutro Abs 15.1 (*)    Monocytes Absolute 1.7 (*)    Abs Immature Granulocytes 0.09 (*)    All other components within normal limits  COMPREHENSIVE METABOLIC PANEL - Abnormal; Notable for the following components:   Glucose, Bld 119 (*)    All other components within normal limits  RESP PANEL BY RT-PCR (FLU A&B, COVID) ARPGX2  CULTURE, BLOOD (ROUTINE X 2)  CULTURE, BLOOD (ROUTINE X 2)  LACTIC ACID, PLASMA  HEMOGLOBIN A1C  C-REACTIVE PROTEIN  TSH  T4, FREE  MAGNESIUM  HIV ANTIBODY (ROUTINE TESTING W REFLEX)  COMPREHENSIVE METABOLIC  PANEL  CBC   ____________________________________________  EKG   ____________________________________________  RADIOLOGY I personally viewed and evaluated these images as part of my medical decision making, as well as reviewing the written report by the radiologist.  ED Provider Interpretation: Initial ultrasound was concerning for possible hernia, however physical exam was concerning mostly for infection.  CT scan was obtained which revealed findings consistent with abscess.  Given the amount of edema and infection there is some concern for foreign years even though there is no evidence of soft tissue gas at this time.  CT ABDOMEN PELVIS W CONTRAST  Result Date: 05/14/2021 CLINICAL DATA:  RIGHT lower quadrant abdominal pain, RIGHT-sided scrotal edema and pain radiating into the RIGHT abdomen. EXAM: CT ABDOMEN AND PELVIS WITH CONTRAST TECHNIQUE: Multidetector CT imaging of the abdomen and pelvis was performed using the standard protocol following bolus administration of intravenous contrast. CONTRAST:  OMNIPAQUE IOHEXOL 300 MG/ML  SOLN COMPARISON:  Scrotal sonogram of the  same date. FINDINGS: Lower chest: Lung bases are clear. No effusion. No consolidative changes. Hepatobiliary: Lobular hepatic contours. Mild hepatomegaly. 19 cm greatest craniocaudal dimension. Background hepatic steatosis. No focal lesion. No pericholecystic stranding. No biliary duct dilation. Portal vein is patent. Pancreas: Normal, without mass, inflammation or ductal dilatation. Spleen: Spleen normal size and contour. Adrenals/Urinary Tract: Adrenal glands are normal. Symmetric renal enhancement. No hydronephrosis. Smooth contour of the urinary bladder. Stomach/Bowel: No acute gastrointestinal process. Appendix is normal. Vascular/Lymphatic: Abdominal aorta with normal caliber. Smooth contour the IVC. There is no gastrohepatic or hepatoduodenal ligament lymphadenopathy. No retroperitoneal or mesenteric lymphadenopathy. Prominent bilateral pelvic lymph nodes up to 16 mm short axis dimension. Mild enlargement of pelvic sidewall lymph nodes but less than a cm short axis. Scattered external iliac lymph nodes Reproductive: Prostate unremarkable. Stranding extending throughout the scrotum worse on the RIGHT. Stranding extending from the scrotum to the base of the penis and into the inguinal crease, slightly and greater on the RIGHT than the LEFT along the inferior inguinal crease also along the perineum. No gas in the soft tissues. The most inferior aspect of the scrotum is excluded from view. This is only a small area of the scrotum. Heterogeneous area with peripheral enhancement and central low attenuation in the RIGHT hemiscrotum measuring 3.3 x 2.3 cm axial dimension and 2.5 cm greatest craniocaudal dimension. Diffuse scrotal edema and stranding in the scrotum elsewhere. Other: No ascites. Musculoskeletal: No acute musculoskeletal process. Spinal degenerative changes. Bilateral flank edema, mild. IMPRESSION: 1. Scrotal abscess versus complex phlegmon in the RIGHT hemiscrotum with extensive stranding about the  scrotum and perineum extending to the base of the penis. No gas in the soft tissues. 2. Given constellation of findings early aggressive soft tissue infection or Fournier's gangrene is considered. Correlate with any recent rapid worsening clinical symptoms and suggest urologic consultation for further evaluation. No gas currently noted in soft tissues. 3. Prominent bilateral pelvic lymph nodes are likely reactive. 4. Hepatic steatosis with lobular hepatic contours. These results were called by telephone at the time of interpretation on 05/14/2021 at 5:41 pm to provider Minna Antis, MD, who verbally acknowledged these results. Electronically Signed   By: Donzetta Kohut M.D.   On: 05/14/2021 17:42   US SCROTUM W/DOPPLER  Result Date: 05/14/2021 CLINICAL DATA:  Acute scrotal pain and swelling. EXAM: SCROTAL ULTRASOUND DOPPLER ULTRASOUND OF THE TESTICLES TECHNIQUE: Complete ultrasound examination of the testicles, epididymis, and other scrotal structures was performed. Color and spectral Doppler ultrasound were also utilized to evaluate blood  flow to the testicles. COMPARISON:  None. FINDINGS: Right testicle Measurements: 4.7 x 2.6 x 2.3 cm. No mass or microlithiasis visualized. Left testicle Measurements: 4.5 x 2.9 x 2.3 cm. No mass or microlithiasis visualized. Right epididymis:  5 mm epididymal cyst is noted. Left epididymis:  Normal in size and appearance. Hydrocele:  None visualized. Varicocele:  Small left-sided varicocele is noted. Pulsed Doppler interrogation of both testes demonstrates normal low resistance arterial and venous waveforms bilaterally. Complex abnormality is seen lateral to the right scrotal region which corresponds to area of tenderness. This is concerning for large right inguinal hernia which contains loop of bowel. IMPRESSION: No evidence of testicular mass or torsion. Complex abnormality is seen lateral to the right scrotal region which corresponds to area of palpable tenderness. This  is concerning for large right inguinal hernia which contains loop of bowel. CT scan is recommended for further evaluation. Small left-sided varicocele. Electronically Signed   By: Lupita Raider M.D.   On: 05/14/2021 15:50    ____________________________________________    PROCEDURES  Procedure(s) performed:    Procedures    Medications  oxyCODONE-acetaminophen (PERCOCET/ROXICET) 5-325 MG per tablet 1 tablet (1 tablet Oral Given 05/14/21 1341)  lactated ringers bolus 500 mL (has no administration in time range)  vancomycin (VANCOCIN) IVPB 1000 mg/200 mL premix (has no administration in time range)    Followed by  vancomycin (VANCOREADY) IVPB 1500 mg/300 mL (has no administration in time range)  piperacillin-tazobactam (ZOSYN) IVPB 3.375 g (3.375 g Intravenous New Bag/Given 05/14/21 1914)  heparin injection 5,000 Units (has no administration in time range)  0.45 % sodium chloride infusion (has no administration in time range)  iohexol (OMNIPAQUE) 300 MG/ML solution 125 mL (125 mLs Intravenous Contrast Given 05/14/21 1641)  morphine 4 MG/ML injection 4 mg (4 mg Intravenous Given 05/14/21 1910)     ____________________________________________   INITIAL IMPRESSION / ASSESSMENT AND PLAN / ED COURSE  Pertinent labs & imaging results that were available during my care of the patient were reviewed by me and considered in my medical decision making (see chart for details).  Review of the [place] CSRS was performed in accordance of the [facility] prior to dispensing any controlled drugs.           Patient's diagnosis is consistent with scrotal abscess.  Patient presented to the emergency department with right-sided scrotal pain.  Initial work-up revealed elevated white blood cell count and findings on ultrasound were concerning for incarcerated hernia.  Physical exam is more concerning for scrotal wall abscess versus foreign years.  CT scan was obtained which reveals large amount of infection with  what appears to be an abscess in the right scrotum.  Clinically there was concern for possible Fournier's given the amount of edema surrounding this skin finding of the scrotum.  However there was no soft tissue gas on CT.  At this time patient will be admitted with IV antibiotics, fluids and pain medication and further management by urology.  Patient did arrive with a pulse rate of 93, elevated white blood cell count of 20,000 and does meet sepsis criteria at this point.  Urology plans to see the patient tonight and likely take them to the OR for drainage/exploration.  We will admit to the hospital service at this time.    ____________________________________________  FINAL CLINICAL IMPRESSION(S) / ED DIAGNOSES  Final diagnoses:  Scrotal abscess  Sepsis without acute organ dysfunction, due to unspecified organism (HCC)      NEW MEDICATIONS STARTED  DURING THIS VISIT:  ED Discharge Orders     None           This chart was dictated using voice recognition software/Dragon. Despite best efforts to proofread, errors can occur which can change the meaning. Any change was purely unintentional.    Racheal PatchesCuthriell, Kenzi Bardwell D, PA-C 05/14/21 1925    Sharyn CreamerQuale, Mark, MD 05/14/21 Corky 1956

## 2021-05-14 NOTE — ED Provider Notes (Signed)
Medical screening examination/treatment/procedure(s) were conducted as a shared visit with non-physician practitioner(s) and myself.  I personally evaluated the patient during the encounter. Personally collected history, exam, and developed decision making and consulted urology in conjunction with PA, Cuthriell.   ----------------------------------------- 5:50 PM on 05/14/2021 -----------------------------------------  Vitals:   05/14/21 1334 05/14/21 1530  BP: (!) 156/91 123/69  Pulse: (!) 104 93  Resp: 20 19  Temp: 99 F (37.2 C)   SpO2: 96% 95%     Patient meets sepsis criteria.  Broad-spectrum antibiotics ordered.  Code sepsis initiated.  Lactic acid ordered, currently not hypotensive.  No altered mental status.  Clinically I do not suspect severe sepsis.  There is a high concern for scrotal infection including a potential for Fournier's gangrene.  Case has been addressed with Dr. Lonna Cobb of urology who is aware of the patient, admission, and urgent consultation request.  Patient is understanding agreeable plan for admission.  Does request something for pain reports discomfort along the right side of the scrotum.  On clinical exam he does have an area of induration along the right side of the scrotum with moderate edema in this region as well.  His clinical exam and history are certainly concerning for scrotal abscess, phlegmon, cellulitis or potentially developing Fournier's.  CT ABDOMEN PELVIS W CONTRAST  Result Date: 05/14/2021 CLINICAL DATA:  RIGHT lower quadrant abdominal pain, RIGHT-sided scrotal edema and pain radiating into the RIGHT abdomen. EXAM: CT ABDOMEN AND PELVIS WITH CONTRAST TECHNIQUE: Multidetector CT imaging of the abdomen and pelvis was performed using the standard protocol following bolus administration of intravenous contrast. CONTRAST:  OMNIPAQUE IOHEXOL 300 MG/ML  SOLN COMPARISON:  Scrotal sonogram of the same date. FINDINGS: Lower chest: Lung bases are clear.  No effusion. No consolidative changes. Hepatobiliary: Lobular hepatic contours. Mild hepatomegaly. 19 cm greatest craniocaudal dimension. Background hepatic steatosis. No focal lesion. No pericholecystic stranding. No biliary duct dilation. Portal vein is patent. Pancreas: Normal, without mass, inflammation or ductal dilatation. Spleen: Spleen normal size and contour. Adrenals/Urinary Tract: Adrenal glands are normal. Symmetric renal enhancement. No hydronephrosis. Smooth contour of the urinary bladder. Stomach/Bowel: No acute gastrointestinal process. Appendix is normal. Vascular/Lymphatic: Abdominal aorta with normal caliber. Smooth contour the IVC. There is no gastrohepatic or hepatoduodenal ligament lymphadenopathy. No retroperitoneal or mesenteric lymphadenopathy. Prominent bilateral pelvic lymph nodes up to 16 mm short axis dimension. Mild enlargement of pelvic sidewall lymph nodes but less than a cm short axis. Scattered external iliac lymph nodes Reproductive: Prostate unremarkable. Stranding extending throughout the scrotum worse on the RIGHT. Stranding extending from the scrotum to the base of the penis and into the inguinal crease, slightly and greater on the RIGHT than the LEFT along the inferior inguinal crease also along the perineum. No gas in the soft tissues. The most inferior aspect of the scrotum is excluded from view. This is only a small area of the scrotum. Heterogeneous area with peripheral enhancement and central low attenuation in the RIGHT hemiscrotum measuring 3.3 x 2.3 cm axial dimension and 2.5 cm greatest craniocaudal dimension. Diffuse scrotal edema and stranding in the scrotum elsewhere. Other: No ascites. Musculoskeletal: No acute musculoskeletal process. Spinal degenerative changes. Bilateral flank edema, mild. IMPRESSION: 1. Scrotal abscess versus complex phlegmon in the RIGHT hemiscrotum with extensive stranding about the scrotum and perineum extending to the base of the penis. No  gas in the soft tissues. 2. Given constellation of findings early aggressive soft tissue infection or Fournier's gangrene is considered. Correlate with any recent  rapid worsening clinical symptoms and suggest urologic consultation for further evaluation. No gas currently noted in soft tissues. 3. Prominent bilateral pelvic lymph nodes are likely reactive. 4. Hepatic steatosis with lobular hepatic contours. These results were called by telephone at the time of interpretation on 05/14/2021 at 5:41 pm to provider Minna Antis, MD, who verbally acknowledged these results. Electronically Signed   By: Donzetta Kohut M.D.   On: 05/14/2021 17:42   US SCROTUM W/DOPPLER  Result Date: 05/14/2021 CLINICAL DATA:  Acute scrotal pain and swelling. EXAM: SCROTAL ULTRASOUND DOPPLER ULTRASOUND OF THE TESTICLES TECHNIQUE: Complete ultrasound examination of the testicles, epididymis, and other scrotal structures was performed. Color and spectral Doppler ultrasound were also utilized to evaluate blood flow to the testicles. COMPARISON:  None. FINDINGS: Right testicle Measurements: 4.7 x 2.6 x 2.3 cm. No mass or microlithiasis visualized. Left testicle Measurements: 4.5 x 2.9 x 2.3 cm. No mass or microlithiasis visualized. Right epididymis:  5 mm epididymal cyst is noted. Left epididymis:  Normal in size and appearance. Hydrocele:  None visualized. Varicocele:  Small left-sided varicocele is noted. Pulsed Doppler interrogation of both testes demonstrates normal low resistance arterial and venous waveforms bilaterally. Complex abnormality is seen lateral to the right scrotal region which corresponds to area of tenderness. This is concerning for large right inguinal hernia which contains loop of bowel. IMPRESSION: No evidence of testicular mass or torsion. Complex abnormality is seen lateral to the right scrotal region which corresponds to area of palpable tenderness. This is concerning for large right inguinal hernia which  contains loop of bowel. CT scan is recommended for further evaluation. Small left-sided varicocele. Electronically Signed   By: Lupita Raider M.D.   On: 05/14/2021 15:50    Imaging studies reviewed as above, no evidence of acute torsion.  CT imaging reviewed, bolded as above Key findings   Hi Dr. Lonna Cobb advises to keep patient n.p.o., admit to hospitalist service, will be in to see in consult on patient and having reviewed reports the patient may need operative intervention this evening- in person consult pending with urology.  CRITICAL CARE Performed by: Sharyn Creamer   Total critical care time: 25 minutes  Critical care time was exclusive of separately billable procedures and treating other patients.  Critical care was necessary to treat or prevent imminent or life-threatening deterioration.  Patient with concern for potential Fournier's.  Urology consult required coordinated with urologist, ordered and reviewed labs, initiated code sepsis, antibiotic orders, examined patient, discussed results.  Critical care was time spent personally by me on the following activities: development of treatment plan with patient and/or surrogate as well as nursing, discussions with consultants, evaluation of patient's response to treatment, examination of patient, obtaining history from patient or surrogate, ordering and performing treatments and interventions, ordering and review of laboratory studies, ordering and review of radiographic studies, pulse oximetry and re-evaluation of patient's condition.    Sharyn Creamer, MD 05/14/21 [phone]

## 2021-05-14 NOTE — Anesthesia Preprocedure Evaluation (Signed)
Anesthesia Evaluation  Patient identified by MRN, date of birth, ID band Patient awake    Reviewed: Allergy & Precautions, NPO status , Patient's Chart, lab work & pertinent test results  History of Anesthesia Complications Negative for: history of anesthetic complications  Airway Mallampati: II  TM Distance: >3 FB Neck ROM: Full    Dental no notable dental hx. (+) Teeth Intact   Pulmonary sleep apnea and Continuous Positive Airway Pressure Ventilation , neg COPD, Current SmokerPatient did not abstain from smoking.,     + decreased breath sounds      Cardiovascular Exercise Tolerance: Good METS(-) hypertension(-) CAD and (-) Past MI negative cardio ROS  (-) dysrhythmias  Rhythm:Regular Rate:Normal - Systolic murmurs    Neuro/Psych negative neurological ROS  negative psych ROS   GI/Hepatic GERD  Controlled,(+)     (-) substance abuse  ,   Endo/Other  neg diabetesMorbid obesity  Renal/GU negative Renal ROS     Musculoskeletal   Abdominal (+) + obese,   Peds  Hematology   Anesthesia Other Findings Past Medical History: No date: Sleep apnea  Reproductive/Obstetrics                             Anesthesia Physical Anesthesia Plan  ASA: 3 and emergent  Anesthesia Plan: General   Post-op Pain Management:    Induction: Intravenous and Rapid sequence  PONV Risk Score and Plan: 3 and Ondansetron, Dexamethasone and Midazolam  Airway Management Planned: Oral ETT and Video Laryngoscope Planned  Additional Equipment: None  Intra-op Plan:   Post-operative Plan: Extubation in OR  Informed Consent: I have reviewed the patients History and Physical, chart, labs and discussed the procedure including the risks, benefits and alternatives for the proposed anesthesia with the patient or authorized representative who has indicated his/her understanding and acceptance.     Dental advisory  given  Plan Discussed with: CRNA and Surgeon  Anesthesia Plan Comments: (Discussed risks of anesthesia with patient, including PONV, sore throat, lip/dental damage. Rare risks discussed as well, such as cardiorespiratory and neurological sequelae. Patient informed about increased incidence of above perioperative risk due to high BMI. Patient understands. )        Anesthesia Quick Evaluation

## 2021-05-14 NOTE — Anesthesia Postprocedure Evaluation (Signed)
Anesthesia Post Note  Patient: Jorge Allen  Procedure(s) Performed: INCISION AND DRAINAGE ABSCESS (Right)  Patient location during evaluation: PACU Anesthesia Type: General Level of consciousness: awake and alert Pain management: pain level controlled Vital Signs Assessment: post-procedure vital signs reviewed and stable Respiratory status: spontaneous breathing, nonlabored ventilation, respiratory function stable and patient connected to nasal cannula oxygen Cardiovascular status: blood pressure returned to baseline and stable Postop Assessment: no apparent nausea or vomiting Anesthetic complications: no   No notable events documented.   Last Vitals:  Vitals:   05/14/21 1909 05/14/21 2115  BP: 139/71 (!) 172/106  Pulse: 90 95  Resp: 18 13  Temp:    SpO2: 97% 92%    Last Pain:  Vitals:   05/14/21 1909  TempSrc:   PainSc: 5                  Corinda Gubler

## 2021-05-14 NOTE — Consult Note (Signed)
PHARMACY -  BRIEF ANTIBIOTIC NOTE   Pharmacy has received consult(s) for vancomycin and Zosyn from an ED provider.  The patient's profile has been reviewed for ht/wt/allergies/indication/available labs.    One time order(s) placed for zosyn 3.375 g and vancomycin 2500 mg (ordered as 1000 mg followed by 1500 mg)   Further antibiotics/pharmacy consults should be ordered by admitting physician if indicated.                       Thank you, Derrek Gu, PharmD 05/14/2021  6:17 PM

## 2021-05-14 NOTE — Transfer of Care (Signed)
Immediate Anesthesia Transfer of Care Note  Patient: Jorge Allen  Procedure(s) Performed: INCISION AND DRAINAGE ABSCESS (Right)  Patient Location: PACU  Anesthesia Type:General  Level of Consciousness: pateint uncooperative and confused  Airway & Oxygen Therapy: Patient Spontanous Breathing and Patient connected to face mask oxygen  Post-op Assessment: Report given to RN and Post -op Vital signs reviewed and stable  Post vital signs: Reviewed and stable  Last Vitals:  Vitals Value Taken Time  BP 165/105 05/14/21 2110  Temp    Pulse 94 05/14/21 2112  Resp    SpO2 98 % 05/14/21 2112  Vitals shown include unvalidated device data.  Last Pain:  Vitals:   05/14/21 1909  TempSrc:   PainSc: 5          Complications: No notable events documented.

## 2021-05-14 NOTE — Op Note (Signed)
Preoperative diagnosis:  Right scrotal wall abscess  Postoperative diagnosis:  Same  Procedure: Incision and drainage right scrotal wall abscess  Surgeon: Riki Altes, MD  Anesthesia: General  Complications: None  Intraoperative findings:  3-4 cm fluctuant area lateral aspect right hemiscrotum; moderate, thick pus obtained on incision Abscess cavity without necrotic tissue  EBL: Minimal  Specimens: Culture swabs scrotal abscess  Indication: Jorge Allen is a 40 y.o. patient with a right scrotal abscess who presents for I&D.  After reviewing the management options for treatment, he elected to proceed with the above surgical procedure(s). We have discussed the potential benefits and risks of the procedure, side effects of the proposed treatment, the likelihood of the patient achieving the goals of the procedure, and any potential problems that might occur during the procedure or recuperation. Informed consent has been obtained.  Description of procedure:  The patient was taken to the operating room and general anesthesia was induced.  The patient was placed in the dorsal lithotomy position, prepped and draped in the usual sterile fashion.  Vancomycin and Zosyn have been administered in the ED. A preoperative time-out was performed.   Just lateral to the right hemiscrotum the fluctuant area was palpated.  There was induration just lateral and a transverse incision was made through the area of fluctuance with a moderate amount of thick pus obtained.  Culture swabs were obtained as above.  A finger was inserted into the abscess cavity and there were no loculations.  Moderate bleeding from the skin edges were noted which were controlled with cautery.  The abscess cavity was copiously irrigated with saline and 1/4 inch iodoform packing was placed.  A dressing of ABDs, fluffs and scrotal support was applied.  After anesthetic reversal the patient was transported to the PACU in  stable condition.   Riki Altes, M.D.

## 2021-05-14 NOTE — Consult Note (Signed)
Urology Consult  Requesting physician: Dr. 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 male presented to the ED this afternoon complaining of right hemiscrotal pain and swelling.  4 days ago noted a knot in the lower portion of his right hemiscrotum that was mildly tender He noted progressive swelling and increasing pain over the last few days Denies fever, chills, nausea or vomiting No bothersome LUTS No similar problems in the past or prior history urologic problems No history diabetes Temp in the ED was 99 WBC 20.2, lactic acid pending Scrotal sonogram initially performed which showed normal-appearing testes bilaterally; lateral to the right hemiscrotum was an area of complex echogenicity which was felt concerning for a large right inguinal hernia CT abdomen pelvis with contrast was subsequently performed which showed a low-attenuation lesion of the right hemiscrotum measuring 3.3 x 2.3 x 2.5 cm with peripheral enhancement.  Extensive stranding was noted in scrotum extending to the base of the penis and perineum.  No gas was noted Bilateral pelvic adenopathy was identified and felt to be reactive   Past Medical History:  Diagnosis Date   Sleep apnea     Past Surgical History:  Procedure Laterality Date   FOOT SURGERY     TONSILLECTOMY      Home Medications:  No outpatient medications have been marked as taking for the 05/14/21 encounter [facility] Encounter).    Allergies: No Known Allergies  History reviewed. No pertinent family history.  Social History:  reports that he has been smoking cigarettes. He has never used smokeless tobacco. He reports current alcohol use of about 10.0 standard drinks of alcohol per week. He reports that he does not use drugs.  ROS: 14 point review of systems is negative except as per the HPI  Physical Exam:  Vital signs in last 24  hours: Temp:  [99 F (37.2 C)] 99 F (37.2 C) (07/26 1334) Pulse Rate:  [90-104] 90 (07/26 1909) Resp:  [18-20] 18 (07/26 1909) BP: (123-156)/(69-91) 139/71 (07/26 1909) SpO2:  [95 %-97 %] 97 % (07/26 1909) Weight:  [142.9 kg] 142.9 kg (07/26 1334) Constitutional:  Alert and oriented, No acute distress HEENT: [place] AT, moist mucus membranes.  Trachea midline, no masses Cardiovascular: Regular rate and rhythm Respiratory: Normal respiratory effort, lungs clear bilaterally GI: Abdomen is soft, nontender, nondistended, no abdominal masses GU: Phallus without lesions.  Testes descended bilaterally without masses or tenderness.  3-4 cm fluctuant mass in infero--lateral right hemiscrotum with surrounding induration and moderate tenderness.  No necrosis.  The remainder of the scrotum without erythema or skin thickening.  No crepitus palpated Skin: As above Neurologic: Grossly intact, no focal deficits, moving all 4 extremities Psychiatric: Normal mood and affect   Laboratory Data:  Recent Labs    05/14/21 1342  WBC 20.2*  HGB 14.0  HCT 41.3   Recent Labs    05/14/21 1342  NA 136  K 3.9  CL 100  CO2 28  GLUCOSE 119*  BUN 16  CREATININE 0.82  CALCIUM 8.9   No results for input(s): LABPT, INR in the last 72 hours. No results for input(s): LABURIN in the last 72 hours. Results for orders placed or performed during the hospital encounter of 05/14/21  Resp Panel by RT-PCR (Flu A&B, Covid) Nasopharyngeal Swab     Status: None   Collection Time: 05/14/21  6:11 PM   Specimen: Nasopharyngeal Swab; Nasopharyngeal(NP)  swabs in vial transport medium  Result Value Ref Range Status   SARS Coronavirus 2 by RT PCR NEGATIVE NEGATIVE Final    Comment: (NOTE) SARS-CoV-2 target nucleic acids are NOT DETECTED.  The SARS-CoV-2 RNA is generally detectable in upper respiratory specimens during the acute phase of infection. The lowest concentration of SARS-CoV-2 viral copies this assay can detect  is 138 copies/mL. A negative result does not preclude SARS-Cov-2 infection and should not be used as the sole basis for treatment or other patient management decisions. A negative result may occur with  improper specimen collection/handling, submission of specimen other than nasopharyngeal swab, presence of viral mutation(s) within the areas targeted by this assay, and inadequate number of viral copies(<138 copies/mL). A negative result must be combined with clinical observations, patient history, and epidemiological information. The expected result is Negative.  Fact Sheet for Patients:  BloggerCourse.com  Fact Sheet for Healthcare Providers:  SeriousBroker.it  This test is no t yet approved or cleared by the [place] FDA and  has been authorized for detection and/or diagnosis of SARS-CoV-2 by FDA under an Emergency Use Authorization (EUA). This EUA will remain  in effect (meaning this test can be used) for the duration of the COVID-19 declaration under Section 564(b)(1) of the Act, 21 U.S.C.section 360bbb-3(b)(1), unless the authorization is terminated  or revoked sooner.       Influenza A by PCR NEGATIVE NEGATIVE Final   Influenza B by PCR NEGATIVE NEGATIVE Final    Comment: (NOTE) The Xpert Xpress SARS-CoV-2/FLU/RSV plus assay is intended as an aid in the diagnosis of influenza from Nasopharyngeal swab specimens and should not be used as a sole basis for treatment. Nasal washings and aspirates are unacceptable for Xpert Xpress SARS-CoV-2/FLU/RSV testing.  Fact Sheet for Patients: BloggerCourse.com  Fact Sheet for Healthcare Providers: SeriousBroker.it  This test is not yet approved or cleared by the [place] FDA and has been authorized for detection and/or diagnosis of SARS-CoV-2 by FDA under an Emergency Use Authorization (EUA). This EUA will remain in effect  (meaning this test can be used) for the duration of the COVID-19 declaration under Section 564(b)(1) of the Act, 21 U.S.C. section 360bbb-3(b)(1), unless the authorization is terminated or revoked.  Performed at [facility], [address] [place], [place] [address]      Radiologic Imaging: CT and ultrasound images were personally reviewed and interpreted  CT ABDOMEN PELVIS W CONTRAST  Result Date: 05/14/2021 CLINICAL DATA:  RIGHT lower quadrant abdominal pain, RIGHT-sided scrotal edema and pain radiating into the RIGHT abdomen. EXAM: CT ABDOMEN AND PELVIS WITH CONTRAST TECHNIQUE: Multidetector CT imaging of the abdomen and pelvis was performed using the standard protocol following bolus administration of intravenous contrast. CONTRAST:  OMNIPAQUE IOHEXOL 300 MG/ML  SOLN COMPARISON:  Scrotal sonogram of the same date. FINDINGS: Lower chest: Lung bases are clear. No effusion. No consolidative changes. Hepatobiliary: Lobular hepatic contours. Mild hepatomegaly. 19 cm greatest craniocaudal dimension. Background hepatic steatosis. No focal lesion. No pericholecystic stranding. No biliary duct dilation. Portal vein is patent. Pancreas: Normal, without mass, inflammation or ductal dilatation. Spleen: Spleen normal size and contour. Adrenals/Urinary Tract: Adrenal glands are normal. Symmetric renal enhancement. No hydronephrosis. Smooth contour of the urinary bladder. Stomach/Bowel: No acute gastrointestinal process. Appendix is normal. Vascular/Lymphatic: Abdominal aorta with normal caliber. Smooth contour the IVC. There is no gastrohepatic or hepatoduodenal ligament lymphadenopathy. No retroperitoneal or mesenteric lymphadenopathy. Prominent bilateral pelvic lymph nodes up to 16 mm short axis dimension. Mild enlargement  of pelvic sidewall lymph nodes but less than a cm short axis. Scattered external iliac lymph nodes Reproductive: Prostate unremarkable. Stranding extending throughout the  scrotum worse on the RIGHT. Stranding extending from the scrotum to the base of the penis and into the inguinal crease, slightly and greater on the RIGHT than the LEFT along the inferior inguinal crease also along the perineum. No gas in the soft tissues. The most inferior aspect of the scrotum is excluded from view. This is only a small area of the scrotum. Heterogeneous area with peripheral enhancement and central low attenuation in the RIGHT hemiscrotum measuring 3.3 x 2.3 cm axial dimension and 2.5 cm greatest craniocaudal dimension. Diffuse scrotal edema and stranding in the scrotum elsewhere. Other: No ascites. Musculoskeletal: No acute musculoskeletal process. Spinal degenerative changes. Bilateral flank edema, mild. IMPRESSION: 1. Scrotal abscess versus complex phlegmon in the RIGHT hemiscrotum with extensive stranding about the scrotum and perineum extending to the base of the penis. No gas in the soft tissues. 2. Given constellation of findings early aggressive soft tissue infection or Fournier's gangrene is considered. Correlate with any recent rapid worsening clinical symptoms and suggest urologic consultation for further evaluation. No gas currently noted in soft tissues. 3. Prominent bilateral pelvic lymph nodes are likely reactive. 4. Hepatic steatosis with lobular hepatic contours. These results were called by telephone at the time of interpretation on 05/14/2021 at 5:41 pm to provider Minna Antis, MD, who verbally acknowledged these results. Electronically Signed   By: Donzetta Kohut M.D.   On: 05/14/2021 17:42   US SCROTUM W/DOPPLER  Result Date: 05/14/2021 CLINICAL DATA:  Acute scrotal pain and swelling. EXAM: SCROTAL ULTRASOUND DOPPLER ULTRASOUND OF THE TESTICLES TECHNIQUE: Complete ultrasound examination of the testicles, epididymis, and other scrotal structures was performed. Color and spectral Doppler ultrasound were also utilized to evaluate blood flow to the testicles. COMPARISON:   None. FINDINGS: Right testicle Measurements: 4.7 x 2.6 x 2.3 cm. No mass or microlithiasis visualized. Left testicle Measurements: 4.5 x 2.9 x 2.3 cm. No mass or microlithiasis visualized. Right epididymis:  5 mm epididymal cyst is noted. Left epididymis:  Normal in size and appearance. Hydrocele:  None visualized. Varicocele:  Small left-sided varicocele is noted. Pulsed Doppler interrogation of both testes demonstrates normal low resistance arterial and venous waveforms bilaterally. Complex abnormality is seen lateral to the right scrotal region which corresponds to area of tenderness. This is concerning for large right inguinal hernia which contains loop of bowel. IMPRESSION: No evidence of testicular mass or torsion. Complex abnormality is seen lateral to the right scrotal region which corresponds to area of palpable tenderness. This is concerning for large right inguinal hernia which contains loop of bowel. CT scan is recommended for further evaluation. Small left-sided varicocele. Electronically Signed   By: Lupita Raider M.D.   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

## 2021-05-14 NOTE — ED Notes (Signed)
Patient taken to CT scan.

## 2021-05-14 NOTE — ED Triage Notes (Signed)
Patient reports swollen scrotum that started yesterday. Reports hardness noted inside scrotum and raw on outside. Reports pain shoots up to his stomach. Reports this all started about three days ago. Denies trouble urinating

## 2021-05-14 NOTE — Sepsis Progress Note (Signed)
ELINK tracking the Code Sepsis. 

## 2021-05-14 NOTE — Sepsis Progress Note (Signed)
Notified bedside nurse of need to draw lactic acid and blood cultures.  

## 2021-05-14 NOTE — ED Notes (Signed)
Patient has taken off his jewelry and given it to his significant other. Patient has disrobed and is in a hospital gown. Report given to [facility] in the OR.

## 2021-05-14 NOTE — Anesthesia Procedure Notes (Addendum)
Procedure Name: Intubation Date/Time: 05/14/2021 8:03 PM Performed by: Waldo Laine, CRNA Pre-anesthesia Checklist: Patient identified, Patient being monitored, Timeout performed, Emergency Drugs available and Suction available Patient Re-evaluated:Patient Re-evaluated prior to induction Oxygen Delivery Method: Circle system utilized Preoxygenation: Pre-oxygenation with 100% oxygen Induction Type: IV induction and Rapid sequence Laryngoscope Size: 3 and McGraph Grade View: Grade I Tube type: Oral Tube size: 7.0 mm Number of attempts: 1 Airway Equipment and Method: Stylet Placement Confirmation: ETT inserted through vocal cords under direct vision, positive ETCO2 and breath sounds checked- equal and bilateral Secured at: 21 cm Tube secured with: Tape Dental Injury: Teeth and Oropharynx as per pre-operative assessment

## 2021-05-14 NOTE — Consult Note (Signed)
CODE SEPSIS - PHARMACY COMMUNICATION  **Broad Spectrum Antibiotics should be administered within 1 hour of Sepsis diagnosis**  Time Code Sepsis Called/Page Received: 1800  Antibiotics Ordered: 1819  Time of 1st antibiotic administration: 1914  Additional action taken by pharmacy: N/A  If necessary, Name of Provider/Nurse Contacted: N/A    Derrek Gu ,PharmD Clinical Pharmacist  05/14/2021  6:17 PM

## 2021-05-14 NOTE — H&P (Signed)
History and Physical    Jorge Allen WUJ:[id number] DOB: [birth year] DOA: 05/14/2021  PCP: Patient, No Pcp Per (Inactive)    Patient coming from:  Home    Chief Complaint:  Scrotal pain and swelling.   HPI: Jorge NYLEN is a 40 y.o. male seen in ed with complaints of scrotal swelling that started past few days with tenderness and irritation.  Patient reports that the pain goes up towards to his abdomen.  Upon arrival patient met sepsis criteria and code sepsis called stat patient taken to imaging stat.  Patient also received empiric antibiotics.  Due to high suspicion of Fournier's gangrene EDMD consulted urologist Dr. Bernardo Heater as an urgent consult with recommendations for inpatient admission with empiric antibiotics imaging and anticipated operative procedure tomorrow in the morning.  Pt has past medical history of OSA, GERD.  ED Course:  Vitals:   05/14/21 1334 05/14/21 1530  BP: (!) 156/91 123/69  Pulse: (!) 104 93  Resp: 20 19  Temp: 99 F (37.2 C)   TempSrc: Oral   SpO2: 96% 95%  Weight: (!) 142.9 kg   Height: $Remove'5\' 5"'oGWSWHy$  (1.651 m)   In the emergency room patient is tachycardic, low-grade fever of 99, O2 sats within normal limits, blood pressure 156/91 secondary to pain, CT imaging shows a scrotal abscess versus a phlegmon with extensive stranding in the perineum, concern for soft tissue infection or foreign years gangrene.  No gas currently noted in soft tissue, prominent pelvic lymph nodes secondary to infection.  CMP within normal limits, CBC shows a white count of 20.2 hemoglobin of 14 platelets of 253, respiratory panel collected.  In the emergency room patient received half a liter of LR, Percocet for pain, vancomycin and Zosyn.  Blood cultures were collected.    Review of Systems:  Review of Systems  Genitourinary:  Negative for dysuria.       Rt scrotal swelling and abscess.   All other systems reviewed and are negative.   Past Medical History:  Diagnosis Date    Sleep apnea     Past Surgical History:  Procedure Laterality Date   FOOT SURGERY     TONSILLECTOMY       reports that he has been smoking cigarettes. He has never used smokeless tobacco. He reports current alcohol use of about 10.0 standard drinks of alcohol per week. He reports that he does not use drugs.  No Known Allergies  History reviewed. No pertinent family history.  Prior to Admission medications   Medication Sig Start Date End Date Taking? Authorizing Provider  azithromycin (ZITHROMAX) 250 MG tablet Take 2 tabs today, then take 1 tab daily until gone. 04/05/21   Montine Circle, PA-C  benzonatate (TESSALON) 100 MG capsule Take 1 capsule (100 mg total) by mouth 2 (two) times daily as needed for cough. 04/05/21   Montine Circle, PA-C  hydrocortisone (ANUSOL-HC) 25 MG suppository Place 1 suppository (25 mg total) rectally 2 (two) times daily. 05/21/20   Laban Emperor, PA-C  lidocaine-prilocaine (EMLA) cream Apply 1 application topically as needed. 05/21/20   Laban Emperor, PA-C  Naproxen (NAPROSYN PO) Take by mouth.    [provider]  naproxen (NAPROSYN) 500 MG tablet Take 1 tablet (500 mg total) by mouth 2 (two) times daily. 03/30/21   Charlesetta Shanks, MD  pramoxine (PROCTOFOAM) 1 % foam Place 1 application rectally 3 (three) times daily as needed for anal itching. 05/21/20   Laban Emperor, PA-C  SUMAtriptan (IMITREX) 50 MG tablet Take  1 tablet (50 mg total) by mouth every 2 (two) hours as needed for migraine (no more than 2 pills in 24/h). 02/16/20   Star Age, MD  verapamil (CALAN) 40 MG tablet 1 pill in AM for one week, then twice daily thereafter. 02/16/20   Star Age, MD    Physical Exam: Vitals:   05/14/21 1334 05/14/21 1530  BP: (!) 156/91 123/69  Pulse: (!) 104 93  Resp: 20 19  Temp: 99 F (37.2 C)   TempSrc: Oral   SpO2: 96% 95%  Weight: (!) 142.9 kg   Height: $Remove'5\' 5"'caYeqTT$  (1.651 m)    Physical Exam Vitals reviewed.  Constitutional:      General: He  is not in acute distress.    Appearance: He is obese. He is not ill-appearing.  HENT:     Head: Normocephalic and atraumatic.     Right Ear: External ear normal.     Left Ear: External ear normal.     Nose: Nose normal.     Mouth/Throat:     Mouth: Mucous membranes are moist.  Eyes:     Extraocular Movements: Extraocular movements intact.     Pupils: Pupils are equal, round, and reactive to light.  Neck:     Vascular: No carotid bruit.  Cardiovascular:     Rate and Rhythm: Normal rate and regular rhythm.     Pulses: Normal pulses.     Heart sounds: Normal heart sounds.  Pulmonary:     Effort: Pulmonary effort is normal.     Breath sounds: Normal breath sounds.  Abdominal:     General: Bowel sounds are normal. There is no distension.     Palpations: Abdomen is soft. There is no mass.     Tenderness: There is no abdominal tenderness. There is no guarding.     Hernia: No hernia is present.  Musculoskeletal:     Right lower leg: No edema.     Left lower leg: No edema.  Skin:    General: Skin is warm.  Neurological:     General: No focal deficit present.     Mental Status: He is alert and oriented to person, place, and time.  Psychiatric:        Mood and Affect: Mood normal.        Behavior: Behavior normal.     Labs on Admission: I have personally reviewed following labs and imaging studies  No results for input(s): CKTOTAL, CKMB, TROPONINI in the last 72 hours. Lab Results  Component Value Date   WBC 20.2 (H) 05/14/2021   HGB 14.0 05/14/2021   HCT 41.3 05/14/2021   MCV 83.8 05/14/2021   PLT 253 05/14/2021    Recent Labs  Lab 05/14/21 1342  NA 136  K 3.9  CL 100  CO2 28  BUN 16  CREATININE 0.82  CALCIUM 8.9  PROT 7.2  BILITOT 0.7  ALKPHOS 95  ALT 20  AST 17  GLUCOSE 119*   Lab Results  Component Value Date   CHOL 156 12/17/2007   HDL 23.7 (L) 12/17/2007   LDLCALC 100 (H) 12/17/2007   TRIG 164 (H) 12/17/2007   Lab Results  Component Value Date    DDIMER <0.27 10/25/2012      COVID-19 Labs No results for input(s): DDIMER, FERRITIN, LDH, CRP in the last 72 hours. Lab Results  Component Value Date   [place] NEGATIVE 03/30/2021   [place] NEGATIVE 11/22/2020    Radiological Exams on Admission: CT ABDOMEN PELVIS  W CONTRAST  Result Date: 05/14/2021 CLINICAL DATA:  RIGHT lower quadrant abdominal pain, RIGHT-sided scrotal edema and pain radiating into the RIGHT abdomen. EXAM: CT ABDOMEN AND PELVIS WITH CONTRAST TECHNIQUE: Multidetector CT imaging of the abdomen and pelvis was performed using the standard protocol following bolus administration of intravenous contrast. CONTRAST:  158m OMNIPAQUE IOHEXOL 300 MG/ML  SOLN COMPARISON:  Scrotal sonogram of the same date. FINDINGS: Lower chest: Lung bases are clear. No effusion. No consolidative changes. Hepatobiliary: Lobular hepatic contours. Mild hepatomegaly. 19 cm greatest craniocaudal dimension. Background hepatic steatosis. No focal lesion. No pericholecystic stranding. No biliary duct dilation. Portal vein is patent. Pancreas: Normal, without mass, inflammation or ductal dilatation. Spleen: Spleen normal size and contour. Adrenals/Urinary Tract: Adrenal glands are normal. Symmetric renal enhancement. No hydronephrosis. Smooth contour of the urinary bladder. Stomach/Bowel: No acute gastrointestinal process. Appendix is normal. Vascular/Lymphatic: Abdominal aorta with normal caliber. Smooth contour the IVC. There is no gastrohepatic or hepatoduodenal ligament lymphadenopathy. No retroperitoneal or mesenteric lymphadenopathy. Prominent bilateral pelvic lymph nodes up to 16 mm short axis dimension. Mild enlargement of pelvic sidewall lymph nodes but less than a cm short axis. Scattered external iliac lymph nodes Reproductive: Prostate unremarkable. Stranding extending throughout the scrotum worse on the RIGHT. Stranding extending from the scrotum to the base of the penis and into the inguinal  crease, slightly and greater on the RIGHT than the LEFT along the inferior inguinal crease also along the perineum. No gas in the soft tissues. The most inferior aspect of the scrotum is excluded from view. This is only a small area of the scrotum. Heterogeneous area with peripheral enhancement and central low attenuation in the RIGHT hemiscrotum measuring 3.3 x 2.3 cm axial dimension and 2.5 cm greatest craniocaudal dimension. Diffuse scrotal edema and stranding in the scrotum elsewhere. Other: No ascites. Musculoskeletal: No acute musculoskeletal process. Spinal degenerative changes. Bilateral flank edema, mild. IMPRESSION: 1. Scrotal abscess versus complex phlegmon in the RIGHT hemiscrotum with extensive stranding about the scrotum and perineum extending to the base of the penis. No gas in the soft tissues. 2. Given constellation of findings early aggressive soft tissue infection or Fournier's gangrene is considered. Correlate with any recent rapid worsening clinical symptoms and suggest urologic consultation for further evaluation. No gas currently noted in soft tissues. 3. Prominent bilateral pelvic lymph nodes are likely reactive. 4. Hepatic steatosis with lobular hepatic contours. These results were called by telephone at the time of interpretation on 05/14/2021 at 5:41 pm to provider KHarvest Dark MD, who verbally acknowledged these results. Electronically Signed   By: GZetta BillsM.D.   On: 05/14/2021 17:42   [place] W/DOPPLER  Result Date: 05/14/2021 CLINICAL DATA:  Acute scrotal pain and swelling. EXAM: SCROTAL ULTRASOUND DOPPLER ULTRASOUND OF THE TESTICLES TECHNIQUE: Complete ultrasound examination of the testicles, epididymis, and other scrotal structures was performed. Color and spectral Doppler ultrasound were also utilized to evaluate blood flow to the testicles. COMPARISON:  None. FINDINGS: Right testicle Measurements: 4.7 x 2.6 x 2.3 cm. No mass or microlithiasis visualized. Left  testicle Measurements: 4.5 x 2.9 x 2.3 cm. No mass or microlithiasis visualized. Right epididymis:  5 mm epididymal cyst is noted. Left epididymis:  Normal in size and appearance. Hydrocele:  None visualized. Varicocele:  Small left-sided varicocele is noted. Pulsed Doppler interrogation of both testes demonstrates normal low resistance arterial and venous waveforms bilaterally. Complex abnormality is seen lateral to the right scrotal region which corresponds to area of tenderness. This is concerning for  large right inguinal hernia which contains loop of bowel. IMPRESSION: No evidence of testicular mass or torsion. Complex abnormality is seen lateral to the right scrotal region which corresponds to area of palpable tenderness. This is concerning for large right inguinal hernia which contains loop of bowel. CT scan is recommended for further evaluation. Small left-sided varicocele. Electronically Signed   By: Marijo Conception M.D.   On: 05/14/2021 15:50    EKG: Independently reviewed.  None  Echocardiogram: None    Assessment/Plan Principal Problem:   Scrotal pain Active Problems:   Sepsis (HCC)   TOBACCO USE   GERD Scrotal pain: -Attribute to cellulitis/abscess/suspected Fournier's gangrene. -Patient being wheeled to the OR as soon as a exam and and interview patient briefly. -Patient started on Zosyn and vancomycin and will continue and follow culture and sensitivity. -I suspect patient is a diabetic and will obtain A1c and treat. -We greatly appreciate neurology consult management and care.  Sepsis: -Code sepsis activated in the emergency room. -Supportive care with antipyretics, pain meds. -Continue with IV antibiotic therapy, follow culture and sensitivity.  Tobacco abuse: -Counseled patient briefly that tobacco is bad for his health.  GERD: -IV PPI therapy.   Obesity: -Suspect type 2 diabetes mellitus, will obtain A1c and follow.    DVT prophylaxis:  Heparin   Code Status:   Full code   Family Communication:  Gagner,Zaria (Significant other)  [phone] (Mobile)   Disposition Plan:  home   Consults called:  Urology dr.Stoiff.   Admission status: Inpatient.     Para Skeans MD [facility] [phone] How to contact the [facility] Attending or Consulting provider [place] or covering provider during after hours [place], for this patient.    Check the care team in [facility] and look for a) attending/consulting [facility] provider listed and b) the [facility] team listed Log into [email or web address] and use [place]'s universal password to access. If you do not have the password, please contact the hospital operator. Locate the [facility] provider you are looking for under [facility] and page to a number that you can be directly reached. If you still have difficulty reaching the provider, please page the [facility] (Director on Call) for the Hospitalists listed on amion for assistance. [email or web address] Password [facility] 05/14/2021, 6:37 PM

## 2021-05-15 ENCOUNTER — Encounter: Payer: Self-pay | Admitting: Urology

## 2021-05-15 ENCOUNTER — Encounter: Payer: Self-pay | Admitting: Physician Assistant

## 2021-05-15 DIAGNOSIS — N492 Inflammatory disorders of scrotum: Secondary | ICD-10-CM

## 2021-05-15 DIAGNOSIS — N5082 Scrotal pain: Secondary | ICD-10-CM | POA: Diagnosis not present

## 2021-05-15 LAB — URINALYSIS, COMPLETE (UACMP) WITH MICROSCOPIC
Bacteria, UA: NONE SEEN
Bilirubin Urine: NEGATIVE
Glucose, UA: NEGATIVE mg/dL
Hgb urine dipstick: NEGATIVE
Ketones, ur: NEGATIVE mg/dL
Leukocytes,Ua: NEGATIVE
Nitrite: NEGATIVE
Protein, ur: NEGATIVE mg/dL
Specific Gravity, Urine: 1.018 (ref 1.005–1.030)
pH: 6 (ref 5.0–8.0)

## 2021-05-15 LAB — COMPREHENSIVE METABOLIC PANEL
ALT: 21 U/L (ref 0–44)
AST: 18 U/L (ref 15–41)
Albumin: 3.7 g/dL (ref 3.5–5.0)
Alkaline Phosphatase: 82 U/L (ref 38–126)
Anion gap: 8 (ref 5–15)
BUN: 14 mg/dL (ref 6–20)
CO2: 27 mmol/L (ref 22–32)
Calcium: 9 mg/dL (ref 8.9–10.3)
Chloride: 101 mmol/L (ref 98–111)
Creatinine, Ser: 0.64 mg/dL (ref 0.61–1.24)
GFR, Estimated: >60 mL/min (ref >60)
Glucose, Bld: 166 mg/dL — ABNORMAL HIGH (ref 70–99)
Potassium: 4.1 mmol/L (ref 3.5–5.1)
Sodium: 136 mmol/L (ref 135–145)
Total Bilirubin: 0.8 mg/dL (ref 0.3–1.2)
Total Protein: 7.6 g/dL (ref 6.5–8.1)

## 2021-05-15 LAB — C-REACTIVE PROTEIN: CRP: 6.7 mg/dL — ABNORMAL HIGH (ref <1.0)

## 2021-05-15 LAB — HIV ANTIBODY (ROUTINE TESTING W REFLEX): HIV Screen 4th Generation wRfx: NONREACTIVE

## 2021-05-15 LAB — CBC
HCT: 41.9 % (ref 39.0–52.0)
Hemoglobin: 13.9 g/dL (ref 13.0–17.0)
MCH: 28 pg (ref 26.0–34.0)
MCHC: 33.2 g/dL (ref 30.0–36.0)
MCV: 84.3 fL (ref 80.0–100.0)
Platelets: 259 10*3/uL (ref 150–400)
RBC: 4.97 MIL/uL (ref 4.22–5.81)
RDW: 14.7 % (ref 11.5–15.5)
WBC: 17.5 10*3/uL — ABNORMAL HIGH (ref 4.0–10.5)
nRBC: 0 % (ref 0.0–0.2)

## 2021-05-15 LAB — HEMOGLOBIN A1C
Hgb A1c MFr Bld: 6 % — ABNORMAL HIGH (ref 4.8–5.6)
Mean Plasma Glucose: 126 mg/dL

## 2021-05-15 LAB — MAGNESIUM: Magnesium: 2.4 mg/dL (ref 1.7–2.4)

## 2021-05-15 MED ORDER — FLUTICASONE PROPIONATE 50 MCG/ACT NA SUSP
1.0000 | Freq: Every day | NASAL | Status: DC
  Administered 2021-05-15: 1 via NASAL
  Filled 2021-05-15: qty 16

## 2021-05-15 MED ORDER — SALINE SPRAY 0.65 % NA SOLN: 1.0000 | Freq: Every day | NASAL | Status: DC

## 2021-05-15 MED ORDER — VANCOMYCIN HCL 1500 MG/300ML IV SOLN
1500.0000 mg | Freq: Two times a day (BID) | INTRAVENOUS | Status: DC
  Administered 2021-05-15 – 2021-05-16 (×2): 1500 mg via INTRAVENOUS
  Filled 2021-05-15 (×4): qty 300

## 2021-05-15 MED ORDER — ENOXAPARIN SODIUM 80 MG/0.8ML IJ SOSY
0.5000 mg/kg | PREFILLED_SYRINGE | INTRAMUSCULAR | Status: DC
  Administered 2021-05-16: 72.5 mg via SUBCUTANEOUS
  Filled 2021-05-15: qty 0.72

## 2021-05-15 MED ORDER — VANCOMYCIN HCL 1500 MG/300ML IV SOLN
1500.0000 mg | Freq: Two times a day (BID) | INTRAVENOUS | Status: DC
  Filled 2021-05-15: qty 300

## 2021-05-15 NOTE — Plan of Care (Signed)

## 2021-05-15 NOTE — Progress Notes (Signed)
PROGRESS NOTE    Jorge Allen  SKA:[id number] DOB: [birth year] DOA: 05/14/2021 PCP: Patient, No Pcp Per (Inactive)  217A/217A-AA   Assessment & Plan:   Principal Problem:   Jorge Allen Active Problems:   TOBACCO USE   GERD   Sepsis ([place])   Jorge Allen is a 40 y.o. male seen in ed with complaints of Jorge swelling that started past few days with tenderness and irritation.  Patient reports that the Allen goes up towards to his abdomen.  Upon arrival patient met sepsis criteria and code sepsis called stat patient taken to imaging stat.  Patient also received empiric antibiotics.  Due to high suspicion of Fournier's gangrene EDMD consulted urologist Dr. Bernardo Heater as an urgent consult with recommendations for inpatient admission with empiric antibiotics imaging and anticipated operative I/D.   Jorge Allen 2/2 Right Jorge Abscess S/p surgical I/D on 7/26 --received I/D with urology Dr. Bernardo Heater --started on vanc/zosyn on presentation. Plan: --cont vanc/zosyn for now --f/u abscess cx (note: it was collected after first dose of abx) --dressing change and packing with iodoform gauze, per order -Outpatient follow-up in urology clinic in 2 weeks for wound recheck  Sepsis, POA --tachycardia, leukocytosis on presentation  --abx as above   Tobacco abuse: -Counseled patient briefly that tobacco is bad for his health.   OSA on CPAP at home --CPAP nightly   Morbid obesity, BMI 52 --suspect other comorbidities associated with obesity. --f/u A1c   DVT prophylaxis: Lovenox SQ Code Status: Full code  Family Communication:  Level of care: Med-Surg Dispo:   The patient is from: home Anticipated d/c is to: home Anticipated d/c date is: 2-3 days Patient currently is not medically ready to d/c due to: on IV abx, pending cx   Subjective and Interval History:  Had Allen with dressing change, otherwise, Allen was controlled.  Normal oral intake.     Objective: Vitals:    05/14/21 2301 05/15/21 0517 05/15/21 0736 05/15/21 1521  BP: 130/71 121/63 (!) 143/73 118/62  Pulse: 87 79 86 84  Resp:   20 20  Temp: 98.6 F (37 C) 97.9 F (36.6 C) (!) 97.3 F (36.3 C) 97.8 F (36.6 C)  TempSrc: Oral Oral Oral Oral  SpO2: 92% 94% 91% 99%  Weight:      Height:        Intake/Output Summary (Last 24 hours) at 05/15/2021 1918 Last data filed at 05/15/2021 1015 Gross per 24 hour  Intake 1050 ml  Output 35 ml  Net 1015 ml   Filed Weights   05/14/21 1334  Weight: (!) 142.9 kg    Examination:   Constitutional: NAD, AAOx3, standing tidying up his room HEENT: conjunctivae and lids normal, EOMI CV: No cyanosis.   RESP: normal respiratory effort, on RA Neuro: II - XII grossly intact.   Psych: Normal mood and affect.  Appropriate judgement and reason   Data Reviewed: I have personally reviewed following labs and imaging studies  CBC: Recent Labs  Lab 05/14/21 1342 05/15/21 0506  WBC 20.2* 17.5*  NEUTROABS 15.1*  --   HGB 14.0 13.9  HCT 41.3 41.9  MCV 83.8 84.3  PLT 253 203   Basic Metabolic Panel: Recent Labs  Lab 05/14/21 1342 05/15/21 0506  NA 136 136  K 3.9 4.1  CL 100 101  CO2 28 27  GLUCOSE 119* 166*  BUN 16 14  CREATININE 0.82 0.64  CALCIUM 8.9 9.0  MG  --  2.4   GFR: Estimated Creatinine  Clearance: 161.7 mL/min (by C-G formula based on SCr of 0.64 mg/dL). Liver Function Tests: Recent Labs  Lab 05/14/21 1342 05/15/21 0506  AST 17 18  ALT 20 21  ALKPHOS 95 82  BILITOT 0.7 0.8  PROT 7.2 7.6  ALBUMIN 3.7 3.7   No results for input(s): LIPASE, AMYLASE in the last 168 hours. No results for input(s): AMMONIA in the last 168 hours. Coagulation Profile: No results for input(s): INR, PROTIME in the last 168 hours. Cardiac Enzymes: No results for input(s): CKTOTAL, CKMB, CKMBINDEX, TROPONINI in the last 168 hours. BNP (last 3 results) No results for input(s): PROBNP in the last 8760 hours. HbA1C: No results for input(s): HGBA1C  in the last 72 hours. CBG: No results for input(s): GLUCAP in the last 168 hours. Lipid Profile: No results for input(s): CHOL, HDL, LDLCALC, TRIG, CHOLHDL, LDLDIRECT in the last 72 hours. Thyroid Function Tests: Recent Labs    05/14/21 1342  TSH 1.205  FREET4 1.33*   Anemia Panel: No results for input(s): VITAMINB12, FOLATE, FERRITIN, TIBC, IRON, RETICCTPCT in the last 72 hours. Sepsis Labs: Recent Labs  Lab 05/14/21 1853  LATICACIDVEN 0.6    Recent Results (from the past 240 hour(s))  Resp Panel by RT-PCR (Flu A&B, Covid) Nasopharyngeal Swab     Status: None   Collection Time: 05/14/21  6:11 PM   Specimen: Nasopharyngeal Swab; Nasopharyngeal(NP) swabs in vial transport medium  Result Value Ref Range Status   SARS Coronavirus 2 by RT PCR NEGATIVE NEGATIVE Final    Comment: (NOTE) SARS-CoV-2 target nucleic acids are NOT DETECTED.  The SARS-CoV-2 RNA is generally detectable in upper respiratory specimens during the acute phase of infection. The lowest concentration of SARS-CoV-2 viral copies this assay can detect is 138 copies/mL. A negative result does not preclude SARS-Cov-2 infection and should not be used as the sole basis for treatment or other patient management decisions. A negative result may occur with  improper specimen collection/handling, submission of specimen other than nasopharyngeal swab, presence of viral mutation(s) within the areas targeted by this assay, and inadequate number of viral copies(<138 copies/mL). A negative result must be combined with clinical observations, patient history, and epidemiological information. The expected result is Negative.  Fact Sheet for Patients:  EntrepreneurPulse.com.au  Fact Sheet for Healthcare Providers:  IncredibleEmployment.be  This test is no t yet approved or cleared by the [place] FDA and  has been authorized for detection and/or diagnosis of SARS-CoV-2 by FDA under  an Emergency Use Authorization (EUA). This EUA will remain  in effect (meaning this test can be used) for the duration of the COVID-19 declaration under Section 564(b)(1) of the Act, 21 U.S.C.section 360bbb-3(b)(1), unless the authorization is terminated  or revoked sooner.       Influenza A by PCR NEGATIVE NEGATIVE Final   Influenza B by PCR NEGATIVE NEGATIVE Final    Comment: (NOTE) The Xpert Xpress SARS-CoV-2/FLU/RSV plus assay is intended as an aid in the diagnosis of influenza from Nasopharyngeal swab specimens and should not be used as a sole basis for treatment. Nasal washings and aspirates are unacceptable for Xpert Xpress SARS-CoV-2/FLU/RSV testing.  Fact Sheet for Patients: EntrepreneurPulse.com.au  Fact Sheet for Healthcare Providers: IncredibleEmployment.be  This test is not yet approved or cleared by the [place] FDA and has been authorized for detection and/or diagnosis of SARS-CoV-2 by FDA under an Emergency Use Authorization (EUA). This EUA will remain in effect (meaning this test can be used) for the duration of the  COVID-19 declaration under Section 564(b)(1) of the Act, 21 U.S.C. section 360bbb-3(b)(1), unless the authorization is terminated or revoked.  Performed at [facility], [address] [place], [place] [address]   Culture, blood (Routine X 2) w Reflex to ID Panel     Status: None (Preliminary result)   Collection Time: 05/14/21  6:28 PM   Specimen: BLOOD  Result Value Ref Range Status   Specimen Description BLOOD BLOOD LEFT FOREARM  Final   Special Requests   Final    BOTTLES DRAWN AEROBIC AND ANAEROBIC Blood Culture results may not be optimal due to an inadequate volume of blood received in culture bottles   Culture   Final    NO GROWTH < 12 HOURS Performed at [facility], [address], [place], [place] [address]    Report Status PENDING  Incomplete  Culture, blood (Routine X 2)  w Reflex to ID Panel     Status: None (Preliminary result)   Collection Time: 05/14/21  6:35 PM   Specimen: BLOOD  Result Value Ref Range Status   Specimen Description BLOOD LEFT ANTECUBITAL  Final   Special Requests   Final    BOTTLES DRAWN AEROBIC AND ANAEROBIC Blood Culture results may not be optimal due to an inadequate volume of blood received in culture bottles   Culture   Final    NO GROWTH < 12 HOURS Performed at [facility], [address], [place], [place] [address]    Report Status PENDING  Incomplete  Aerobic/Anaerobic Culture w Gram Stain (surgical/deep wound)     Status: None (Preliminary result)   Collection Time: 05/14/21  8:18 PM   Specimen: PATH Other; Abscess  Result Value Ref Range Status   Specimen Description   Final    ABSCESS Performed at [facility], [address] [place], [place] [address]    Special Requests RIGHT Jorge ABSCESS  Final   Gram Stain   Final    ABUNDANT WBC PRESENT, PREDOMINANTLY PMN ABUNDANT GRAM POSITIVE COCCI FEW GRAM NEGATIVE RODS    Culture   Final    NO GROWTH < 12 HOURS Performed at [facility], [place] [address] [place], [place] [address]    Report Status PENDING  Incomplete      Radiology Studies: CT ABDOMEN PELVIS W CONTRAST  Result Date: 05/14/2021 CLINICAL DATA:  RIGHT lower quadrant abdominal Allen, RIGHT-sided Jorge edema and Allen radiating into the RIGHT abdomen. EXAM: CT ABDOMEN AND PELVIS WITH CONTRAST TECHNIQUE: Multidetector CT imaging of the abdomen and pelvis was performed using the standard protocol following bolus administration of intravenous contrast. CONTRAST:  168m OMNIPAQUE IOHEXOL 300 MG/ML  SOLN COMPARISON:  Jorge sonogram of the same date. FINDINGS: Lower chest: Lung bases are clear. No effusion. No consolidative changes. Hepatobiliary: Lobular hepatic contours. Mild hepatomegaly. 19 cm greatest craniocaudal dimension. Background hepatic steatosis. No focal lesion. No  pericholecystic stranding. No biliary duct dilation. Portal vein is patent. Pancreas: Normal, without mass, inflammation or ductal dilatation. Spleen: Spleen normal size and contour. Adrenals/Urinary Tract: Adrenal glands are normal. Symmetric renal enhancement. No hydronephrosis. Smooth contour of the urinary bladder. Stomach/Bowel: No acute gastrointestinal process. Appendix is normal. Vascular/Lymphatic: Abdominal aorta with normal caliber. Smooth contour the IVC. There is no gastrohepatic or hepatoduodenal ligament lymphadenopathy. No retroperitoneal or mesenteric lymphadenopathy. Prominent bilateral pelvic lymph nodes up to 16 mm short axis dimension. Mild enlargement of pelvic sidewall lymph nodes but less than a cm short axis. Scattered external iliac  lymph nodes Reproductive: Prostate unremarkable. Stranding extending throughout the scrotum worse on the RIGHT. Stranding extending from the scrotum to the base of the penis and into the inguinal crease, slightly and greater on the RIGHT than the LEFT along the inferior inguinal crease also along the perineum. No gas in the soft tissues. The most inferior aspect of the scrotum is excluded from view. This is only a small area of the scrotum. Heterogeneous area with peripheral enhancement and central low attenuation in the RIGHT hemiscrotum measuring 3.3 x 2.3 cm axial dimension and 2.5 cm greatest craniocaudal dimension. Diffuse Jorge edema and stranding in the scrotum elsewhere. Other: No ascites. Musculoskeletal: No acute musculoskeletal process. Spinal degenerative changes. Bilateral flank edema, mild. IMPRESSION: 1. Jorge abscess versus complex phlegmon in the RIGHT hemiscrotum with extensive stranding about the scrotum and perineum extending to the base of the penis. No gas in the soft tissues. 2. Given constellation of findings early aggressive soft tissue infection or Fournier's gangrene is considered. Correlate with any recent rapid worsening clinical  symptoms and suggest urologic consultation for further evaluation. No gas currently noted in soft tissues. 3. Prominent bilateral pelvic lymph nodes are likely reactive. 4. Hepatic steatosis with lobular hepatic contours. These results were called by telephone at the time of interpretation on 05/14/2021 at 5:41 pm to provider Harvest Dark, MD, who verbally acknowledged these results. Electronically Signed   By: Zetta Bills M.D.   On: 05/14/2021 17:42   US SCROTUM W/DOPPLER  Result Date: 05/14/2021 CLINICAL DATA:  Acute Jorge Allen and swelling. EXAM: Jorge ULTRASOUND DOPPLER ULTRASOUND OF THE TESTICLES TECHNIQUE: Complete ultrasound examination of the testicles, epididymis, and other Jorge structures was performed. Color and spectral Doppler ultrasound were also utilized to evaluate blood flow to the testicles. COMPARISON:  None. FINDINGS: Right testicle Measurements: 4.7 x 2.6 x 2.3 cm. No mass or microlithiasis visualized. Left testicle Measurements: 4.5 x 2.9 x 2.3 cm. No mass or microlithiasis visualized. Right epididymis:  5 mm epididymal cyst is noted. Left epididymis:  Normal in size and appearance. Hydrocele:  None visualized. Varicocele:  Small left-sided varicocele is noted. Pulsed Doppler interrogation of both testes demonstrates normal low resistance arterial and venous waveforms bilaterally. Complex abnormality is seen lateral to the right Jorge region which corresponds to area of tenderness. This is concerning for large right inguinal hernia which contains loop of bowel. IMPRESSION: No evidence of testicular mass or torsion. Complex abnormality is seen lateral to the right Jorge region which corresponds to area of palpable tenderness. This is concerning for large right inguinal hernia which contains loop of bowel. CT scan is recommended for further evaluation. Small left-sided varicocele. Electronically Signed   By: Marijo Conception M.D.   On: 05/14/2021 15:50     Scheduled  Meds:  heparin  5,000 Units Subcutaneous Q8H   Continuous Infusions:  piperacillin-tazobactam 3.375 g (05/15/21 1738)   vancomycin Stopped (05/15/21 1556)     LOS: 1 day     Enzo Bi, MD [facility] If 7PM-7AM, please contact night-coverage 05/15/2021, 7:18 PM

## 2021-05-15 NOTE — Progress Notes (Signed)
PHARMACIST - PHYSICIAN COMMUNICATION  CONCERNING:  Enoxaparin (Lovenox) for DVT Prophylaxis   DESCRIPTION: Patient was prescribed enoxaprin 40mg  q24 hours for VTE prophylaxis.   Filed Weights   05/14/21 1334  Weight: (!) 142.9 kg (315 lb)    Body mass index is 52.42 kg/m.  Estimated Creatinine Clearance: 161.7 mL/min (by C-G formula based on SCr of 0.64 mg/dL).   Based on [facility] policy patient is candidate for enoxaparin 0.5mg /kg TBW SQ every 24 hours based on BMI being >30.  RECOMMENDATION: Pharmacy has adjusted enoxaparin dose per [facility] policy.  Patient is now receiving enoxaparin 72.5 mg every 24 hours    [facility], PharmD Clinical Pharmacist  05/15/2021 7:56 PM

## 2021-05-15 NOTE — Progress Notes (Addendum)
Urology Inpatient Progress Note  Subjective: No acute events overnight.  He is afebrile, VSS. WBC count down today, 17.5.  Blood cultures pending with no growth at <12 hours.  Wound culture pending with abundant WBCs, abundant gram-positive cocci, and few gram-negative rods.  On antibiotics as below. Patient has reported dysuria and urinary incontinence today.  He feels he is emptying appropriately.  UA and culture ordered and pending, though suspect these will be rather bland given they are collected after starting antibiotics.  He was bladder scanned with PVR 32mL.  Anti-infectives: Anti-infectives (From admission, onward)    Start     Dose/Rate Route Frequency Ordered Stop   05/15/21 1400  vancomycin (VANCOREADY) IVPB 1500 mg/300 mL  Status:  Discontinued        1,500 mg 150 mL/hr over 120 Minutes Intravenous Every 12 hours 05/15/21 0417 05/15/21 0419   05/15/21 1400  vancomycin (VANCOREADY) IVPB 1500 mg/300 mL        1,500 mg 150 mL/hr over 120 Minutes Intravenous Every 12 hours 05/15/21 0419     05/15/21 0415  vancomycin (VANCOREADY) IVPB 1500 mg/300 mL  Status:  Discontinued        1,500 mg 150 mL/hr over 120 Minutes Intravenous Every 12 hours 05/15/21 0316 05/15/21 0417   05/15/21 0100  piperacillin-tazobactam (ZOSYN) IVPB 3.375 g        3.375 g 12.5 mL/hr over 240 Minutes Intravenous Every 8 hours 05/14/21 2006     05/14/21 1930  vancomycin (VANCOREADY) IVPB 1500 mg/300 mL  Status:  Discontinued       See Hyperspace for full Linked Orders Report.   1,500 mg 150 mL/hr over 120 Minutes Intravenous  Once 05/14/21 1819 05/15/21 0347   05/14/21 1830  vancomycin (VANCOCIN) IVPB 1000 mg/200 mL premix       See Hyperspace for full Linked Orders Report.   1,000 mg 200 mL/hr over 60 Minutes Intravenous  Once 05/14/21 1819 05/14/21 2032   05/14/21 1830  piperacillin-tazobactam (ZOSYN) IVPB 3.375 g        3.375 g 100 mL/hr over 30 Minutes Intravenous  Once 05/14/21 1819 05/14/21 1936        Current Facility-Administered Medications  Medication Dose Route Frequency Provider Last Rate Last Admin   heparin injection 5,000 Units  5,000 Units Subcutaneous Q8H Irena Cords V, MD   5,000 Units at 05/15/21 3833   ketorolac (TORADOL) 15 MG/ML injection 15 mg  15 mg Intravenous Q8H PRN Stoioff, Scott C, MD   15 mg at 05/15/21 0810   oxyCODONE (Oxy IR/ROXICODONE) immediate release tablet 5-10 mg  5-10 mg Oral Q4H PRN Stoioff, Scott C, MD       piperacillin-tazobactam (ZOSYN) IVPB 3.375 g  3.375 g Intravenous Q8H Irena Cords V, MD 12.5 mL/hr at 05/15/21 0812 3.375 g at 05/15/21 0812   vancomycin (VANCOREADY) IVPB 1500 mg/300 mL  1,500 mg Intravenous Q12H Shanlever, Charmayne Sheer, RPH       Objective: Vital signs in last 24 hours: Temp:  [97.3 F (36.3 C)-99 F (37.2 C)] 97.3 F (36.3 C) (07/27 0736) Pulse Rate:  [79-104] 86 (07/27 0736) Resp:  [12-20] 20 (07/27 0736) BP: (121-179)/(62-119) 143/73 (07/27 0736) SpO2:  [88 %-98 %] 91 % (07/27 0736) Weight:  [142.9 kg] 142.9 kg (07/26 1334)  Intake/Output from previous day: 07/26 0701 - 07/27 0700 In: 1050 [I.V.:1000; IV Piggyback:50] Out: -  Intake/Output this shift: No intake/output data recorded.  Physical Exam Vitals and nursing note reviewed.  Constitutional:  General: He is not in acute distress.    Appearance: He is not ill-appearing, toxic-appearing or diaphoretic.  HENT:     Head: Normocephalic and atraumatic.  Pulmonary:     Effort: Pulmonary effort is normal. No respiratory distress.  Genitourinary:    Comments: Induration surrounding his right lateral scrotal abscess, but no evidence of new or evolving purulence or fluctuance throughout the scrotum.  Bilateral descended testicles, nontender.  Iodoform gauze removed at the bedside today and wound bed inspected.  There is healthy appearing granulation tissue throughout the base. Skin:    General: Skin is warm and dry.  Neurological:     Mental Status: He is  alert and oriented to person, place, and time.  Psychiatric:        Mood and Affect: Mood normal.        Behavior: Behavior normal.   Lab Results:  Recent Labs    05/14/21 1342 05/15/21 0506  WBC 20.2* 17.5*  HGB 14.0 13.9  HCT 41.3 41.9  PLT 253 259   BMET Recent Labs    05/14/21 1342 05/15/21 0506  NA 136 136  K 3.9 4.1  CL 100 101  CO2 28 27  GLUCOSE 119* 166*  BUN 16 14  CREATININE 0.82 0.64  CALCIUM 8.9 9.0   Assessment & Plan: 40 year old male POD 1 from I&D with Dr. Lonna Cobb for management of a right scrotal wall abscess.  On empiric antibiotics with cultures pending.  Unclear if dysuria and urinary leakage is secondary to urinary tract infection versus IV fluids versus anesthesia, the patient does appear to be emptying well.  Empiric antibiotics for his wound would likely cover any urinary pathogens for UTI.  Michiel Cowboy and I changed the patient's dressing at the bedside today, replacing it with iodoform gauze.  We instructed the patient and his wife on how to perform these at home.  Patient benefited from premedication today and I counseled him that he may continue to premedicate in the coming days, though I suspect each subsequent dressing change will become less uncomfortable for him.  Patient's wife requests a note to excuse her from work since she was in the emergency department with him last night.  Note is in his chart under letters.  Recommendations: -Continue empiric antibiotics for total of 10 to 14 days of therapy -Daily dressing changes with iodoform gauze, including after discharge. Nursing may perform these while admitted starting tomorrow. -Follow UA and culture and treat as indicated -Outpatient follow-up in our clinic in 2 weeks for wound recheck  Carman Ching, PA-C 05/15/2021

## 2021-05-15 NOTE — Consult Note (Signed)
Pharmacy Antibiotic Note  Jorge Allen is a 40 y.o. male admitted on 05/14/2021 with sepsis/right scrotal wall abscess.    Pharmacy has been consulted for Vancomycin dosing.  Plan: Pt was to receive total loading dose of Vancomycin 2500mg , but only received 1000 mg prior to going to surgery - load still not complete.  Will now start Vancomcyin 1500mg  q12h, Goal AUC 400-550. Expected AUC: 466 SCr used: 0.82   Height: 5\' 5"  (165.1 cm) Weight: (!) 142.9 kg (315 lb) IBW/kg (Calculated) : 61.5  Temp (24hrs), Avg:98.5 F (36.9 C), Min:97.7 F (36.5 C), Max:99 F (37.2 C)  Recent Labs  Lab 05/14/21 1342 05/14/21 1853  WBC 20.2*  --   CREATININE 0.82  --   LATICACIDVEN  --  0.6    Estimated Creatinine Clearance: 157.8 mL/min (by C-G formula based on SCr of 0.82 mg/dL).    No Known Allergies  Antimicrobials this admission: Zosyn 7/26 >> Vancomcyin 7/26 >>  Dose adjustments this admission: N/A  Microbiology results: 7/26 BCx: pending 7/26 Wcx: pending  Thank you for allowing pharmacy to be a part of this patient's care.  8/26, PharmD, BCPS Clinical Pharmacist 05/15/2021 3:15 AM

## 2021-05-15 NOTE — Sepsis Progress Note (Signed)
ELINK Code Sepsis Completion Note:  Pt had BC drawn and ABX administered before going to surgery. Only had approx 2500 ml IVF resuscitation. LA was only 0.6. Admitted to med surgical floor for continued monitoring.   Jorge Jobin DNP [name] RN [phone] PM

## 2021-05-15 NOTE — TOC Initial Note (Signed)
Transition of Care [facility]) - Initial/Assessment Note    Patient Details  Name: Jorge Allen MRN: [id number] Date of Birth: 06-01-1981  Transition of Care [facility]) CM/SW Contact:    Hetty Ely, RN Phone Number: 05/15/2021, 10:54 AM  Clinical Narrative:  Spoke with patient who lives with wife in [place], independent with ADL's, no assisted devices, uses C-Pap at night could not remember service name, denies use of Oxygen. Uses [facility]. Denies use of HH services. Able to do shopping and cooking. PCP [facility], could not remember name only saw him once. Dr. Turner Daniels is the Orthopedic provider that write Rx's for foot problems. TOC to continue to track for discharge needs.               Expected Discharge Plan: Home/Self Care Barriers to Discharge: Continued Medical Work up   Patient Goals and CMS Choice Patient states their goals for this hospitalization and ongoing recovery are:: To return home with Wife.   Choice offered to / list presented to : NA  Expected Discharge Plan and Services Expected Discharge Plan: Home/Self Care       Living arrangements for the past 2 months: Single Family Home                                      Prior Living Arrangements/Services Living arrangements for the past 2 months: Single Family Home Lives with:: Spouse Patient language and need for interpreter reviewed:: Yes Do you feel safe going back to the place where you live?: Yes      Need for Family Participation in Patient Care: No (Comment) Care giver support system in place?: Yes (comment) Current home services: DME (C-Pap at night) Criminal Activity/Legal Involvement Pertinent to Current Situation/Hospitalization: No - Comment as needed  Activities of Daily Living Home Assistive Devices/Equipment: None ADL Screening (condition at time of admission) Patient's cognitive ability adequate to safely complete daily activities?: Yes Is the patient deaf or  have difficulty hearing?: No Does the patient have difficulty seeing, even when wearing glasses/contacts?: No Does the patient have difficulty concentrating, remembering, or making decisions?: No Patient able to express need for assistance with ADLs?: No Does the patient have difficulty dressing or bathing?: No Independently performs ADLs?: Yes (appropriate for developmental age) Does the patient have difficulty walking or climbing stairs?: No Weakness of Legs: None Weakness of Arms/Hands: None  Permission Sought/Granted                  Emotional Assessment Appearance:: Appears stated age Attitude/Demeanor/Rapport: Engaged Affect (typically observed): Accepting Orientation: : Oriented to Self, Oriented to Place, Oriented to  Time, Oriented to Situation Alcohol / Substance Use: Not Applicable Psych Involvement: No (comment)  Admission diagnosis:  Scrotal pain [N50.82] Scrotal swelling [N50.89] Scrotal abscess [N49.2] Sepsis without acute organ dysfunction, due to unspecified organism [facility]) [A41.9] Patient Active Problem List   Diagnosis Date Noted   Scrotal pain 05/14/2021   Sepsis (HCC) 05/14/2021   Sleep apnea 05/14/2021   NONSPEC ELEVATION OF LEVELS OF TRANSAMINASE/LDH 12/21/2007   TOBACCO USE 12/17/2007   GERD 12/17/2007   PCP:  Patient, No Pcp Per (Inactive) Pharmacy:   [id number] - [place], [place] - [address] [place] [address] Phone: [phone] Fax: [phone]  [id number] - [place], [place] - [address]  [address] [place] [address] Phone: [phone] Fax: [phone]  [id number] [place], [place] - [address] [place] [address] Phone: [phone] Fax: [phone]     Social Determinants of Health (SDOH) Interventions    Readmission Risk Interventions No flowsheet data  found.

## 2021-05-16 ENCOUNTER — Telehealth: Payer: Self-pay | Admitting: Physician Assistant

## 2021-05-16 DIAGNOSIS — N5082 Scrotal pain: Secondary | ICD-10-CM | POA: Diagnosis not present

## 2021-05-16 LAB — CBC
HCT: 39.2 % (ref 39.0–52.0)
Hemoglobin: 12.9 g/dL — ABNORMAL LOW (ref 13.0–17.0)
MCH: 28 pg (ref 26.0–34.0)
MCHC: 32.9 g/dL (ref 30.0–36.0)
MCV: 85.2 fL (ref 80.0–100.0)
Platelets: 236 10*3/uL (ref 150–400)
RBC: 4.6 MIL/uL (ref 4.22–5.81)
RDW: 15.3 % (ref 11.5–15.5)
WBC: 16.8 10*3/uL — ABNORMAL HIGH (ref 4.0–10.5)
nRBC: 0 % (ref 0.0–0.2)

## 2021-05-16 LAB — BASIC METABOLIC PANEL
Anion gap: 7 (ref 5–15)
BUN: 15 mg/dL (ref 6–20)
CO2: 25 mmol/L (ref 22–32)
Calcium: 8.5 mg/dL — ABNORMAL LOW (ref 8.9–10.3)
Chloride: 104 mmol/L (ref 98–111)
Creatinine, Ser: 0.68 mg/dL (ref 0.61–1.24)
GFR, Estimated: >60 mL/min (ref >60)
Glucose, Bld: 132 mg/dL — ABNORMAL HIGH (ref 70–99)
Potassium: 4 mmol/L (ref 3.5–5.1)
Sodium: 136 mmol/L (ref 135–145)

## 2021-05-16 LAB — C-REACTIVE PROTEIN: CRP: 2.9 mg/dL — ABNORMAL HIGH (ref <1.0)

## 2021-05-16 LAB — URINE CULTURE: Culture: NO GROWTH

## 2021-05-16 LAB — MAGNESIUM: Magnesium: 2.1 mg/dL (ref 1.7–2.4)

## 2021-05-16 MED ORDER — OXYCODONE HCL 5 MG PO TABS: 5.0000 mg | ORAL_TABLET | ORAL | 0 refills | Status: DC | PRN

## 2021-05-16 MED ORDER — SULFAMETHOXAZOLE-TRIMETHOPRIM 800-160 MG PO TABS: 1.0000 | ORAL_TABLET | Freq: Two times a day (BID) | ORAL | 0 refills | Status: AC

## 2021-05-16 MED ORDER — DIPHENHYDRAMINE HCL 25 MG PO CAPS
25.0000 mg | ORAL_CAPSULE | Freq: Four times a day (QID) | ORAL | Status: DC | PRN
  Administered 2021-05-16: 25 mg via ORAL
  Filled 2021-05-16: qty 1

## 2021-05-16 MED ORDER — PHENOL 1.4 % MT LIQD
1.0000 | OROMUCOSAL | Status: DC | PRN
  Filled 2021-05-16: qty 177

## 2021-05-16 NOTE — Telephone Encounter (Signed)
currently admitted, please schedule him for wound check with me in 2 weeks   APPT MADE

## 2021-05-16 NOTE — Plan of Care (Signed)
  Problem: Education: Goal: Knowledge of General Education information will improve Description: Including pain rating scale, medication(s)/side effects and non-pharmacologic comfort measures Outcome: Adequate for Discharge   

## 2021-05-16 NOTE — Discharge Summary (Signed)
Physician Discharge Summary  Jorge Allen:[id number] DOB: 07-18-1981 DOA: 05/14/2021  PCP: Patient, No Pcp Per (Inactive)  Admit date: 05/14/2021 Discharge date: 05/16/2021  Admitted From: Home Disposition: Home  Recommendations for Outpatient Follow-up:  Follow up with PCP in 1-2 weeks Follow-up with urology in 1 to 2 weeks Please obtain BMP/CBC in one week Please follow up on the following pending results: Final culture results  Home Health: No Equipment/Devices: None Discharge Condition: Stable CODE STATUS: Full Diet recommendation: Regular   Brief/Interim Summary: Jorge Allen is a 40 y.o. male seen in ed with complaints of scrotal swelling that started past few days with tenderness and irritation.  Patient reports that the pain goes up towards to his abdomen.  Upon arrival patient met sepsis criteria and code sepsis called stat patient taken to imaging stat.  Patient also received empiric antibiotics.  Due to high suspicion of Fournier's gangrene EDMD consulted urologist Jorge Allen as an urgent consult with recommendations for inpatient admission with empiric antibiotics, imaging with no gas in the soft tissue.  Concern of scrotal abscess.  Urology performed incision and drainage and recommending dressing change with packing on a daily basis. Patient received vancomycin and Zosyn while in the hospital.  Wound cultures negative on preliminary results, cultures were taken when patient was already on broad-spectrum antibiotics. Antibiotics were switched with Bactrim for 2 weeks according to the advice given by urology and he will follow-up with them as an outpatient.  Patient met sepsis criteria with tachycardia, leukocytosis and a scrotal abscess as source of infection.  Patient also has stage III obesity with BMI of 52.  Counseling was provided and he will need regular counseling by PCP for weight reduction.  A1c was done and it was 6 which makes him prediabetic.  Patient  also has an history of obstructive sleep apnea and will continue with nightly use CPAP.  Discharge Diagnoses:  Principal Problem:   Scrotal pain Active Problems:   TOBACCO USE   GERD   Sepsis [facility])   Discharge Instructions  Discharge Instructions     Diet - low sodium heart healthy   Complete by: As directed    Discharge instructions   Complete by: As directed    It was pleasure taking care of you. You are being given antibiotics for 2 weeks, please take it as directed with meals. Continue changing your dressing as directed. You are also being given pain medications to be used only for severe pain, you can use 1 pill before the dressing change if needed.  Please be mindful that it can make you dizzy and constipated.  Keep yourself well-hydrated and can use over-the-counter MiraLAX or Metamucil if needed for constipation.  Do not drive or operate machinery while taking this medicine. Follow-up with your surgeon in 1 to 2 weeks for further recommendations.   Discharge wound care:   Complete by: As directed    Daily dressing changes with iodoform gauze,   Increase activity slowly   Complete by: As directed       Allergies as of 05/16/2021   No Known Allergies      Medication List     TAKE these medications    benzonatate 100 MG capsule Commonly known as: TESSALON Take 1 capsule (100 mg total) by mouth 2 (two) times daily as needed for cough.   Mobic 15 MG tablet Generic drug: meloxicam Take 15 mg by mouth daily.   oxyCODONE 5 MG immediate release tablet Commonly known  as: Oxy IR/ROXICODONE Take 1-2 tablets (5-10 mg total) by mouth every 4 (four) hours as needed for moderate pain or severe pain.   phentermine 37.5 MG tablet Commonly known as: ADIPEX-P Take 37.5 mg by mouth every morning.   sulfamethoxazole-trimethoprim 800-160 MG tablet Commonly known as: BACTRIM DS Take 1 tablet by mouth 2 (two) times daily for 14 days.   SUMAtriptan 50 MG tablet Commonly  known as: Imitrex Take 1 tablet (50 mg total) by mouth every 2 (two) hours as needed for migraine (no more than 2 pills in 24/h).               Discharge Care Instructions  (From admission, onward)           Start     Ordered   05/16/21 0000  Discharge wound care:       Comments: Daily dressing changes with iodoform gauze,   05/16/21 1337            Follow-up Information     Stoioff, [name], MD. Schedule an appointment as soon as possible for a visit in 1 week(s).   Specialty: Urology Contact information: [place] [address] [phone]                No Known Allergies  Consultations: Urology  Procedures/Studies: CT ABDOMEN PELVIS W CONTRAST  Result Date: 05/14/2021 CLINICAL DATA:  RIGHT lower quadrant abdominal pain, RIGHT-sided scrotal edema and pain radiating into the RIGHT abdomen. EXAM: CT ABDOMEN AND PELVIS WITH CONTRAST TECHNIQUE: Multidetector CT imaging of the abdomen and pelvis was performed using the standard protocol following bolus administration of intravenous contrast. CONTRAST:  168m OMNIPAQUE IOHEXOL 300 MG/ML  SOLN COMPARISON:  Scrotal sonogram of the same date. FINDINGS: Lower chest: Lung bases are clear. No effusion. No consolidative changes. Hepatobiliary: Lobular hepatic contours. Mild hepatomegaly. 19 cm greatest craniocaudal dimension. Background hepatic steatosis. No focal lesion. No pericholecystic stranding. No biliary duct dilation. Portal vein is patent. Pancreas: Normal, without mass, inflammation or ductal dilatation. Spleen: Spleen normal size and contour. Adrenals/Urinary Tract: Adrenal glands are normal. Symmetric renal enhancement. No hydronephrosis. Smooth contour of the urinary bladder. Stomach/Bowel: No acute gastrointestinal process. Appendix is normal. Vascular/Lymphatic: Abdominal aorta with normal caliber. Smooth contour the IVC. There is no gastrohepatic or hepatoduodenal ligament  lymphadenopathy. No retroperitoneal or mesenteric lymphadenopathy. Prominent bilateral pelvic lymph nodes up to 16 mm short axis dimension. Mild enlargement of pelvic sidewall lymph nodes but less than a cm short axis. Scattered external iliac lymph nodes Reproductive: Prostate unremarkable. Stranding extending throughout the scrotum worse on the RIGHT. Stranding extending from the scrotum to the base of the penis and into the inguinal crease, slightly and greater on the RIGHT than the LEFT along the inferior inguinal crease also along the perineum. No gas in the soft tissues. The most inferior aspect of the scrotum is excluded from view. This is only a small area of the scrotum. Heterogeneous area with peripheral enhancement and central low attenuation in the RIGHT hemiscrotum measuring 3.3 x 2.3 cm axial dimension and 2.5 cm greatest craniocaudal dimension. Diffuse scrotal edema and stranding in the scrotum elsewhere. Other: No ascites. Musculoskeletal: No acute musculoskeletal process. Spinal degenerative changes. Bilateral flank edema, mild. IMPRESSION: 1. Scrotal abscess versus complex phlegmon in the RIGHT hemiscrotum with extensive stranding about the scrotum and perineum extending to the base of the penis. No gas in the soft tissues. 2. Given constellation of findings early aggressive soft  tissue infection or Fournier's gangrene is considered. Correlate with any recent rapid worsening clinical symptoms and suggest urologic consultation for further evaluation. No gas currently noted in soft tissues. 3. Prominent bilateral pelvic lymph nodes are likely reactive. 4. Hepatic steatosis with lobular hepatic contours. These results were called by telephone at the time of interpretation on 05/14/2021 at 5:41 pm to provider Harvest Dark, MD, who verbally acknowledged these results. Electronically Signed   By: Zetta Bills M.D.   On: 05/14/2021 17:42   US SCROTUM W/DOPPLER  Result Date: 05/14/2021 CLINICAL  DATA:  Acute scrotal pain and swelling. EXAM: SCROTAL ULTRASOUND DOPPLER ULTRASOUND OF THE TESTICLES TECHNIQUE: Complete ultrasound examination of the testicles, epididymis, and other scrotal structures was performed. Color and spectral Doppler ultrasound were also utilized to evaluate blood flow to the testicles. COMPARISON:  None. FINDINGS: Right testicle Measurements: 4.7 x 2.6 x 2.3 cm. No mass or microlithiasis visualized. Left testicle Measurements: 4.5 x 2.9 x 2.3 cm. No mass or microlithiasis visualized. Right epididymis:  5 mm epididymal cyst is noted. Left epididymis:  Normal in size and appearance. Hydrocele:  None visualized. Varicocele:  Small left-sided varicocele is noted. Pulsed Doppler interrogation of both testes demonstrates normal low resistance arterial and venous waveforms bilaterally. Complex abnormality is seen lateral to the right scrotal region which corresponds to area of tenderness. This is concerning for large right inguinal hernia which contains loop of bowel. IMPRESSION: No evidence of testicular mass or torsion. Complex abnormality is seen lateral to the right scrotal region which corresponds to area of palpable tenderness. This is concerning for large right inguinal hernia which contains loop of bowel. CT scan is recommended for further evaluation. Small left-sided varicocele. Electronically Signed   By: Marijo Conception M.D.   On: 05/14/2021 15:50    Subjective: Patient was seen and examined today.  Pain is improving.  Per patient and his wife is going to help him with the dressing changes.  Denies any new complaint.  Would like to go home.  Discharge Exam: Vitals:   05/16/21 0610 05/16/21 0839  BP: (!) 146/71 127/84  Pulse: 70 71  Resp:  18  Temp: 98.4 F (36.9 C) 97.7 F (36.5 C)  SpO2: 97% 95%   Vitals:   05/15/21 1521 05/15/21 1935 05/16/21 0610 05/16/21 0839  BP: 118/62 120/62 (!) 146/71 127/84  Pulse: 84 85 70 71  Resp: '20 16  18  ' Temp: 97.8 F (36.6 C)  98.2 F (36.8 C) 98.4 F (36.9 C) 97.7 F (36.5 C)  TempSrc: Oral Oral Oral Oral  SpO2: 99% 97% 97% 95%  Weight:      Height:        General: Pt is alert, awake, not in acute distress Cardiovascular: RRR, S1/S2 +, no rubs, no gallops Respiratory: CTA bilaterally, no wheezing, no rhonchi Abdominal: Soft, NT, ND, bowel sounds + Extremities: no edema, no cyanosis   The results of significant diagnostics from this hospitalization (including imaging, microbiology, ancillary and laboratory) are listed below for reference.    Microbiology: Recent Results (from the past 240 hour(s))  Resp Panel by RT-PCR (Flu A&B, Covid) Nasopharyngeal Swab     Status: None   Collection Time: 05/14/21  6:11 PM   Specimen: Nasopharyngeal Swab; Nasopharyngeal(NP) swabs in vial transport medium  Result Value Ref Range Status   SARS Coronavirus 2 by RT PCR NEGATIVE NEGATIVE Final    Comment: (NOTE) SARS-CoV-2 target nucleic acids are NOT DETECTED.  The SARS-CoV-2 RNA is generally  detectable in upper respiratory specimens during the acute phase of infection. The lowest concentration of SARS-CoV-2 viral copies this assay can detect is 138 copies/mL. A negative result does not preclude SARS-Cov-2 infection and should not be used as the sole basis for treatment or other patient management decisions. A negative result may occur with  improper specimen collection/handling, submission of specimen other than nasopharyngeal swab, presence of viral mutation(s) within the areas targeted by this assay, and inadequate number of viral copies(<138 copies/mL). A negative result must be combined with clinical observations, patient history, and epidemiological information. The expected result is Negative.  Fact Sheet for Patients:  EntrepreneurPulse.com.au  Fact Sheet for Healthcare Providers:  IncredibleEmployment.be  This test is no t yet approved or cleared by the [place]  FDA and  has been authorized for detection and/or diagnosis of SARS-CoV-2 by FDA under an Emergency Use Authorization (EUA). This EUA will remain  in effect (meaning this test can be used) for the duration of the COVID-19 declaration under Section 564(b)(1) of the Act, 21 U.S.C.section 360bbb-3(b)(1), unless the authorization is terminated  or revoked sooner.       Influenza A by PCR NEGATIVE NEGATIVE Final   Influenza B by PCR NEGATIVE NEGATIVE Final    Comment: (NOTE) The Xpert Xpress SARS-CoV-2/FLU/RSV plus assay is intended as an aid in the diagnosis of influenza from Nasopharyngeal swab specimens and should not be used as a sole basis for treatment. Nasal washings and aspirates are unacceptable for Xpert Xpress SARS-CoV-2/FLU/RSV testing.  Fact Sheet for Patients: EntrepreneurPulse.com.au  Fact Sheet for Healthcare Providers: IncredibleEmployment.be  This test is not yet approved or cleared by the [place] FDA and has been authorized for detection and/or diagnosis of SARS-CoV-2 by FDA under an Emergency Use Authorization (EUA). This EUA will remain in effect (meaning this test can be used) for the duration of the COVID-19 declaration under Section 564(b)(1) of the Act, 21 U.S.C. section 360bbb-3(b)(1), unless the authorization is terminated or revoked.  Performed at [facility], [address] [place], [place] [address]   Culture, blood (Routine X 2) w Reflex to ID Panel     Status: None (Preliminary result)   Collection Time: 05/14/21  6:28 PM   Specimen: BLOOD  Result Value Ref Range Status   Specimen Description BLOOD BLOOD LEFT FOREARM  Final   Special Requests   Final    BOTTLES DRAWN AEROBIC AND ANAEROBIC Blood Culture results may not be optimal due to an inadequate volume of blood received in culture bottles   Culture   Final    NO GROWTH 2 DAYS Performed at [facility], [address],  [place], [place] [address]    Report Status PENDING  Incomplete  Culture, blood (Routine X 2) w Reflex to ID Panel     Status: None (Preliminary result)   Collection Time: 05/14/21  6:35 PM   Specimen: BLOOD  Result Value Ref Range Status   Specimen Description BLOOD LEFT ANTECUBITAL  Final   Special Requests   Final    BOTTLES DRAWN AEROBIC AND ANAEROBIC Blood Culture results may not be optimal due to an inadequate volume of blood received in culture bottles   Culture   Final    NO GROWTH 2 DAYS Performed at [facility], [address] [place], [place] [address]    Report Status PENDING  Incomplete  Aerobic/Anaerobic Culture w Gram Stain (surgical/deep wound)     Status: None (Preliminary result)   Collection  Time: 05/14/21  8:18 PM   Specimen: PATH Other; Abscess  Result Value Ref Range Status   Specimen Description   Final    ABSCESS Performed at [facility], [address] [place], [place] [address]    Special Requests RIGHT SCROTAL ABSCESS  Final   Gram Stain   Final    ABUNDANT WBC PRESENT, PREDOMINANTLY PMN ABUNDANT GRAM POSITIVE COCCI FEW GRAM NEGATIVE RODS    Culture   Final    NO GROWTH 2 DAYS Performed at [facility], [place] [address] [place], [place] [address]    Report Status PENDING  Incomplete  Urine Culture     Status: None   Collection Time: 05/15/21 10:35 AM   Specimen: Urine, Random  Result Value Ref Range Status   Specimen Description   Final    URINE, RANDOM Performed at [facility], [address], [place], [place] [address]    Special Requests   Final    NONE Performed at [facility], [address], [place], [place] [address]    Culture   Final    NO GROWTH Performed at [facility], [address], [place],  [address]    Report Status 05/16/2021 FINAL  Final     Labs: BNP (last 3 results) No results for input(s): BNP in the last 8760 hours. Basic Metabolic Panel: Recent Labs   Lab 05/14/21 1342 05/15/21 0506 05/16/21 0452  NA 136 136 136  K 3.9 4.1 4.0  CL 100 101 104  CO2 '28 27 25  ' GLUCOSE 119* 166* 132*  BUN '16 14 15  ' CREATININE 0.82 0.64 0.68  CALCIUM 8.9 9.0 8.5*  MG  --  2.4 2.1   Liver Function Tests: Recent Labs  Lab 05/14/21 1342 05/15/21 0506  AST 17 18  ALT 20 21  ALKPHOS 95 82  BILITOT 0.7 0.8  PROT 7.2 7.6  ALBUMIN 3.7 3.7   No results for input(s): LIPASE, AMYLASE in the last 168 hours. No results for input(s): AMMONIA in the last 168 hours. CBC: Recent Labs  Lab 05/14/21 1342 05/15/21 0506 05/16/21 0452  WBC 20.2* 17.5* 16.8*  NEUTROABS 15.1*  --   --   HGB 14.0 13.9 12.9*  HCT 41.3 41.9 39.2  MCV 83.8 84.3 85.2  PLT 253 259 236   Cardiac Enzymes: No results for input(s): CKTOTAL, CKMB, CKMBINDEX, TROPONINI in the last 168 hours. BNP: Invalid input(s): POCBNP CBG: No results for input(s): GLUCAP in the last 168 hours. D-Dimer No results for input(s): DDIMER in the last 72 hours. Hgb A1c Recent Labs    05/14/21 1342  HGBA1C 6.0*   Lipid Profile No results for input(s): CHOL, HDL, LDLCALC, TRIG, CHOLHDL, LDLDIRECT in the last 72 hours. Thyroid function studies Recent Labs    05/14/21 1342  TSH 1.205   Anemia work up No results for input(s): VITAMINB12, FOLATE, FERRITIN, TIBC, IRON, RETICCTPCT in the last 72 hours. Urinalysis    Component Value Date/Time   COLORURINE YELLOW (A) 05/15/2021 1035   APPEARANCEUR HAZY (A) 05/15/2021 1035   LABSPEC 1.018 05/15/2021 1035   PHURINE 6.0 05/15/2021 1035   GLUCOSEU NEGATIVE 05/15/2021 1035   HGBUR NEGATIVE 05/15/2021 1035   HGBUR negative 12/17/2007 1359   BILIRUBINUR NEGATIVE 05/15/2021 1035   KETONESUR NEGATIVE 05/15/2021 1035   PROTEINUR NEGATIVE 05/15/2021 1035   UROBILINOGEN 0.2 12/17/2007 1359   NITRITE NEGATIVE 05/15/2021 1035   LEUKOCYTESUR NEGATIVE 05/15/2021 1035   Sepsis Labs  Invalid input(s): PROCALCITONIN,  WBC,   LACTICIDVEN Microbiology Recent Results (from the past 240 hour(s))  Resp Panel by RT-PCR (Flu A&B, Covid) Nasopharyngeal Swab     Status: None   Collection Time: 05/14/21  6:11 PM   Specimen: Nasopharyngeal Swab; Nasopharyngeal(NP) swabs in vial transport medium  Result Value Ref Range Status   SARS Coronavirus 2 by RT PCR NEGATIVE NEGATIVE Final    Comment: (NOTE) SARS-CoV-2 target nucleic acids are NOT DETECTED.  The SARS-CoV-2 RNA is generally detectable in upper respiratory specimens during the acute phase of infection. The lowest concentration of SARS-CoV-2 viral copies this assay can detect is 138 copies/mL. A negative result does not preclude SARS-Cov-2 infection and should not be used as the sole basis for treatment or other patient management decisions. A negative result may occur with  improper specimen collection/handling, submission of specimen other than nasopharyngeal swab, presence of viral mutation(s) within the areas targeted by this assay, and inadequate number of viral copies(<138 copies/mL). A negative result must be combined with clinical observations, patient history, and epidemiological information. The expected result is Negative.  Fact Sheet for Patients:  EntrepreneurPulse.com.au  Fact Sheet for Healthcare Providers:  IncredibleEmployment.be  This test is no t yet approved or cleared by the [place] FDA and  has been authorized for detection and/or diagnosis of SARS-CoV-2 by FDA under an Emergency Use Authorization (EUA). This EUA will remain  in effect (meaning this test can be used) for the duration of the COVID-19 declaration under Section 564(b)(1) of the Act, 21 U.S.C.section 360bbb-3(b)(1), unless the authorization is terminated  or revoked sooner.       Influenza A by PCR NEGATIVE NEGATIVE Final   Influenza B by PCR NEGATIVE NEGATIVE Final    Comment: (NOTE) The Xpert Xpress SARS-CoV-2/FLU/RSV plus  assay is intended as an aid in the diagnosis of influenza from Nasopharyngeal swab specimens and should not be used as a sole basis for treatment. Nasal washings and aspirates are unacceptable for Xpert Xpress SARS-CoV-2/FLU/RSV testing.  Fact Sheet for Patients: EntrepreneurPulse.com.au  Fact Sheet for Healthcare Providers: IncredibleEmployment.be  This test is not yet approved or cleared by the [place] FDA and has been authorized for detection and/or diagnosis of SARS-CoV-2 by FDA under an Emergency Use Authorization (EUA). This EUA will remain in effect (meaning this test can be used) for the duration of the COVID-19 declaration under Section 564(b)(1) of the Act, 21 U.S.C. section 360bbb-3(b)(1), unless the authorization is terminated or revoked.  Performed at [facility], [address] [place], [place] [address]   Culture, blood (Routine X 2) w Reflex to ID Panel     Status: None (Preliminary result)   Collection Time: 05/14/21  6:28 PM   Specimen: BLOOD  Result Value Ref Range Status   Specimen Description BLOOD BLOOD LEFT FOREARM  Final   Special Requests   Final    BOTTLES DRAWN AEROBIC AND ANAEROBIC Blood Culture results may not be optimal due to an inadequate volume of blood received in culture bottles   Culture   Final    NO GROWTH 2 DAYS Performed at [facility], [address] [place], [place] [address]    Report Status PENDING  Incomplete  Culture, blood (Routine X 2) w Reflex to ID Panel     Status: None (Preliminary result)   Collection Time: 05/14/21  6:35 PM   Specimen: BLOOD  Result Value Ref Range Status   Specimen Description BLOOD LEFT ANTECUBITAL  Final  Special Requests   Final    BOTTLES DRAWN AEROBIC AND ANAEROBIC Blood Culture results may not be optimal due to an inadequate volume of blood received in culture bottles   Culture   Final    NO GROWTH 2 DAYS Performed at [facility], [address], [place], [place] [address]    Report Status PENDING  Incomplete  Aerobic/Anaerobic Culture w Gram Stain (surgical/deep wound)     Status: None (Preliminary result)   Collection Time: 05/14/21  8:18 PM   Specimen: PATH Other; Abscess  Result Value Ref Range Status   Specimen Description   Final    ABSCESS Performed at [facility], [address], [place], [place] [address]    Special Requests RIGHT SCROTAL ABSCESS  Final   Gram Stain   Final    ABUNDANT WBC PRESENT, PREDOMINANTLY PMN ABUNDANT GRAM POSITIVE COCCI FEW GRAM NEGATIVE RODS    Culture   Final    NO GROWTH 2 DAYS Performed at [facility], [place] [address] [place], [place] [address]    Report Status PENDING  Incomplete  Urine Culture     Status: None   Collection Time: 05/15/21 10:35 AM   Specimen: Urine, Random  Result Value Ref Range Status   Specimen Description   Final    URINE, RANDOM Performed at [facility], [address] [place], [place] [address]    Special Requests   Final    NONE Performed at [facility], [address] [place], [place] [address]    Culture   Final    NO GROWTH Performed at [facility], [place] [address], [place], [place] [address]    Report Status 05/16/2021 FINAL  Final    Time coordinating discharge: Over 30 minutes  SIGNED:  Lorella Nimrod, MD  [facility] 05/16/2021, 1:39 PM  If 7PM-7AM, please contact night-coverage [email or web address]  This record has been created using Systems analyst. Errors have been sought and corrected,but may not always be located. Such creation errors do not reflect on the standard of care.

## 2021-05-20 LAB — AEROBIC/ANAEROBIC CULTURE W GRAM STAIN (SURGICAL/DEEP WOUND)

## 2021-05-21 LAB — CULTURE, BLOOD (ROUTINE X 2)
Culture: NO GROWTH
Culture: NO GROWTH

## 2021-05-30 ENCOUNTER — Encounter: Payer: Self-pay | Admitting: Physician Assistant

## 2021-05-30 ENCOUNTER — Ambulatory Visit: Payer: Self-pay | Admitting: Physician Assistant

## 2022-03-18 ENCOUNTER — Encounter: Payer: Self-pay | Admitting: Nurse Practitioner

## 2022-03-18 ENCOUNTER — Ambulatory Visit (INDEPENDENT_AMBULATORY_CARE_PROVIDER_SITE_OTHER): Payer: 59 | Admitting: Nurse Practitioner

## 2022-03-18 VITALS — BP 128/72 | HR 81 | Temp 98.3°F | Ht 63.0 in | Wt 292.0 lb

## 2022-03-18 DIAGNOSIS — M79671 Pain in right foot: Secondary | ICD-10-CM

## 2022-03-18 DIAGNOSIS — R053 Chronic cough: Secondary | ICD-10-CM

## 2022-03-18 DIAGNOSIS — G4733 Obstructive sleep apnea (adult) (pediatric): Secondary | ICD-10-CM | POA: Diagnosis not present

## 2022-03-18 DIAGNOSIS — R7303 Prediabetes: Secondary | ICD-10-CM

## 2022-03-18 DIAGNOSIS — K219 Gastro-esophageal reflux disease without esophagitis: Secondary | ICD-10-CM

## 2022-03-18 DIAGNOSIS — M79672 Pain in left foot: Secondary | ICD-10-CM

## 2022-03-18 DIAGNOSIS — L732 Hidradenitis suppurativa: Secondary | ICD-10-CM | POA: Diagnosis not present

## 2022-03-18 DIAGNOSIS — E66813 Obesity, class 3: Secondary | ICD-10-CM

## 2022-03-18 DIAGNOSIS — E559 Vitamin D deficiency, unspecified: Secondary | ICD-10-CM | POA: Diagnosis not present

## 2022-03-18 DIAGNOSIS — E7800 Pure hypercholesterolemia, unspecified: Secondary | ICD-10-CM

## 2022-03-18 DIAGNOSIS — L299 Pruritus, unspecified: Secondary | ICD-10-CM

## 2022-03-18 DIAGNOSIS — Z7689 Persons encountering health services in other specified circumstances: Secondary | ICD-10-CM

## 2022-03-18 DIAGNOSIS — Z6841 Body Mass Index (BMI) 40.0 and over, adult: Secondary | ICD-10-CM

## 2022-03-18 DIAGNOSIS — Z79899 Other long term (current) drug therapy: Secondary | ICD-10-CM

## 2022-03-18 MED ORDER — METFORMIN HCL 500 MG PO TABS: 500.0000 mg | ORAL_TABLET | Freq: Every day | ORAL | 1 refills | Status: DC

## 2022-03-18 MED ORDER — BENZONATATE 100 MG PO CAPS: 100.0000 mg | ORAL_CAPSULE | Freq: Four times a day (QID) | ORAL | 1 refills | Status: DC | PRN

## 2022-03-18 MED ORDER — HYDROXYZINE HCL 10 MG PO TABS: 10.0000 mg | ORAL_TABLET | Freq: Three times a day (TID) | ORAL | 0 refills | Status: DC | PRN

## 2022-03-18 MED ORDER — OMEPRAZOLE MAGNESIUM 20 MG PO TBEC: 20.0000 mg | DELAYED_RELEASE_TABLET | Freq: Every day | ORAL | 2 refills | Status: DC

## 2022-03-18 MED ORDER — MOBIC 15 MG PO TABS: 15.0000 mg | ORAL_TABLET | Freq: Every day | ORAL | 1 refills | Status: DC

## 2022-03-18 MED ORDER — WEGOVY 0.25 MG/0.5ML ~~LOC~~ SOAJ: 0.2500 mg | SUBCUTANEOUS | 0 refills | Status: DC

## 2022-03-18 NOTE — Progress Notes (Signed)
I,Tianna Badgett,acting as a Education administrator for [employer], FNP.,have documented all relevant documentation on the behalf of [name], FNP,as directed by  [name], FNP while in the presence of [name], [address].  This visit occurred during the SARS-CoV-2 public health emergency.  Safety protocols were in place, including screening questions prior to the visit, additional usage of staff PPE, and extensive cleaning of exam room while observing appropriate contact time as indicated for disinfecting solutions.  Subjective:     Patient ID: Jorge Allen , male    DOB: 03/26/1981 , 41 y.o.   MRN: [id number]   Chief Complaint  Patient presents with   Establish Care    HPI  Patient presents today to establish care. He was going to [place] on [employer] last seen last year. They no longer took his insurance.  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 Married. One child - daughter.   PMH - sleep apnea uses CPAP (would like to get a new machine and see a sleep provider). - 2016.  He was going [facility] he was at 336 lbs last June 2022.  He had been on phentermine. He has been Ozempic.  Reports using an inhaler but does not know the medications      Past Medical History:  Diagnosis Date   Sleep apnea      Family History  Problem Relation Age of Onset   Diabetes Mother    Cancer Mother    Dementia Mother    Hypertension Father    Cancer Father    Glaucoma Father    Dementia Maternal Grandmother    Cancer Maternal Grandfather    Hypertension Paternal Grandmother      Current Outpatient Medications:    benzonatate (TESSALON PERLES) 100 MG capsule, Take 1 capsule (100 mg total) by mouth every 6 (six) hours as needed., Disp: 30 capsule, Rfl: 1   buPROPion (WELLBUTRIN XL) 150 MG 24 hr tablet, Take 1 tablet by mouth every morning., Disp: , Rfl:    gabapentin (NEURONTIN) 300 MG capsule, Take by mouth., Disp: , Rfl:    hydrOXYzine (ATARAX) 10 MG tablet, Take 1 tablet  (10 mg total) by mouth 3 (three) times daily as needed., Disp: 30 tablet, Rfl: 0   omeprazole (PRILOSEC OTC) 20 MG tablet, Take 1 tablet (20 mg total) by mouth daily., Disp: 30 tablet, Rfl: 2   phentermine (ADIPEX-P) 37.5 MG tablet, Take 37.5 mg by mouth every morning., Disp: , Rfl:    Semaglutide-Weight Management (WEGOVY) 0.25 MG/0.5ML SOAJ, Inject 0.25 mg into the skin once a week., Disp: 2 mL, Rfl: 0   metFORMIN (GLUCOPHAGE) 500 MG tablet, Take 1 tablet (500 mg total) by mouth daily with breakfast., Disp: 90 tablet, Rfl: 1   MOBIC 15 MG tablet, Take 1 tablet (15 mg total) by mouth daily., Disp: 90 tablet, Rfl: 1   No Known Allergies   Review of Systems  Constitutional: Negative.   Respiratory:  Positive for cough (dry cough). Negative for wheezing.   Cardiovascular: Negative.   Gastrointestinal: Negative.   Musculoskeletal:        Bilateral foot pain  Skin:  Positive for rash.  Neurological: Negative.  Negative for dizziness.  Psychiatric/Behavioral: Negative.       Today's Vitals   03/18/22 1506  BP: 128/72  Pulse: 81  Temp: 98.3 F (36.8 C)  TempSrc: Oral  Weight: 292 lb (132.5 kg)  Height: _0  (1.6 m)   Body mass  index is 51.73 kg/m.  Wt Readings from Last 3 Encounters:  03/18/22 292 lb (132.5 kg)  05/14/21 (!) 315 lb (142.9 kg)  03/30/21 (!) 315 lb (142.9 kg)    Objective:  Physical Exam Vitals reviewed.  Constitutional:      Appearance: Normal appearance. He is obese.  Cardiovascular:     Rate and Rhythm: Normal rate and regular rhythm.     Pulses: Normal pulses.     Heart sounds: Normal heart sounds. No murmur heard. Pulmonary:     Effort: No respiratory distress.     Breath sounds: Normal breath sounds.  Musculoskeletal:        General: No swelling.  Skin:    General: Skin is warm and dry.     Capillary Refill: Capillary refill takes less than 2 seconds.  Neurological:     General: No focal deficit present.     Mental Status: He is alert and  oriented to person, place, and time.  Psychiatric:        Mood and Affect: Mood normal.        Behavior: Behavior normal.        Thought Content: Thought content normal.        Judgment: Judgment normal.         Assessment And Plan:     1. Hidradenitis suppurativa  2. Prediabetes - Hemoglobin A1c - metFORMIN (GLUCOPHAGE) 500 MG tablet; Take 1 tablet (500 mg total) by mouth daily with breakfast.  Dispense: 90 tablet; Refill: 1  3. Vitamin D deficiency  4. Obstructive sleep apnea syndrome Comments: He needs a new machine, will refer for a sleep study evaluation  - Ambulatory referral to Sleep Studies  5. Elevated cholesterol - Lipid panel  6. Pruritus Comments: Will check to see if low blood levels are the cause, no obvious rashes present - hydrOXYzine (ATARAX) 10 MG tablet; Take 1 tablet (10 mg total) by mouth 3 (three) times daily as needed.  Dispense: 30 tablet; Refill: 0 - Iron, TIBC and Ferritin Panel  7. Gastroesophageal reflux disease without esophagitis - omeprazole (PRILOSEC OTC) 20 MG tablet; Take 1 tablet (20 mg total) by mouth daily.  Dispense: 30 tablet; Refill: 2  8. Pain in both feet Comments: related to degenerative changes .  9. Chronic cough - benzonatate (TESSALON PERLES) 100 MG capsule; Take 1 capsule (100 mg total) by mouth every 6 (six) hours as needed.  Dispense: 30 capsule; Refill: 1  10. Class 3 severe obesity due to excess calories without serious comorbidity with body mass index (BMI) of 50.0 to 59.9 in adult [facility]) Chronic, he has been seen in the past at the Bariatric clinic Discussed healthy diet and regular exercise options  Encouraged to exercise at least 150 minutes per week with 2 days of strength training  He is encouraged to initially strive for BMI less than 30 to decrease cardiac risk. He is advised to exercise no less than 150 minutes per week.  Will start [facility] pending insurance approval she is to titrate weekly, discussed side  effects of nausea, abdominal pain or difficulty swallowing to notify office. [facility] teaching done  Return in 2 months for weight check. - Hemoglobin A1c - CMP14+EGFR - Semaglutide-Weight Management (WEGOVY) 0.25 MG/0.5ML SOAJ; Inject 0.25 mg into the skin once a week.  Dispense: 2 mL; Refill: 0  11. Other long term (current) drug therapy - CBC  12. Encounter to establish care    Patient was given opportunity to ask questions.  Patient verbalized understanding of the plan and was able to repeat key elements of the plan. All questions were answered to their satisfaction.  [name], FNP   I, [name], FNP, have reviewed all documentation for this visit. The documentation on 03/18/22 for the exam, diagnosis, procedures, and orders are all accurate and complete.   IF YOU HAVE BEEN REFERRED TO A SPECIALIST, IT MAY TAKE 1-2 WEEKS TO SCHEDULE/PROCESS THE REFERRAL. IF YOU HAVE NOT HEARD FROM US/SPECIALIST IN TWO WEEKS, PLEASE GIVE [place] A CALL AT [phone].   THE PATIENT IS ENCOURAGED TO PRACTICE SOCIAL DISTANCING DUE TO THE COVID-19 PANDEMIC.

## 2022-03-19 LAB — CBC
Hematocrit: 41.8 % (ref 37.5–51.0)
Hemoglobin: 13.7 g/dL (ref 13.0–17.7)
MCH: 27.8 pg (ref 26.6–33.0)
MCHC: 32.8 g/dL (ref 31.5–35.7)
MCV: 85 fL (ref 79–97)
Platelets: 236 10*3/uL (ref 150–450)
RBC: 4.92 x10E6/uL (ref 4.14–5.80)
RDW: 13.7 % (ref 11.6–15.4)
WBC: 11.1 10*3/uL — ABNORMAL HIGH (ref 3.4–10.8)

## 2022-03-19 LAB — CMP14+EGFR
ALT: 20 IU/L (ref 0–44)
AST: 16 IU/L (ref 0–40)
Albumin/Globulin Ratio: 1.5 (ref 1.2–2.2)
Albumin: 3.9 g/dL — ABNORMAL LOW (ref 4.0–5.0)
Alkaline Phosphatase: 97 IU/L (ref 44–121)
BUN/Creatinine Ratio: 21 — ABNORMAL HIGH (ref 9–20)
BUN: 16 mg/dL (ref 6–24)
Bilirubin Total: <0.2 mg/dL (ref 0.0–1.2)
CO2: 21 mmol/L (ref 20–29)
Calcium: 9.2 mg/dL (ref 8.7–10.2)
Chloride: 103 mmol/L (ref 96–106)
Creatinine, Ser: 0.77 mg/dL (ref 0.76–1.27)
Globulin, Total: 2.6 g/dL (ref 1.5–4.5)
Glucose: 93 mg/dL (ref 70–99)
Potassium: 4.5 mmol/L (ref 3.5–5.2)
Sodium: 137 mmol/L (ref 134–144)
Total Protein: 6.5 g/dL (ref 6.0–8.5)
eGFR: 115 mL/min/{1.73_m2} (ref >59)

## 2022-03-19 LAB — LIPID PANEL
Chol/HDL Ratio: 5 ratio (ref 0.0–5.0)
Cholesterol, Total: 140 mg/dL (ref 100–199)
HDL: 28 mg/dL — ABNORMAL LOW (ref >39)
LDL Chol Calc (NIH): 67 mg/dL (ref 0–99)
Triglycerides: 279 mg/dL — ABNORMAL HIGH (ref 0–149)
VLDL Cholesterol Cal: 45 mg/dL — ABNORMAL HIGH (ref 5–40)

## 2022-03-19 LAB — IRON,TIBC AND FERRITIN PANEL
Ferritin: 205 ng/mL (ref 30–400)
Iron Saturation: 22 % (ref 15–55)
Iron: 57 ug/dL (ref 38–169)
Total Iron Binding Capacity: 263 ug/dL (ref 250–450)
UIBC: 206 ug/dL (ref 111–343)

## 2022-03-19 LAB — HEMOGLOBIN A1C
Est. average glucose Bld gHb Est-mCnc: 117 mg/dL
Hgb A1c MFr Bld: 5.7 % — ABNORMAL HIGH (ref 4.8–5.6)

## 2022-04-08 ENCOUNTER — Other Ambulatory Visit: Payer: Self-pay | Admitting: Nurse Practitioner

## 2022-04-08 DIAGNOSIS — N492 Inflammatory disorders of scrotum: Secondary | ICD-10-CM

## 2022-04-16 ENCOUNTER — Ambulatory Visit: Payer: 59 | Admitting: Nurse Practitioner

## 2022-06-18 ENCOUNTER — Ambulatory Visit: Payer: 59 | Admitting: Nurse Practitioner

## 2022-07-16 ENCOUNTER — Ambulatory Visit (INDEPENDENT_AMBULATORY_CARE_PROVIDER_SITE_OTHER): Payer: 59 | Admitting: Podiatry

## 2022-07-16 DIAGNOSIS — L989 Disorder of the skin and subcutaneous tissue, unspecified: Secondary | ICD-10-CM

## 2022-07-16 NOTE — Progress Notes (Signed)
   Chief Complaint  Patient presents with   Callouses    Patient is here for callous removal.    Subjective: 41 y.o. male presenting to the office today as a new patient for evaluation of symptomatic calluses to the weightbearing surfaces of the bilateral feet that are very painful.  He has had them routinely debrided in the past.  He presents for further treatment and evaluation   Past Medical History:  Diagnosis Date   Sleep apnea     Past Surgical History:  Procedure Laterality Date   FOOT SURGERY     INCISION AND DRAINAGE ABSCESS Right 05/14/2021   Procedure: INCISION AND DRAINAGE ABSCESS;  Surgeon: Abbie Sons, MD;  Location: [facility] ORS;  Service: Urology;  Laterality: Right;   TONSILLECTOMY      No Known Allergies   Objective:  Physical Exam General: Alert and oriented x3 in no acute distress  Dermatology: Hyperkeratotic lesion(s) present on the surfaces of the bilateral feet. Pain on palpation with a central nucleated core noted. Skin is warm, dry and supple bilateral lower extremities. Negative for open lesions or macerations.  Vascular: Palpable pedal pulses bilaterally. No edema or erythema noted. Capillary refill within normal limits.  Neurological: Epicritic and protective threshold grossly intact bilaterally.   Musculoskeletal Exam: Pain on palpation at the keratotic lesion(s) noted. Range of motion within normal limits bilateral. Muscle strength 5/5 in all groups bilateral.  Assessment: 1.  Symptomatic callus; benign skin lesion   Plan of Care:  1. Patient evaluated 2. Excisional debridement of keratoic lesion(s) using a chisel blade was performed without incident.  3. Revitaderm lotion w/ 40% urea dispensed at checkout.  Apply daily 4. Patient is to return to the clinic PRN.   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 Edrick Kins, DPM    [address], [place] [address]                Office [phone]  Fax [phone]

## 2022-07-29 ENCOUNTER — Ambulatory Visit: Payer: Self-pay | Admitting: Podiatry

## 2022-08-05 ENCOUNTER — Ambulatory Visit: Payer: Self-pay | Admitting: Podiatry

## 2022-08-14 ENCOUNTER — Encounter: Payer: Self-pay | Admitting: Nurse Practitioner

## 2022-08-14 ENCOUNTER — Ambulatory Visit (INDEPENDENT_AMBULATORY_CARE_PROVIDER_SITE_OTHER): Payer: Commercial Managed Care - HMO | Admitting: Nurse Practitioner

## 2022-08-14 VITALS — BP 120/74 | HR 93 | Temp 98.7°F | Ht 63.0 in | Wt 288.0 lb

## 2022-08-14 DIAGNOSIS — Z6841 Body Mass Index (BMI) 40.0 and over, adult: Secondary | ICD-10-CM

## 2022-08-14 DIAGNOSIS — E7800 Pure hypercholesterolemia, unspecified: Secondary | ICD-10-CM | POA: Diagnosis not present

## 2022-08-14 DIAGNOSIS — M545 Low back pain, unspecified: Secondary | ICD-10-CM

## 2022-08-14 DIAGNOSIS — R7303 Prediabetes: Secondary | ICD-10-CM | POA: Diagnosis not present

## 2022-08-14 DIAGNOSIS — E559 Vitamin D deficiency, unspecified: Secondary | ICD-10-CM

## 2022-08-14 DIAGNOSIS — L299 Pruritus, unspecified: Secondary | ICD-10-CM

## 2022-08-14 DIAGNOSIS — D72828 Other elevated white blood cell count: Secondary | ICD-10-CM

## 2022-08-14 MED ORDER — MOBIC 15 MG PO TABS: 15.0000 mg | ORAL_TABLET | Freq: Every day | ORAL | 1 refills | Status: DC

## 2022-08-14 MED ORDER — CYCLOBENZAPRINE HCL 10 MG PO TABS: 10.0000 mg | ORAL_TABLET | Freq: Three times a day (TID) | ORAL | 0 refills | Status: DC | PRN

## 2022-08-14 MED ORDER — KETOROLAC TROMETHAMINE 60 MG/2ML IM SOLN
60.0000 mg | Freq: Once | INTRAMUSCULAR | Status: AC
  Administered 2022-08-14: 60 mg via INTRAMUSCULAR

## 2022-08-14 MED ORDER — TRIAMCINOLONE ACETONIDE 40 MG/ML IJ SUSP
40.0000 mg | Freq: Once | INTRAMUSCULAR | Status: AC
  Administered 2022-08-14: 40 mg via INTRAMUSCULAR

## 2022-08-14 MED ORDER — HYDROXYZINE HCL 10 MG PO TABS: 10.0000 mg | ORAL_TABLET | Freq: Three times a day (TID) | ORAL | 2 refills | Status: AC | PRN

## 2022-08-14 NOTE — Patient Instructions (Signed)
Acute Back Pain, Adult Acute back pain is sudden and usually short-lived. It is often caused by an injury to the muscles and tissues in the back. The injury may result from: A muscle, tendon, or ligament getting overstretched or torn. Ligaments are tissues that connect bones to each other. Lifting something improperly can cause a back strain. Wear and tear (degeneration) of the spinal disks. Spinal disks are circular tissue that provide cushioning between the bones of the spine (vertebrae). Twisting motions, such as while playing sports or doing yard work. A hit to the back. Arthritis. You may have a physical exam, lab tests, and imaging tests to find the cause of your pain. Acute back pain usually goes away with rest and home care. Follow these instructions at home: Managing pain, stiffness, and swelling Take over-the-counter and prescription medicines only as told by your health care provider. Treatment may include medicines for pain and inflammation that are taken by mouth or applied to the skin, or muscle relaxants. Your health care provider may recommend applying ice during the first 24-48 hours after your pain starts. To do this: Put ice in a plastic bag. Place a towel between your skin and the bag. Leave the ice on for 20 minutes, 2-3 times a day. Remove the ice if your skin turns bright red. This is very important. If you cannot feel pain, heat, or cold, you have a greater risk of damage to the area. If directed, apply heat to the affected area as often as told by your health care provider. Use the heat source that your health care provider recommends, such as a moist heat pack or a heating pad. Place a towel between your skin and the heat source. Leave the heat on for 20-30 minutes. Remove the heat if your skin turns bright red. This is especially important if you are unable to feel pain, heat, or cold. You have a greater risk of getting burned. Activity  Do not stay in bed. Staying in  bed for more than 1-2 days can delay your recovery. Sit up and stand up straight. Avoid leaning forward when you sit or hunching over when you stand. If you work at a desk, sit close to it so you do not need to lean over. Keep your chin tucked in. Keep your neck drawn back, and keep your elbows bent at a 90-degree angle (right angle). Sit high and close to the steering wheel when you drive. Add lower back (lumbar) support to your car seat, if needed. Take short walks on even surfaces as soon as you are able. Try to increase the length of time you walk each day. Do not sit, drive, or stand in one place for more than 30 minutes at a time. Sitting or standing for long periods of time can put stress on your back. Do not drive or use heavy machinery while taking prescription pain medicine. Use proper lifting techniques. When you bend and lift, use positions that put less stress on your back: Bend your knees. Keep the load close to your body. Avoid twisting. Exercise regularly as told by your health care provider. Exercising helps your back heal faster and helps prevent back injuries by keeping muscles strong and flexible. Work with a physical therapist to make a safe exercise program, as recommended by your health care provider. Do any exercises as told by your physical therapist. Lifestyle Maintain a healthy weight. Extra weight puts stress on your back and makes it difficult to have good   posture. Avoid activities or situations that make you feel anxious or stressed. Stress and anxiety increase muscle tension and can make back pain worse. Learn ways to manage anxiety and stress, such as through exercise. General instructions Sleep on a firm mattress in a comfortable position. Try lying on your side with your knees slightly bent. If you lie on your back, put a pillow under your knees. Keep your head and neck in a straight line with your spine (neutral position) when using electronic equipment like  smartphones or pads. To do this: Raise your smartphone or pad to look at it instead of bending your head or neck to look down. Put the smartphone or pad at the level of your face while looking at the screen. Follow your treatment plan as told by your health care provider. This may include: Cognitive or behavioral therapy. Acupuncture or massage therapy. Meditation or yoga. Contact a health care provider if: You have pain that is not relieved with rest or medicine. You have increasing pain going down into your legs or buttocks. Your pain does not improve after 2 weeks. You have pain at night. You lose weight without trying. You have a fever or chills. You develop nausea or vomiting. You develop abdominal pain. Get help right away if: You develop new bowel or bladder control problems. You have unusual weakness or numbness in your arms or legs. You feel faint. These symptoms may represent a serious problem that is an emergency. Do not wait to see if the symptoms will go away. Get medical help right away. Call your local emergency services (911 in the [place] Do not drive yourself to the hospital. Summary Acute back pain is sudden and usually short-lived. Use proper lifting techniques. When you bend and lift, use positions that put less stress on your back. Take over-the-counter and prescription medicines only as told by your health care provider, and apply heat or ice as told. This information is not intended to replace advice given to you by your health care provider. Make sure you discuss any questions you have with your health care provider. Document Revised: 12/28/2020 Document Reviewed: 12/28/2020 Elsevier Patient Education  2023 Elsevier Inc.  

## 2022-08-14 NOTE — Progress Notes (Signed)
I,Tianna Badgett,acting as a Education administrator for [employer], FNP.,have documented all relevant documentation on the behalf of [name], FNP,as directed by  [name], FNP while in the presence of [name], [place].  Subjective:     Patient ID: Jorge Allen , male    DOB: [birth year] , 41 y.o.   MRN: [id number]   Chief Complaint  Patient presents with   Back Pain    HPI  Patient presents today for treatment of back pain. He is having difficulty with standing up straight. Started this morning when he was putting his pants on. When he tries to reposition he will have a sharp pain and when sitting there is a squeezing.  He has taken meloxicam and a muscle relaxer that he does not know the name due to the medication being old. Pain is 6-7/10.  Denies numbness or tingling. He has a history of sciatic nerve pain. 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     Past Medical History:  Diagnosis Date   Sleep apnea      Family History  Problem Relation Age of Onset   Diabetes Mother    Cancer Mother    Dementia Mother    Hypertension Father    Cancer Father    Glaucoma Father    Dementia Maternal Grandmother    Cancer Maternal Grandfather    Hypertension Paternal Grandmother      Current Outpatient Medications:    benzonatate (TESSALON PERLES) 100 MG capsule, Take 1 capsule (100 mg total) by mouth every 6 (six) hours as needed., Disp: 30 capsule, Rfl: 1   cyclobenzaprine (FLEXERIL) 10 MG tablet, Take 1 tablet (10 mg total) by mouth 3 (three) times daily as needed for muscle spasms., Disp: 30 tablet, Rfl: 0   metFORMIN (GLUCOPHAGE) 500 MG tablet, Take 1 tablet (500 mg total) by mouth daily with breakfast., Disp: 90 tablet, Rfl: 1   omeprazole (PRILOSEC OTC) 20 MG tablet, Take 1 tablet (20 mg total) by mouth daily., Disp: 30 tablet, Rfl: 2   phentermine (ADIPEX-P) 37.5 MG tablet, Take 37.5 mg by mouth every morning., Disp: , Rfl:    hydrOXYzine (ATARAX) 10 MG tablet,  Take 1 tablet (10 mg total) by mouth 3 (three) times daily as needed., Disp: 30 tablet, Rfl: 2   lidocaine (LIDODERM) 5 %, Place 1 patch onto the skin daily. Remove & Discard patch within 12 hours or as directed by MD, Disp: 30 patch, Rfl: 0   MOBIC 15 MG tablet, Take 1 tablet (15 mg total) by mouth daily., Disp: 90 tablet, Rfl: 1   ondansetron (ZOFRAN-ODT) 4 MG disintegrating tablet, Take 1 tablet (4 mg total) by mouth every 6 (six) hours as needed for nausea or vomiting., Disp: 20 tablet, Rfl: 0   oxyCODONE-acetaminophen (PERCOCET) 5-325 MG tablet, Take 2 tablets by mouth every 6 (six) hours as needed for severe pain., Disp: 16 tablet, Rfl: 0   predniSONE (STERAPRED UNI-PAK 21 TAB) 10 MG (21) TBPK tablet, Take as directed, Disp: 21 tablet, Rfl: 0   Vitamin D, Ergocalciferol, (DRISDOL) 1.25 MG (50000 UNIT) CAPS capsule, Take 1 capsule (50,000 Units total) by mouth every 7 (seven) days., Disp: 12 capsule, Rfl: 1   No Known Allergies   Review of Systems  Constitutional: Negative.   Respiratory:  Negative for cough and wheezing.   Cardiovascular: Negative.   Gastrointestinal: Negative.   Musculoskeletal:  Positive for back pain (low back pain).  Bilateral foot pain  Skin:  Positive for rash.  Neurological: Negative.  Negative for dizziness.  Psychiatric/Behavioral: Negative.       Today's Vitals   08/14/22 1232  BP: 120/74  Pulse: 93  Temp: 98.7 F (37.1 C)  TempSrc: Oral  Weight: 288 lb (130.6 kg)  Height: _0  (1.6 m)  PainSc: 8    Body mass index is 51.02 kg/m.  Wt Readings from Last 3 Encounters:  08/14/22 288 lb (130.6 kg)  03/18/22 292 lb (132.5 kg)  05/14/21 (!) 315 lb (142.9 kg)    Objective:  Physical Exam Vitals reviewed.  Constitutional:      General: He is not in acute distress.    Appearance: Normal appearance. He is obese.  Cardiovascular:     Rate and Rhythm: Normal rate and regular rhythm.     Pulses: Normal pulses.     Heart sounds: Normal heart  sounds. No murmur heard. Pulmonary:     Effort: Pulmonary effort is normal. No respiratory distress.     Breath sounds: Normal breath sounds. No wheezing.  Musculoskeletal:        General: No swelling, tenderness, deformity or signs of injury. Normal range of motion.     Comments: He has difficulty with sitting up on the table  Skin:    General: Skin is warm.     Capillary Refill: Capillary refill takes less than 2 seconds.  Neurological:     General: No focal deficit present.     Mental Status: He is alert and oriented to person, place, and time.     Cranial Nerves: No cranial nerve deficit.     Motor: No weakness.         Assessment And Plan:     1. Class 3 severe obesity due to excess calories without serious comorbidity with body mass index (BMI) of 50.0 to 59.9 in adult [facility]) Chronic Discussed healthy diet and regular exercise options  Encouraged to exercise at least 150 minutes per week with 2 days of strength training  2. Elevated cholesterol Comments: No current medications, encouraged to eat a low fat diet and increase physical activity as tolerated - Lipid panel  3. Prediabetes - Hemoglobin A1c - BMP8+eGFR  4. Vitamin D deficiency Will check vitamin D level and supplement as needed.    Also encouraged to spend 15 minutes in the sun daily.  - VITAMIN D 25 Hydroxy (Vit-D Deficiency, Fractures)  5. Other elevated white blood cell (WBC) count Comments: Will recheck if continues to be elevated will consider treating for infection vs referral to Hematology - CBC  6. Acute bilateral low back pain without sciatica Comments: Will treat with Kenalog and Toradol - ketorolac (TORADOL) injection 60 mg - triamcinolone acetonide (KENALOG-40) injection 40 mg - cyclobenzaprine (FLEXERIL) 10 MG tablet; Take 1 tablet (10 mg total) by mouth 3 (three) times daily as needed for muscle spasms.  Dispense: 30 tablet; Refill: 0  7. Pruritus Comments: Will check to see if low blood  levels are the cause, no obvious rashes present - hydrOXYzine (ATARAX) 10 MG tablet; Take 1 tablet (10 mg total) by mouth 3 (three) times daily as needed.  Dispense: 30 tablet; Refill: 2     Patient was given opportunity to ask questions. Patient verbalized understanding of the plan and was able to repeat key elements of the plan. All questions were answered to their satisfaction.  [name], FNP   I, [name], FNP, have reviewed all documentation for  this visit. The documentation on 08/14/22 for the exam, diagnosis, procedures, and orders are all accurate and complete.   IF YOU HAVE BEEN REFERRED TO A SPECIALIST, IT MAY TAKE 1-2 WEEKS TO SCHEDULE/PROCESS THE REFERRAL. IF YOU HAVE NOT HEARD FROM US/SPECIALIST IN TWO WEEKS, PLEASE GIVE [place] A CALL AT [phone].   THE PATIENT IS ENCOURAGED TO PRACTICE SOCIAL DISTANCING DUE TO THE COVID-19 PANDEMIC.

## 2022-08-15 ENCOUNTER — Encounter: Payer: Self-pay | Admitting: Emergency Medicine

## 2022-08-15 ENCOUNTER — Emergency Department
Admission: EM | Admit: 2022-08-15 | Discharge: 2022-08-15 | Disposition: A | Discharge status: Home / Self Care | Payer: Commercial Managed Care - HMO | Attending: Emergency Medicine | Admitting: Emergency Medicine

## 2022-08-15 DIAGNOSIS — M545 Low back pain, unspecified: Secondary | ICD-10-CM | POA: Insufficient documentation

## 2022-08-15 DIAGNOSIS — E119 Type 2 diabetes mellitus without complications: Secondary | ICD-10-CM | POA: Diagnosis not present

## 2022-08-15 DIAGNOSIS — Z79899 Other long term (current) drug therapy: Secondary | ICD-10-CM | POA: Insufficient documentation

## 2022-08-15 DIAGNOSIS — Z7984 Long term (current) use of oral hypoglycemic drugs: Secondary | ICD-10-CM | POA: Diagnosis not present

## 2022-08-15 LAB — VITAMIN D 25 HYDROXY (VIT D DEFICIENCY, FRACTURES): Vit D, 25-Hydroxy: 15 ng/mL — ABNORMAL LOW (ref 30.0–100.0)

## 2022-08-15 LAB — CBC
Hematocrit: 41.8 % (ref 37.5–51.0)
Hemoglobin: 13.7 g/dL (ref 13.0–17.7)
MCH: 27.7 pg (ref 26.6–33.0)
MCHC: 32.8 g/dL (ref 31.5–35.7)
MCV: 84 fL (ref 79–97)
Platelets: 237 10*3/uL (ref 150–450)
RBC: 4.95 x10E6/uL (ref 4.14–5.80)
RDW: 13.4 % (ref 11.6–15.4)
WBC: 10.8 10*3/uL (ref 3.4–10.8)

## 2022-08-15 LAB — BMP8+EGFR
BUN/Creatinine Ratio: 14 (ref 9–20)
BUN: 15 mg/dL (ref 6–24)
CO2: 23 mmol/L (ref 20–29)
Calcium: 9.1 mg/dL (ref 8.7–10.2)
Chloride: 105 mmol/L (ref 96–106)
Creatinine, Ser: 1.06 mg/dL (ref 0.76–1.27)
Glucose: 95 mg/dL (ref 70–99)
Potassium: 4.4 mmol/L (ref 3.5–5.2)
Sodium: 141 mmol/L (ref 134–144)
eGFR: 90 mL/min/{1.73_m2} (ref >59)

## 2022-08-15 LAB — HEMOGLOBIN A1C
Est. average glucose Bld gHb Est-mCnc: 123 mg/dL
Hgb A1c MFr Bld: 5.9 % — ABNORMAL HIGH (ref 4.8–5.6)

## 2022-08-15 LAB — LIPID PANEL
Chol/HDL Ratio: 4.3 ratio (ref 0.0–5.0)
Cholesterol, Total: 120 mg/dL (ref 100–199)
HDL: 28 mg/dL — ABNORMAL LOW (ref >39)
LDL Chol Calc (NIH): 61 mg/dL (ref 0–99)
Triglycerides: 181 mg/dL — ABNORMAL HIGH (ref 0–149)
VLDL Cholesterol Cal: 31 mg/dL (ref 5–40)

## 2022-08-15 MED ORDER — LIDOCAINE 5 % EX PTCH: 1.0000 | MEDICATED_PATCH | CUTANEOUS | 0 refills | Status: DC

## 2022-08-15 MED ORDER — ONDANSETRON 4 MG PO TBDP: 4.0000 mg | ORAL_TABLET | Freq: Four times a day (QID) | ORAL | 0 refills | Status: DC | PRN

## 2022-08-15 MED ORDER — KETOROLAC TROMETHAMINE 30 MG/ML IJ SOLN
60.0000 mg | Freq: Once | INTRAMUSCULAR | Status: AC
  Administered 2022-08-15: 60 mg via INTRAMUSCULAR
  Filled 2022-08-15: qty 2

## 2022-08-15 MED ORDER — OXYCODONE-ACETAMINOPHEN 5-325 MG PO TABS: 2.0000 | ORAL_TABLET | Freq: Four times a day (QID) | ORAL | 0 refills | Status: DC | PRN

## 2022-08-15 MED ORDER — PREDNISONE 10 MG (21) PO TBPK: ORAL_TABLET | ORAL | 0 refills | Status: DC

## 2022-08-15 MED ORDER — MORPHINE SULFATE (PF) 4 MG/ML IV SOLN
4.0000 mg | Freq: Once | INTRAVENOUS | Status: AC
  Administered 2022-08-15: 4 mg via INTRAMUSCULAR
  Filled 2022-08-15: qty 1

## 2022-08-15 NOTE — ED Provider Notes (Signed)
[facility] Provider Note    Event Date/Time   First MD Initiated Contact with Patient 08/15/22 [phone]     (approximate)   History   Back Pain   HPI  Jorge Allen is a 41 y.o. male with sleep apnea, obesity, non-insulin-dependent diabetes who presents to the emergency department several days of lower back pain without radiation.  Was seen at urgent care yesterday and had 2 IM injections but he cannot recall what they were.  Was discharged with muscle relaxers and meloxicam.  States this is not helped his pain.  Denies numbness, tingling, weakness, bowel or bladder incontinence, urinary retention, fever, previous back surgery or epidural injection.  He is able to walk but states he has to do so slowly.   History provided by patient and wife.    Past Medical History:  Diagnosis Date   Sleep apnea     Past Surgical History:  Procedure Laterality Date   FOOT SURGERY     INCISION AND DRAINAGE ABSCESS Right 05/14/2021   Procedure: INCISION AND DRAINAGE ABSCESS;  Surgeon: Riki Altes, MD;  Location: [facility] ORS;  Service: Urology;  Laterality: Right;   TONSILLECTOMY      MEDICATIONS:  Prior to Admission medications   Medication Sig Start Date End Date Taking? Authorizing Provider  benzonatate (TESSALON PERLES) 100 MG capsule Take 1 capsule (100 mg total) by mouth every 6 (six) hours as needed. 03/18/22 03/18/23  Arnette Felts, FNP  buPROPion (WELLBUTRIN XL) 150 MG 24 hr tablet Take 1 tablet by mouth every morning. Patient not taking: Reported on 08/14/2022 07/09/21   [provider]  cyclobenzaprine (FLEXERIL) 10 MG tablet Take 1 tablet (10 mg total) by mouth 3 (three) times daily as needed for muscle spasms. 08/14/22   Arnette Felts, FNP  gabapentin (NEURONTIN) 300 MG capsule Take by mouth. Patient not taking: Reported on 08/14/2022 06/21/21   [provider]  hydrOXYzine (ATARAX) 10 MG tablet Take 1 tablet (10 mg total) by mouth 3  (three) times daily as needed. 08/14/22   Arnette Felts, FNP  metFORMIN (GLUCOPHAGE) 500 MG tablet Take 1 tablet (500 mg total) by mouth daily with breakfast. 03/18/22   Arnette Felts, FNP  MOBIC 15 MG tablet Take 1 tablet (15 mg total) by mouth daily. 08/14/22   Arnette Felts, FNP  omeprazole (PRILOSEC OTC) 20 MG tablet Take 1 tablet (20 mg total) by mouth daily. 03/18/22   Arnette Felts, FNP  phentermine (ADIPEX-P) 37.5 MG tablet Take 37.5 mg by mouth every morning. 04/15/21   [provider]    Physical Exam   Triage Vital Signs: ED Triage Vitals [08/15/22 0322]  Enc Vitals Group     BP 124/67     Pulse Rate 84     Resp 18     Temp 97.8 F (36.6 C)     Temp Source Oral     SpO2 100 %     Weight      Height      Head Circumference      Peak Flow      Pain Score 8     Pain Loc      Pain Edu?      Excl. in [place]?     Most recent vital signs: Vitals:   08/15/22 0500 08/15/22 0514  BP:  110/70  Pulse: 83   Resp:    Temp:    SpO2: 99%     CONSTITUTIONAL: Alert and oriented  and responds appropriately to questions.  Obese, no distress HEAD: Normocephalic, atraumatic EYES: Conjunctivae clear, pupils appear equal, sclera nonicteric ENT: normal nose; moist mucous membranes NECK: Supple, normal ROM CARD: RRR; S1 and S2 appreciated; no murmurs, no clicks, no rubs, no gallops RESP: Normal chest excursion without splinting or tachypnea; breath sounds clear and equal bilaterally; no wheezes, no rhonchi, no rales, no hypoxia or respiratory distress, speaking full sentences ABD/GI: Normal bowel sounds; non-distended; soft, non-tender, no rebound, no guarding, no peritoneal signs BACK: The back appears normal, no midline spinal tenderness, step-off or deformity.  Tender over the lower paraspinal muscles bilaterally without redness, warmth, ecchymosis, soft tissue swelling, rash. EXT: Normal ROM in all joints; no deformity noted, no edema; no cyanosis SKIN: Normal color for age and  race; warm; no rash on exposed skin NEURO: Moves all extremities equally, normal speech normal sensation, no saddle anesthesia, no hyperreflexia or clonus, ambulates with normal gait PSYCH: The patient's mood and manner are appropriate.   ED Results / Procedures / Treatments   LABS: (all labs ordered are listed, but only abnormal results are displayed) Labs Reviewed - No data to display   EKG:   RADIOLOGY: My personal review and interpretation of imaging:    I have personally reviewed all radiology reports.   No results found.   PROCEDURES:  Critical Care performed: No    Procedures    IMPRESSION / MDM / ASSESSMENT AND PLAN / ED COURSE  I reviewed the triage vital signs and the nursing notes.    Patient here with lower back pain.  Taking meloxicam and muscle relaxers without relief.    DIFFERENTIAL DIAGNOSIS (includes but not limited to):   Lower back strain, muscle spasm, doubt cauda equina, epidural abscess or hematoma, discitis or osteomyelitis, transverse myelitis, fracture   Patient's presentation is most consistent with acute, uncomplicated illness.   PLAN: We will give pain medication here.  Patient is requesting an IM injection of morphine as he states this is helped his sciatica before.  Patient was seen in 2019 for similar complaints and was given 2 rounds of IV morphine, IV Robaxin.  He has no indications for emergent spinal imaging.  He has no red flag symptoms.   MEDICATIONS GIVEN IN ED: Medications  morphine (PF) 4 MG/ML injection 4 mg (4 mg Intramuscular Given 08/15/22 0437)  ketorolac (TORADOL) 30 MG/ML injection 60 mg (60 mg Intramuscular Given 08/15/22 0507)     ED COURSE: Patient reports no improvement in pain after morphine but he is lying comfortably on his stomach, sleeping and snoring and has to be shaken to be aroused.  I do not feel further narcotics are indicated at this time especially with his morbid obesity and sleep apnea I worry  that further narcotic medications here could cause apnea.  We will give IM Toradol here.  Will discharge with short course of analgesia, Lidoderm patches and steroid taper.  Commended close outpatient follow-up.  Discussed at length return precautions with patient and wife.  Wife here to drive him home.  At this time, I do not feel there is any life-threatening condition present. I reviewed all nursing notes, vitals, pertinent previous records.  All lab and urine results, EKGs, imaging ordered have been independently reviewed and interpreted by myself.  I reviewed all available radiology reports from any imaging ordered this visit.  Based on my assessment, I feel the patient is safe to be discharged home without further emergent workup and can continue workup as  an outpatient as needed. Discussed all findings, treatment plan as well as usual and customary return precautions.  They verbalize understanding and are comfortable with this plan.  Outpatient follow-up has been provided as needed.  All questions have been answered.    CONSULTS:  none   OUTSIDE RECORDS REVIEWED: Reviewed patient's last bariatric note with Dr. Jeanice Lim on 09/03/2021.       FINAL CLINICAL IMPRESSION(S) / ED DIAGNOSES   Final diagnoses:  Acute bilateral low back pain without sciatica     Rx / DC Orders   ED Discharge Orders          Ordered    predniSONE (STERAPRED UNI-PAK 21 TAB) 10 MG (21) TBPK tablet        08/15/22 0412    oxyCODONE-acetaminophen (PERCOCET) 5-325 MG tablet  Every 6 hours PRN        08/15/22 0413    ondansetron (ZOFRAN-ODT) 4 MG disintegrating tablet  Every 6 hours PRN        08/15/22 0413    lidocaine (LIDODERM) 5 %  Every 24 hours        08/15/22 0502             Note:  This document was prepared using Dragon voice recognition software and may include unintentional dictation errors.   Jandiel Magallanes, Layla Maw, DO 08/15/22 [phone]

## 2022-08-15 NOTE — ED Triage Notes (Addendum)
Pt presents via POV with complaints of low back pain that started 2 days ago. Pt states that he was seen at [facility] who prescribed meloxicam and flexeril without any improvement in his sx. States he feels like someone is squeezing his lower back. Rates the pain 8/10. Hx of sciatic nerve pain. Denies CP or SOB.

## 2022-08-15 NOTE — Discharge Instructions (Addendum)

## 2022-08-19 ENCOUNTER — Other Ambulatory Visit: Payer: Self-pay | Admitting: Nurse Practitioner

## 2022-08-19 MED ORDER — VITAMIN D (ERGOCALCIFEROL) 1.25 MG (50000 UNIT) PO CAPS: 50000.0000 [IU] | ORAL_CAPSULE | ORAL | 1 refills | Status: DC

## 2022-08-20 ENCOUNTER — Telehealth: Payer: Self-pay

## 2022-08-20 NOTE — Telephone Encounter (Signed)
Transition Care Management Follow-up Telephone Call Date of discharge and from where: [facility]  How have you been since you were released from the hospital? Pt states he is still experiencing pain. Patient states he is est with an orthopedic but he has not given the office a call to follow up with them.  Any questions or concerns? No  Items Reviewed: Did the pt receive and understand the discharge instructions provided? Yes  Medications obtained and verified? Yes  Other? Yes  Any new allergies since your discharge? No  Dietary orders reviewed? Yes Do you have support at home? Yes   Home Care and Equipment/Supplies: Were home health services ordered? no If so, what is the name of the agency? N/a  Has the agency set up a time to come to the patient's home? no Were any new equipment or medical supplies ordered?  No What is the name of the medical supply agency? N/a Were you able to get the supplies/equipment? no Do you have any questions related to the use of the equipment or supplies? No  Functional Questionnaire: (I = Independent and D = Dependent) ADLs: i  Bathing/Dressing- i  Meal Prep- i  Eating- i  Maintaining continence- i  Transferring/Ambulation- i  Managing Meds- i  Follow up appointments reviewed:  PCP Hospital f/u appt confirmed? Yes  Scheduled to see janece moore on n/a @ n/a. [facility] f/u appt confirmed? No  Scheduled to see n/a on n/a @ n/a. Are transportation arrangements needed? No  If their condition worsens, is the pt aware to call PCP or go to the Emergency Dept.? Yes Was the patient provided with contact information for the PCP's office or ED? Yes Was to pt encouraged to call back with questions or concerns? Yes

## 2022-09-22 ENCOUNTER — Telehealth: Payer: Self-pay | Admitting: Podiatry

## 2022-09-22 NOTE — Telephone Encounter (Signed)
Pt wanting a refill of Rx that's a foot cream that comes in a jar.  Please advise.

## 2022-09-23 NOTE — Telephone Encounter (Signed)
Thank you, Dr. Madalyn Legner

## 2022-11-04 ENCOUNTER — Encounter (HOSPITAL_COMMUNITY): Payer: Self-pay

## 2022-11-04 ENCOUNTER — Ambulatory Visit (HOSPITAL_COMMUNITY)
Admission: EM | Admit: 2022-11-04 | Discharge: 2022-11-04 | Disposition: A | Discharge status: Home / Self Care | Payer: PRIVATE HEALTH INSURANCE | Attending: Family Medicine | Admitting: Family Medicine

## 2022-11-04 DIAGNOSIS — R052 Subacute cough: Secondary | ICD-10-CM

## 2022-11-04 MED ORDER — PREDNISONE 20 MG PO TABS: 40.0000 mg | ORAL_TABLET | Freq: Every day | ORAL | 0 refills | Status: AC

## 2022-11-04 MED ORDER — DOXYCYCLINE HYCLATE 100 MG PO CAPS: 100.0000 mg | ORAL_CAPSULE | Freq: Two times a day (BID) | ORAL | 0 refills | Status: AC

## 2022-11-04 MED ORDER — PROMETHAZINE-DM 6.25-15 MG/5ML PO SYRP: 5.0000 mL | ORAL_SOLUTION | Freq: Four times a day (QID) | ORAL | 0 refills | Status: DC | PRN

## 2022-11-04 NOTE — Discharge Instructions (Signed)
Take doxycycline 100 mg --1 capsule 2 times daily for 7 days  Take prednisone 20 mg--2 daily for 5 days  Take Phenergan with dextromethorphan syrup--5 mL or 1 teaspoon every 6 hours as needed for cough  Please followup with your primary care about this issue, especially if not completely resolving

## 2022-11-04 NOTE — ED Provider Notes (Signed)
[facility]    CSN: [id number] Arrival date & time: 11/04/22  1550      History   Chief Complaint Chief Complaint  Patient presents with   Cough    HPI Jorge Allen is a 42 y.o. male.    Cough  Here for a cough since November 2023. Is dry. Some h/a and sinus drainage lately.  No f/c at any point.   Does have h/o asthma, but no wheezing or dyspnea lately  Does not have a h/o asthma. Was rx'd tessalon perles and not helping a lot this time.  Past Medical History:  Diagnosis Date   Sleep apnea     Patient Active Problem List   Diagnosis Date Noted   Scrotal pain 05/14/2021   Sepsis (HCC) 05/14/2021   Sleep apnea 05/14/2021   NONSPEC ELEVATION OF LEVELS OF TRANSAMINASE/LDH 12/21/2007   TOBACCO USE 12/17/2007   GERD 12/17/2007    Past Surgical History:  Procedure Laterality Date   FOOT SURGERY     INCISION AND DRAINAGE ABSCESS Right 05/14/2021   Procedure: INCISION AND DRAINAGE ABSCESS;  Surgeon: Riki Altes, MD;  Location: [facility];  Service: Urology;  Laterality: Right;   TONSILLECTOMY         Home Medications    Prior to Admission medications   Medication Sig Start Date End Date Taking? Authorizing Provider  doxycycline (VIBRAMYCIN) 100 MG capsule Take 1 capsule (100 mg total) by mouth 2 (two) times daily for 7 days. 11/04/22 11/11/22 Yes Nyjah Schwake, Janace Aris, MD  predniSONE (DELTASONE) 20 MG tablet Take 2 tablets (40 mg total) by mouth daily with breakfast for 5 days. 11/04/22 11/09/22 Yes Zenia Resides, MD  promethazine-dextromethorphan (PROMETHAZINE-DM) 6.25-15 MG/5ML syrup Take 5 mLs by mouth 4 (four) times daily as needed for cough. 11/04/22  Yes Zenia Resides, MD  cyclobenzaprine (FLEXERIL) 10 MG tablet Take 1 tablet (10 mg total) by mouth 3 (three) times daily as needed for muscle spasms. 08/14/22   Arnette Felts, FNP  hydrOXYzine (ATARAX) 10 MG tablet Take 1 tablet (10 mg total) by mouth 3 (three) times daily as needed.  08/14/22   Arnette Felts, FNP  lidocaine (LIDODERM) 5 % Place 1 patch onto the skin daily. Remove & Discard patch within 12 hours or as directed by MD 08/15/22   Ward, Layla Maw, DO  metFORMIN (GLUCOPHAGE) 500 MG tablet Take 1 tablet (500 mg total) by mouth daily with breakfast. 03/18/22   Arnette Felts, FNP  MOBIC 15 MG tablet Take 1 tablet (15 mg total) by mouth daily. 08/14/22   Arnette Felts, FNP  omeprazole (PRILOSEC OTC) 20 MG tablet Take 1 tablet (20 mg total) by mouth daily. 03/18/22   Arnette Felts, FNP    Family History Family History  Problem Relation Age of Onset   Diabetes Mother    Cancer Mother    Dementia Mother    Hypertension Father    Cancer Father    Glaucoma Father    Dementia Maternal Grandmother    Cancer Maternal Grandfather    Hypertension Paternal Grandmother     Social History Social History   Tobacco Use   Smoking status: Every Day    Packs/day: 0.50    Years: 24.00    Total pack years: 12.00    Types: Cigarettes   Smokeless tobacco: Never  Vaping Use   Vaping Use: Every day   Substances: Nicotine  Substance Use Topics   Alcohol use: Yes  Alcohol/week: 10.0 standard drinks of alcohol    Types: 10 Shots of liquor per week    Comment: half gallon liquor   Drug use: No     Allergies   Patient has no known allergies.   Review of Systems Review of Systems  Respiratory:  Positive for cough.      Physical Exam Triage Vital Signs ED Triage Vitals  Enc Vitals Group     BP 11/04/22 1722 117/72     Pulse Rate 11/04/22 1722 88     Resp 11/04/22 1722 18     Temp 11/04/22 1722 98.3 F (36.8 C)     Temp Source 11/04/22 1722 Oral     SpO2 11/04/22 1722 96 %     Weight --      Height --      Head Circumference --      Peak Flow --      Pain Score 11/04/22 1723 0     Pain Loc --      Pain Edu? --      Excl. in [place]? --    No data found.  Updated Vital Signs BP 117/72 (BP Location: Left Arm)   Pulse 88   Temp 98.3 F (36.8 C)  (Oral)   Resp 18   SpO2 96%   Visual Acuity Right Eye Distance:   Left Eye Distance:   Bilateral Distance:    Right Eye Near:   Left Eye Near:    Bilateral Near:     Physical Exam Vitals reviewed.  Constitutional:      General: He is not in acute distress.    Appearance: He is not toxic-appearing.  HENT:     Nose: Nose normal.     Mouth/Throat:     Mouth: Mucous membranes are moist.     Pharynx: No oropharyngeal exudate or posterior oropharyngeal erythema.  Eyes:     Extraocular Movements: Extraocular movements intact.     Conjunctiva/sclera: Conjunctivae normal.     Pupils: Pupils are equal, round, and reactive to light.  Cardiovascular:     Rate and Rhythm: Normal rate and regular rhythm.     Heart sounds: No murmur heard. Pulmonary:     Effort: Pulmonary effort is normal.     Breath sounds: Normal breath sounds.  Musculoskeletal:     Cervical back: Neck supple.  Lymphadenopathy:     Cervical: No cervical adenopathy.  Skin:    Capillary Refill: Capillary refill takes less than 2 seconds.     Coloration: Skin is not jaundiced or pale.  Neurological:     General: No focal deficit present.     Mental Status: He is alert and oriented to person, place, and time.  Psychiatric:        Behavior: Behavior normal.      UC Treatments / Results  Labs (all labs ordered are listed, but only abnormal results are displayed) Labs Reviewed - No data to display  EKG   Radiology No results found.  Procedures Procedures (including critical care time)  Medications Ordered in UC Medications - No data to display  Initial Impression / Assessment and Plan / UC Course  I have reviewed the triage vital signs and the nursing notes.  Pertinent labs & imaging results that were available during my care of the patient were reviewed by me and considered in my medical decision making (see chart for details).        With the possiblity of atypicals, and ?double  sickening  with the sinus congestion/drainage/h/a, I am going to treat with doxycycline (pt states he is supposed to take it BID for a skin condition, but has not been taking it lately) and prednisone. Phenergan with DM sent.  He will f/u with his primary care Final Clinical Impressions(s) / UC Diagnoses   Final diagnoses:  Subacute cough     Discharge Instructions      Take doxycycline 100 mg --1 capsule 2 times daily for 7 days  Take prednisone 20 mg--2 daily for 5 days  Take Phenergan with dextromethorphan syrup--5 mL or 1 teaspoon every 6 hours as needed for cough  Please followup with your primary care about this issue, especially if not completely resolving     ED Prescriptions     Medication Sig Dispense Auth. Provider   doxycycline (VIBRAMYCIN) 100 MG capsule Take 1 capsule (100 mg total) by mouth 2 (two) times daily for 7 days. 14 capsule Barrett Henle, MD   promethazine-dextromethorphan (PROMETHAZINE-DM) 6.25-15 MG/5ML syrup Take 5 mLs by mouth 4 (four) times daily as needed for cough. 118 mL Barrett Henle, MD   predniSONE (DELTASONE) 20 MG tablet Take 2 tablets (40 mg total) by mouth daily with breakfast for 5 days. 10 tablet Windy Carina Gwenlyn Perking, MD      PDMP not reviewed this encounter.   Barrett Henle, MD 11/04/22 [phone]

## 2022-11-04 NOTE — ED Triage Notes (Signed)
Pt c/o dry cough intermittent since thanksgiving, worse at night. States took tessalon pearls with little relief.

## 2022-11-25 ENCOUNTER — Ambulatory Visit (INDEPENDENT_AMBULATORY_CARE_PROVIDER_SITE_OTHER): Payer: 59 | Admitting: Nurse Practitioner

## 2022-11-25 ENCOUNTER — Encounter: Payer: Self-pay | Admitting: Nurse Practitioner

## 2022-11-25 VITALS — BP 128/72 | HR 85 | Temp 98.3°F | Ht 63.0 in | Wt 298.0 lb

## 2022-11-25 DIAGNOSIS — Z6841 Body Mass Index (BMI) 40.0 and over, adult: Secondary | ICD-10-CM | POA: Diagnosis not present

## 2022-11-25 DIAGNOSIS — Z125 Encounter for screening for malignant neoplasm of prostate: Secondary | ICD-10-CM | POA: Diagnosis not present

## 2022-11-25 DIAGNOSIS — Z0000 Encounter for general adult medical examination without abnormal findings: Secondary | ICD-10-CM

## 2022-11-25 DIAGNOSIS — K219 Gastro-esophageal reflux disease without esophagitis: Secondary | ICD-10-CM

## 2022-11-25 DIAGNOSIS — R7303 Prediabetes: Secondary | ICD-10-CM | POA: Diagnosis not present

## 2022-11-25 DIAGNOSIS — F17200 Nicotine dependence, unspecified, uncomplicated: Secondary | ICD-10-CM | POA: Diagnosis not present

## 2022-11-25 DIAGNOSIS — Z1159 Encounter for screening for other viral diseases: Secondary | ICD-10-CM | POA: Diagnosis not present

## 2022-11-25 DIAGNOSIS — G4733 Obstructive sleep apnea (adult) (pediatric): Secondary | ICD-10-CM | POA: Diagnosis not present

## 2022-11-25 DIAGNOSIS — Z9989 Dependence on other enabling machines and devices: Secondary | ICD-10-CM | POA: Diagnosis not present

## 2022-11-25 DIAGNOSIS — E559 Vitamin D deficiency, unspecified: Secondary | ICD-10-CM | POA: Diagnosis not present

## 2022-11-25 MED ORDER — OMEPRAZOLE MAGNESIUM 20 MG PO TBEC: 20.0000 mg | DELAYED_RELEASE_TABLET | Freq: Every day | ORAL | 1 refills | Status: DC

## 2022-11-25 MED ORDER — WEGOVY 0.5 MG/0.5ML ~~LOC~~ SOAJ: 0.5000 mg | SUBCUTANEOUS | 0 refills | Status: DC

## 2022-11-25 MED ORDER — METFORMIN HCL 500 MG PO TABS: 500.0000 mg | ORAL_TABLET | Freq: Every day | ORAL | 1 refills | Status: DC

## 2022-11-25 NOTE — Progress Notes (Signed)
I,Jorge Allen,acting as a Education administrator for Jorge Brine, FNP.,have documented all relevant documentation on the behalf of Jorge Brine, FNP,as directed by  Jorge Brine, FNP while in the presence of Jorge Allen, [place].   Subjective:     Patient ID: Jorge Allen , male    DOB: 04-01-1981 , 42 y.o.   MRN: [id number]   Chief Complaint  Patient presents with   Annual Exam   Cough    Chronic, worse at night, sometimes wheeze?   Nicotine Dependence    HPI  Patient presents today for annual exam.  He is concerned about a chronic cough and nicotine dependence. He was on buproprion in the past. He used to go to [employer]. He had been taking phentermine and he started taking Ozempic.   Wt Readings from Last 3 Encounters: 11/25/22 : 298 lb (135.2 kg) 08/14/22 : 288 lb (130.6 kg) 03/18/22 : 292 lb (132.5 kg)    Cough There is no history of asthma.  Nicotine Dependence His urge triggers include company of smokers.     Past Medical History:  Diagnosis Date   Sleep apnea      Family History  Problem Relation Age of Onset   Diabetes Mother    Cancer Mother    Dementia Mother    Hypertension Father    Cancer Father    Glaucoma Father    Dementia Maternal Grandmother    Cancer Maternal Grandfather    Hypertension Paternal Grandmother      Current Outpatient Medications:    benzonatate (TESSALON) 100 MG capsule, Take 100 mg by mouth 3 (three) times daily as needed for cough., Disp: , Rfl:    cyclobenzaprine (FLEXERIL) 10 MG tablet, Take 1 tablet (10 mg total) by mouth 3 (three) times daily as needed for muscle spasms., Disp: 30 tablet, Rfl: 0   hydrOXYzine (ATARAX) 10 MG tablet, Take 1 tablet (10 mg total) by mouth 3 (three) times daily as needed., Disp: 30 tablet, Rfl: 2   MOBIC 15 MG tablet, Take 1 tablet (15 mg total) by mouth daily., Disp: 90 tablet, Rfl: 1   Semaglutide-Weight Management (WEGOVY) 0.5 MG/0.5ML SOAJ, Inject 0.5 mg into the skin once a week., Disp: 2 mL,  Rfl: 0   metFORMIN (GLUCOPHAGE) 500 MG tablet, Take 1 tablet (500 mg total) by mouth daily with breakfast., Disp: 90 tablet, Rfl: 1   omeprazole (PRILOSEC OTC) 20 MG tablet, Take 1 tablet (20 mg total) by mouth daily., Disp: 90 tablet, Rfl: 1   promethazine-dextromethorphan (PROMETHAZINE-DM) 6.25-15 MG/5ML syrup, Take 5 mLs by mouth 4 (four) times daily as needed for cough. (Patient not taking: Reported on 11/25/2022), Disp: 118 mL, Rfl: 0   No Known Allergies   Men's preventive visit. Patient Health Questionnaire (PHQ-2) is  [place] Office Visit from 11/25/2022 in [facility]  PHQ-2 Total Score 0     Patient is on a Regular diet. Not currently exercising [facility]. Marital status: Married. Relevant history for alcohol use is:  Social History   Substance and Sexual Activity  Alcohol Use Yes   Alcohol/week: 10.0 standard drinks of alcohol   Types: 10 Shots of liquor per week   Comment: half gallon liquor   Relevant history for tobacco use is:  Social History   Tobacco Use  Smoking Status Every Day   Packs/day: 0.50   Years: 24.00   Total pack years: 12.00  Types: Cigarettes  Smokeless Tobacco Never  .   Review of Systems  Constitutional: Negative.   HENT: Negative.    Eyes: Negative.   Respiratory: Negative.    Cardiovascular: Negative.   Gastrointestinal: Negative.   Endocrine: Negative.   Genitourinary: Negative.   Musculoskeletal: Negative.   Allergic/Immunologic: Negative.   Neurological: Negative.   Hematological: Negative.   Psychiatric/Behavioral: Negative.       Today's Vitals   11/25/22 1044  BP: 128/72  Pulse: 85  Temp: 98.3 F (36.8 C)  TempSrc: Oral  SpO2: 97%  Weight: 298 lb (135.2 kg)  Height: 5' 3"$  (1.6 m)   Body mass index is 52.79 kg/m.   Objective:  Physical Exam Vitals reviewed.  Constitutional:      General: He is not in acute distress.     Appearance: Normal appearance. He is obese.  HENT:     Head: Normocephalic and atraumatic.     Right Ear: Tympanic membrane, ear canal and external ear normal. There is no impacted cerumen.     Left Ear: Tympanic membrane, ear canal and external ear normal. There is no impacted cerumen.     Nose: Nose normal.     Mouth/Throat:     Mouth: Mucous membranes are dry.  Cardiovascular:     Rate and Rhythm: Normal rate and regular rhythm.     Pulses: Normal pulses.     Heart sounds: Normal heart sounds. No murmur heard. Pulmonary:     Effort: Pulmonary effort is normal. No respiratory distress.     Breath sounds: Normal breath sounds. No wheezing.  Abdominal:     General: Abdomen is flat. Bowel sounds are normal. There is no distension.     Palpations: Abdomen is soft.  Genitourinary:    Prostate: Normal.     Rectum: Guaiac result negative.  Musculoskeletal:        General: No swelling or tenderness. Normal range of motion.     Cervical back: Normal range of motion and neck supple.  Skin:    General: Skin is warm and dry.     Capillary Refill: Capillary refill takes less than 2 seconds.  Neurological:     General: No focal deficit present.     Mental Status: He is alert and oriented to person, place, and time.     Cranial Nerves: No cranial nerve deficit.     Motor: No weakness.  Psychiatric:        Mood and Affect: Mood normal.        Behavior: Behavior normal.        Thought Content: Thought content normal.        Judgment: Judgment normal.         Assessment And Plan:    1. Encounter for health maintenance examination Behavior modifications discussed and diet history reviewed.   Pt will continue to exercise regularly and modify diet with low GI, plant based foods and decrease intake of processed foods.  Recommend intake of daily multivitamin, Vitamin D, and calcium.  Recommend for preventive screenings, as well as recommend immunizations that include influenza, TDAP, and  Shingles  2. Encounter for prostate cancer screening - PSA  3. Need for hepatitis C screening test Will check Hepatitis C screening due to recent recommendations to screen all adults 18 years and older - Hepatitis C antibody  4. Class 3 severe obesity due to excess calories without serious comorbidity with body mass index (BMI) of 50.0 to 59.9 in adult [facility])  Comments: Will try him on [facility] pending insurance coverage. Discussed side effects to include nausea and constipation. I have advised him to not take other peoples medication as he does not know the effects it may have. Goal to exercise 150 minutes per week with at least 2 days of strength training.  Encouraged to park further when at the store, take stairs instead of elevators and to walk in place during commercials.  Increase water intake to at least one gallon of water daily. - CBC - CMP14+EGFR - Lipid panel  5. Prediabetes Comments: Stable, encouraged to focus on diet low in sugar and starches. Increase physical activity to at least 150 minutes per week - Hemoglobin A1c - metFORMIN (GLUCOPHAGE) 500 MG tablet; Take 1 tablet (500 mg total) by mouth daily with breakfast.  Dispense: 90 tablet; Refill: 1  6. Vitamin D deficiency Will check vitamin D level and supplement as needed.    Also encouraged to spend 15 minutes in the sun daily.  - VITAMIN D 25 Hydroxy (Vit-D Deficiency, Fractures)  7. Gastroesophageal reflux disease without esophagitis Comments: This can be related to taking GLP1s and to avoid food triggers. Increase physical activity - omeprazole (PRILOSEC OTC) 20 MG tablet; Take 1 tablet (20 mg total) by mouth daily.  Dispense: 90 tablet; Refill: 1  8. Obstructive sleep apnea syndrome  9. CPAP (continuous positive airway pressure) dependence Comments: He has been using nightly and has benefit from wearing regularly  10. TOBACCO USE Smoking cessation instruction/counseling given:  counseled patient on the dangers of  tobacco use, advised patient to stop smoking, and reviewed strategies to maximize success. Will start him on nicotine patches at his request to help him to quit smoking    Patient was given opportunity to ask questions. Patient verbalized understanding of the plan and was able to repeat key elements of the plan. All questions were answered to their satisfaction.   Jorge Brine, FNP   I, Jorge Brine, FNP, have reviewed all documentation for this visit. The documentation on 11/25/22 for the exam, diagnosis, procedures, and orders are all accurate and complete.   THE PATIENT IS ENCOURAGED TO PRACTICE SOCIAL DISTANCING DUE TO THE COVID-19 PANDEMIC.

## 2022-11-26 LAB — CBC
Hematocrit: 41.9 % (ref 37.5–51.0)
Hemoglobin: 13.6 g/dL (ref 13.0–17.7)
MCH: 27.5 pg (ref 26.6–33.0)
MCHC: 32.5 g/dL (ref 31.5–35.7)
MCV: 85 fL (ref 79–97)
Platelets: 232 10*3/uL (ref 150–450)
RBC: 4.94 x10E6/uL (ref 4.14–5.80)
RDW: 13.5 % (ref 11.6–15.4)
WBC: 9.7 10*3/uL (ref 3.4–10.8)

## 2022-11-26 LAB — CMP14+EGFR
ALT: 19 IU/L (ref 0–44)
AST: 18 IU/L (ref 0–40)
Albumin/Globulin Ratio: 1.6 (ref 1.2–2.2)
Albumin: 4.1 g/dL (ref 4.1–5.1)
Alkaline Phosphatase: 79 IU/L (ref 44–121)
BUN/Creatinine Ratio: 25 — ABNORMAL HIGH (ref 9–20)
BUN: 16 mg/dL (ref 6–24)
Bilirubin Total: 0.2 mg/dL (ref 0.0–1.2)
CO2: 23 mmol/L (ref 20–29)
Calcium: 9.1 mg/dL (ref 8.7–10.2)
Chloride: 103 mmol/L (ref 96–106)
Creatinine, Ser: 0.65 mg/dL — ABNORMAL LOW (ref 0.76–1.27)
Globulin, Total: 2.6 g/dL (ref 1.5–4.5)
Glucose: 87 mg/dL (ref 70–99)
Potassium: 4.3 mmol/L (ref 3.5–5.2)
Sodium: 139 mmol/L (ref 134–144)
Total Protein: 6.7 g/dL (ref 6.0–8.5)
eGFR: 121 mL/min/{1.73_m2} (ref >59)

## 2022-11-26 LAB — HEPATITIS C ANTIBODY: Hep C Virus Ab: NONREACTIVE

## 2022-11-26 LAB — HEMOGLOBIN A1C
Est. average glucose Bld gHb Est-mCnc: 120 mg/dL
Hgb A1c MFr Bld: 5.8 % — ABNORMAL HIGH (ref 4.8–5.6)

## 2022-11-26 LAB — PSA: Prostate Specific Ag, Serum: 0.5 ng/mL (ref 0.0–4.0)

## 2022-11-26 LAB — LIPID PANEL
Chol/HDL Ratio: 4 ratio (ref 0.0–5.0)
Cholesterol, Total: 152 mg/dL (ref 100–199)
HDL: 38 mg/dL — ABNORMAL LOW (ref >39)
LDL Chol Calc (NIH): 97 mg/dL (ref 0–99)
Triglycerides: 91 mg/dL (ref 0–149)
VLDL Cholesterol Cal: 17 mg/dL (ref 5–40)

## 2022-11-26 LAB — VITAMIN D 25 HYDROXY (VIT D DEFICIENCY, FRACTURES): Vit D, 25-Hydroxy: 10.6 ng/mL — ABNORMAL LOW (ref 30.0–100.0)

## 2022-12-04 ENCOUNTER — Telehealth: Payer: Self-pay

## 2022-12-04 NOTE — Telephone Encounter (Signed)
PA required for [facility]  Started via [place]  (Key: [id number]

## 2022-12-05 NOTE — Telephone Encounter (Signed)
Initial request denied. Appeal form completed via [place].

## 2022-12-07 MED ORDER — NICOTINE 21 MG/24HR TD PT24: 21.0000 mg | MEDICATED_PATCH | TRANSDERMAL | 1 refills | Status: DC

## 2022-12-07 MED ORDER — VITAMIN D (ERGOCALCIFEROL) 1.25 MG (50000 UNIT) PO CAPS: 50000.0000 [IU] | ORAL_CAPSULE | ORAL | 1 refills | Status: DC

## 2022-12-09 NOTE — Telephone Encounter (Signed)
PA/Appeal for [employer] [facility] DENIED: Drug Not Covered - The request for coverage was denied because the prescription plan does not cover the requested medication.  Patient aware. He is going to contact his ins co and ask if there are any alternatives. Advised patient there may not be since this medication is not a covered benefit. He will let us know if they cover anything else he would like to try. Nothing further needed at this time.

## 2022-12-10 ENCOUNTER — Emergency Department
Admission: EM | Admit: 2022-12-10 | Discharge: 2022-12-10 | Disposition: A | Discharge status: Home / Self Care | Payer: 59 | Attending: Emergency Medicine | Admitting: Emergency Medicine

## 2022-12-10 ENCOUNTER — Encounter: Payer: Self-pay | Admitting: Emergency Medicine

## 2022-12-10 ENCOUNTER — Other Ambulatory Visit: Payer: Self-pay

## 2022-12-10 DIAGNOSIS — D72829 Elevated white blood cell count, unspecified: Secondary | ICD-10-CM | POA: Diagnosis not present

## 2022-12-10 DIAGNOSIS — R112 Nausea with vomiting, unspecified: Secondary | ICD-10-CM | POA: Diagnosis not present

## 2022-12-10 DIAGNOSIS — Z1152 Encounter for screening for COVID-19: Secondary | ICD-10-CM | POA: Diagnosis not present

## 2022-12-10 DIAGNOSIS — K529 Noninfective gastroenteritis and colitis, unspecified: Secondary | ICD-10-CM | POA: Insufficient documentation

## 2022-12-10 DIAGNOSIS — E119 Type 2 diabetes mellitus without complications: Secondary | ICD-10-CM | POA: Insufficient documentation

## 2022-12-10 LAB — RESP PANEL BY RT-PCR (RSV, FLU A&B, COVID)  RVPGX2
Influenza A by PCR: NEGATIVE
Influenza B by PCR: NEGATIVE
Resp Syncytial Virus by PCR: NEGATIVE
SARS Coronavirus 2 by RT PCR: NEGATIVE

## 2022-12-10 LAB — URINALYSIS, ROUTINE W REFLEX MICROSCOPIC
Bilirubin Urine: NEGATIVE
Glucose, UA: NEGATIVE mg/dL
Hgb urine dipstick: NEGATIVE
Ketones, ur: NEGATIVE mg/dL
Leukocytes,Ua: NEGATIVE
Nitrite: NEGATIVE
Protein, ur: NEGATIVE mg/dL
Specific Gravity, Urine: 1.027 (ref 1.005–1.030)
pH: 5 (ref 5.0–8.0)

## 2022-12-10 LAB — CBC WITH DIFFERENTIAL/PLATELET
Abs Immature Granulocytes: 0.04 10*3/uL (ref 0.00–0.07)
Basophils Absolute: 0 10*3/uL (ref 0.0–0.1)
Basophils Relative: 0 %
Eosinophils Absolute: 0.1 10*3/uL (ref 0.0–0.5)
Eosinophils Relative: 1 %
HCT: 49.9 % (ref 39.0–52.0)
Hemoglobin: 16.1 g/dL (ref 13.0–17.0)
Immature Granulocytes: 0 %
Lymphocytes Relative: 5 %
Lymphs Abs: 0.7 10*3/uL (ref 0.7–4.0)
MCH: 27.2 pg (ref 26.0–34.0)
MCHC: 32.3 g/dL (ref 30.0–36.0)
MCV: 84.1 fL (ref 80.0–100.0)
Monocytes Absolute: 0.9 10*3/uL (ref 0.1–1.0)
Monocytes Relative: 6 %
Neutro Abs: 12 10*3/uL — ABNORMAL HIGH (ref 1.7–7.7)
Neutrophils Relative %: 88 %
Platelets: 246 10*3/uL (ref 150–400)
RBC: 5.93 MIL/uL — ABNORMAL HIGH (ref 4.22–5.81)
RDW: 14.5 % (ref 11.5–15.5)
WBC: 13.7 10*3/uL — ABNORMAL HIGH (ref 4.0–10.5)
nRBC: 0 % (ref 0.0–0.2)

## 2022-12-10 LAB — COMPREHENSIVE METABOLIC PANEL
ALT: 29 U/L (ref 0–44)
AST: 21 U/L (ref 15–41)
Albumin: 4.3 g/dL (ref 3.5–5.0)
Alkaline Phosphatase: 75 U/L (ref 38–126)
Anion gap: 10 (ref 5–15)
BUN: 17 mg/dL (ref 6–20)
CO2: 24 mmol/L (ref 22–32)
Calcium: 9.3 mg/dL (ref 8.9–10.3)
Chloride: 104 mmol/L (ref 98–111)
Creatinine, Ser: 0.81 mg/dL (ref 0.61–1.24)
GFR, Estimated: >60 mL/min (ref >60)
Glucose, Bld: 116 mg/dL — ABNORMAL HIGH (ref 70–99)
Potassium: 4 mmol/L (ref 3.5–5.1)
Sodium: 138 mmol/L (ref 135–145)
Total Bilirubin: 1 mg/dL (ref 0.3–1.2)
Total Protein: 8.3 g/dL — ABNORMAL HIGH (ref 6.5–8.1)

## 2022-12-10 LAB — LIPASE, BLOOD: Lipase: 34 U/L (ref 11–51)

## 2022-12-10 MED ORDER — DICYCLOMINE HCL 10 MG PO CAPS: 10.0000 mg | ORAL_CAPSULE | Freq: Three times a day (TID) | ORAL | 0 refills | Status: DC | PRN

## 2022-12-10 MED ORDER — SODIUM CHLORIDE 0.9 % IV BOLUS
1000.0000 mL | Freq: Once | INTRAVENOUS | Status: AC
  Administered 2022-12-10: 1000 mL via INTRAVENOUS

## 2022-12-10 MED ORDER — ONDANSETRON 4 MG PO TBDP
4.0000 mg | ORAL_TABLET | Freq: Once | ORAL | Status: AC
  Administered 2022-12-10: 4 mg via ORAL
  Filled 2022-12-10: qty 1

## 2022-12-10 MED ORDER — FAMOTIDINE IN NACL 20-0.9 MG/50ML-% IV SOLN
20.0000 mg | Freq: Once | INTRAVENOUS | Status: AC
  Administered 2022-12-10: 20 mg via INTRAVENOUS
  Filled 2022-12-10: qty 50

## 2022-12-10 MED ORDER — ONDANSETRON 8 MG PO TBDP: 8.0000 mg | ORAL_TABLET | Freq: Three times a day (TID) | ORAL | 0 refills | Status: DC | PRN

## 2022-12-10 MED ORDER — DICYCLOMINE HCL 10 MG PO CAPS
10.0000 mg | ORAL_CAPSULE | Freq: Once | ORAL | Status: AC
  Administered 2022-12-10: 10 mg via ORAL
  Filled 2022-12-10: qty 1

## 2022-12-10 MED ORDER — ACETAMINOPHEN 500 MG PO TABS
1000.0000 mg | ORAL_TABLET | Freq: Once | ORAL | Status: AC
  Administered 2022-12-10: 1000 mg via ORAL
  Filled 2022-12-10: qty 2

## 2022-12-10 NOTE — ED Provider Notes (Signed)
[facility] Provider Note    Event Date/Time   First MD Initiated Contact with Patient 12/10/22 [phone]     (approximate)   History   Emesis   HPI  Jorge Allen is a 42 y.o. male with a history of sleep apnea, obesity, non-insulin-dependent diabetes who presents with nausea, vomiting, diarrhea since yesterday.  The patient reports over 10 episodes of diarrhea which was watery and nonbloody.  He vomited several times as well.  He reports crampy diffuse abdominal pain.  He states his daughter was sick with similar symptoms a few days ago but tested negative for flu and COVID.  He has no other sick contacts.   I reviewed the past medical records.  The patient's most recent outpatient encounter was with family medicine on 2/6 for an annual exam and follow-up of his chronic conditions.   Physical Exam   Triage Vital Signs: ED Triage Vitals  Enc Vitals Group     BP 12/10/22 0037 132/76     Pulse Rate 12/10/22 0037 97     Resp 12/10/22 0037 19     Temp 12/10/22 0037 (!) 101.3 F (38.5 C)     Temp Source 12/10/22 0037 Oral     SpO2 12/10/22 0037 96 %     Weight 12/10/22 0043 290 lb (131.5 kg)     Height 12/10/22 0043 5' 5"$  (1.651 m)     Head Circumference --      Peak Flow --      Pain Score 12/10/22 0042 3     Pain Loc --      Pain Edu? --      Excl. in [place]? --     Most recent vital signs: Vitals:   12/10/22 0037  BP: 132/76  Pulse: 97  Resp: 19  Temp: (!) 101.3 F (38.5 C)  SpO2: 96%     General: Awake, no distress.  CV:  Good peripheral perfusion.  Resp:  Normal effort.  Abd:  Soft and nontender.  No distention.  Other:  Dry mucous membranes.   ED Results / Procedures / Treatments   Labs (all labs ordered are listed, but only abnormal results are displayed) Labs Reviewed  CBC WITH DIFFERENTIAL/PLATELET - Abnormal; Notable for the following components:      Result Value   WBC 13.7 (*)    RBC 5.93 (*)    Neutro Abs 12.0 (*)     All other components within normal limits  COMPREHENSIVE METABOLIC PANEL - Abnormal; Notable for the following components:   Glucose, Bld 116 (*)    Total Protein 8.3 (*)    All other components within normal limits  URINALYSIS, ROUTINE W REFLEX MICROSCOPIC - Abnormal; Notable for the following components:   Color, Urine YELLOW (*)    APPearance CLEAR (*)    All other components within normal limits  RESP PANEL BY RT-PCR (RSV, FLU A&B, COVID)  RVPGX2  LIPASE, BLOOD     EKG     RADIOLOGY     PROCEDURES:  Critical Care performed: No  Procedures   MEDICATIONS ORDERED IN ED: Medications  ondansetron (ZOFRAN-ODT) disintegrating tablet 4 mg (4 mg Oral Given 12/10/22 0047)  acetaminophen (TYLENOL) tablet 1,000 mg (1,000 mg Oral Given 12/10/22 0047)  sodium chloride 0.9 % bolus 1,000 mL (1,000 mLs Intravenous New Bag/Given 12/10/22 0547)  famotidine (PEPCID) IVPB 20 mg premix (0 mg Intravenous Stopped 12/10/22 0619)  dicyclomine (BENTYL) capsule 10 mg (10 mg Oral Given  12/10/22 0549)     IMPRESSION / MDM / ASSESSMENT AND PLAN / ED COURSE  I reviewed the triage vital signs and the nursing notes.  42 year old male with PMH as noted above presents with nausea, vomiting, and diarrhea since yesterday.  He has crampy abdominal pain but no tenderness on exam.  He is febrile with otherwise normal vital signs.  Differential diagnosis includes, but is not limited to, viral gastroenteritis, foodborne illness, other viral syndrome.  I have a low suspicion for acute intra-abdominal process given the lack of any tenderness or focal abdominal pain.  Initial lab workup is reassuring.  CBC shows mild leukocytosis.  Electrolytes, LFTs, lipase are normal.  Respiratory panel is negative.  We will give a fluid bolus, Bentyl and Pepcid for symptomatic treatment, and reassess.  There is no indication for imaging at this time.  Patient's presentation is most consistent with acute presentation with  potential threat to life or bodily function.  ----------------------------------------- 6:45 AM on 12/10/2022 -----------------------------------------  The patient is feeling better.  He is tolerating p.o.  He is stable for discharge at this time.  I counseled him on the results of the workup.  I will prescribe some medications for symptomatic treatment.  Gave strict return precautions and he expressed understanding.   FINAL CLINICAL IMPRESSION(S) / ED DIAGNOSES   Final diagnoses:  Gastroenteritis     Rx / DC Orders   ED Discharge Orders     None        Note:  This document was prepared using Dragon voice recognition software and may include unintentional dictation errors.    Arta Silence, MD 12/10/22 [id number]

## 2022-12-10 NOTE — ED Triage Notes (Signed)
Patient ambulatory to triage with steady gait, without difficulty or distress noted; pt reports N/V/D and abd cramping tonight

## 2022-12-10 NOTE — Discharge Instructions (Addendum)
Take the Bentyl as needed for abdominal pain/cramping, and the Zofran as needed for nausea.  You may take ibuprofen or Tylenol as needed for fever.  Return to the ER for new, worsening, or persistent severe abdominal pain, persistent high fever, weakness, vomiting not getting better with the medication, blood in the stool, or any other new or worsening symptoms that concern you.

## 2022-12-17 ENCOUNTER — Telehealth: Payer: Self-pay

## 2022-12-17 NOTE — Transitions of Care (Post Inpatient/ED Visit) (Signed)
   12/17/2022  Name: Jorge Allen MRN: [id number] DOB: December 05, 1980  Today's TOC FU Call Status: Today's TOC FU Call Status:: Successful TOC FU Call Competed TOC FU Call Complete Date: 12/17/22  Transition Care Management Follow-up Telephone Call Date of Discharge: 12/10/22 Discharge Facility[facility]) Type of Discharge: Inpatient Admission Primary Inpatient Discharge Diagnosis:: Gastroenteritis How have you been since you were released from the hospital?: Better Any questions or concerns?: No  Items Reviewed: Did you receive and understand the discharge instructions provided?: Yes Medications obtained and verified?: Yes (Medications Reviewed) Any new allergies since your discharge?: No Dietary orders reviewed?: No Do you have support at home?: Yes People in Home: spouse  Home Care and Equipment/Supplies: [place] Ordered?: No Any new equipment or medical supplies ordered?: No  Functional Questionnaire: Do you need assistance with bathing/showering or dressing?: No Do you need assistance with meal preparation?: No Do you need assistance with eating?: No Do you have difficulty maintaining continence: No Do you need assistance with getting out of bed/getting out of a chair/moving?: No Do you have difficulty managing or taking your medications?: No  Folllow up appointments reviewed: PCP Follow-up appointment confirmed?: No MD Provider Line Number:[phone] Given: Yes [facility] Follow-up appointment confirmed?: No Reason Specialist Follow-Up Not Confirmed: Patient has Specialist Provider Number and will Call for Appointment Do you need transportation to your follow-up appointment?: No Do you understand care options if your condition(s) worsen?: Yes-patient verbalized understanding    SIGNATURE Tami Lin

## 2023-03-24 ENCOUNTER — Encounter: Payer: Self-pay | Admitting: Nurse Practitioner

## 2023-03-24 ENCOUNTER — Ambulatory Visit (INDEPENDENT_AMBULATORY_CARE_PROVIDER_SITE_OTHER): Payer: 59 | Admitting: Nurse Practitioner

## 2023-03-24 VITALS — BP 128/80 | HR 92 | Temp 99.1°F | Ht 65.0 in | Wt 291.8 lb

## 2023-03-24 DIAGNOSIS — R0981 Nasal congestion: Secondary | ICD-10-CM | POA: Diagnosis not present

## 2023-03-24 DIAGNOSIS — Z6841 Body Mass Index (BMI) 40.0 and over, adult: Secondary | ICD-10-CM | POA: Diagnosis not present

## 2023-03-24 DIAGNOSIS — R7303 Prediabetes: Secondary | ICD-10-CM | POA: Diagnosis not present

## 2023-03-24 DIAGNOSIS — E559 Vitamin D deficiency, unspecified: Secondary | ICD-10-CM | POA: Diagnosis not present

## 2023-03-24 LAB — POC COVID19 BINAXNOW: SARS Coronavirus 2 Ag: NEGATIVE

## 2023-03-24 MED ORDER — CETIRIZINE HCL 10 MG PO TABS: 10.0000 mg | ORAL_TABLET | Freq: Every day | ORAL | 2 refills | Status: DC

## 2023-03-24 MED ORDER — MOMETASONE FUROATE 50 MCG/ACT NA SUSP: 2.0000 | Freq: Every day | NASAL | 2 refills | Status: AC

## 2023-03-24 NOTE — Progress Notes (Signed)
Jeri Cos [place],acting as a Neurosurgeon for Arnette Felts, FNP.,have documented all relevant documentation on the behalf of Arnette Felts, FNP,as directed by  Arnette Felts, FNP while in the presence of Arnette Felts, FNP.    Subjective:     Patient ID: Jorge Allen , male    DOB: 03-Jul-1981 , 42 y.o.   MRN: [id number]   Chief Complaint  Patient presents with   Prediabetes    HPI  Patient presents today for a prediabetes and vitamin d check. Patient reports he is taking the metformin once or twice a week. Patient also complains of nasal congestion and postnasal drip. He reports he went to 3 cookouts on memorial weekend.   He is walking with work -  Diet -   Wt Readings from Last 3 Encounters: 03/24/23 : 291 lb 12.8 oz (132.4 kg) 12/10/22 : 290 lb (131.5 kg) 11/25/22 : 298 lb (135.2 kg)       Past Medical History:  Diagnosis Date   Sleep apnea      Family History  Problem Relation Age of Onset   Diabetes Mother    Cancer Mother    Dementia Mother    Hypertension Father    Cancer Father    Glaucoma Father    Dementia Maternal Grandmother    Cancer Maternal Grandfather    Hypertension Paternal Grandmother      Current Outpatient Medications:    benzonatate (TESSALON) 100 MG capsule, Take 100 mg by mouth 3 (three) times daily as needed for cough., Disp: , Rfl:    cetirizine (ZYRTEC ALLERGY) 10 MG tablet, Take 1 tablet (10 mg total) by mouth daily., Disp: 30 tablet, Rfl: 2   cyclobenzaprine (FLEXERIL) 10 MG tablet, Take 1 tablet (10 mg total) by mouth 3 (three) times daily as needed for muscle spasms., Disp: 30 tablet, Rfl: 0   dicyclomine (BENTYL) 10 MG capsule, Take 1 capsule (10 mg total) by mouth every 8 (eight) hours as needed (abdominal cramping)., Disp: 12 capsule, Rfl: 0   hydrOXYzine (ATARAX) 10 MG tablet, Take 1 tablet (10 mg total) by mouth 3 (three) times daily as needed., Disp: 30 tablet, Rfl: 2   metFORMIN (GLUCOPHAGE) 500 MG tablet, Take 1 tablet (500  mg total) by mouth daily with breakfast., Disp: 90 tablet, Rfl: 1   MOBIC 15 MG tablet, Take 1 tablet (15 mg total) by mouth daily., Disp: 90 tablet, Rfl: 1   mometasone (NASONEX) 50 MCG/ACT nasal spray, Place 2 sprays into the nose daily., Disp: 1 each, Rfl: 2   omeprazole (PRILOSEC OTC) 20 MG tablet, Take 1 tablet (20 mg total) by mouth daily., Disp: 90 tablet, Rfl: 1   Semaglutide-Weight Management (WEGOVY) 0.5 MG/0.5ML SOAJ, Inject 0.5 mg into the skin once a week., Disp: 2 mL, Rfl: 0   naproxen (NAPROSYN) 250 MG tablet, Take 1 tablet (250 mg total) by mouth 2 (two) times daily with a meal for 10 days., Disp: 20 tablet, Rfl: 0   Vitamin D, Ergocalciferol, (DRISDOL) 1.25 MG (50000 UNIT) CAPS capsule, Take 1 capsule (50,000 Units total) by mouth 2 (two) times a week., Disp: 24 capsule, Rfl: 1   No Known Allergies   Review of Systems  Constitutional: Negative.   Eyes: Negative.   Respiratory: Negative.    Cardiovascular: Negative.   Endocrine: Negative for polydipsia, polyphagia and polyuria.  Musculoskeletal: Negative.   Skin: Negative.   Psychiatric/Behavioral: Negative.       Today's Vitals   03/24/23 1136  BP:  128/80  Pulse: 92  Temp: 99.1 F (37.3 C)  Weight: 291 lb 12.8 oz (132.4 kg)  Height: 5\' 5"  (1.651 m)  PainSc: 0-No pain   Body mass index is 48.56 kg/m.  The 10-year ASCVD risk score (Arnett DK, et al., 2019) is: 6.7%   Values used to calculate the score:     Age: 16 years     Sex: Male     Is Non-Hispanic African American: Yes     Diabetic: No     Tobacco smoker: Yes     Systolic Blood Pressure: 137 mmHg     Is BP treated: No     HDL Cholesterol: 38 mg/dL     Total Cholesterol: 152 mg/dL  Objective:  Physical Exam Vitals reviewed.  Constitutional:      General: He is not in acute distress.    Appearance: Normal appearance. He is obese.  Cardiovascular:     Rate and Rhythm: Normal rate and regular rhythm.     Pulses: Normal pulses.     Heart sounds:  Normal heart sounds. No murmur heard. Pulmonary:     Effort: Pulmonary effort is normal. No respiratory distress.     Breath sounds: Normal breath sounds. No wheezing.  Musculoskeletal:        General: No swelling, tenderness, deformity or signs of injury. Normal range of motion.  Skin:    General: Skin is warm.     Capillary Refill: Capillary refill takes less than 2 seconds.  Neurological:     General: No focal deficit present.     Mental Status: He is alert and oriented to person, place, and time.     Cranial Nerves: No cranial nerve deficit.     Motor: No weakness.         Assessment And Plan:     1. Prediabetes Comments: HgbA1c has been stable and slightly improved, encouraged to continue focusing on healthy diet low in sugar and starches. - CMP14+EGFR - Hemoglobin A1c - VITAMIN D 25 Hydroxy (Vit-D Deficiency, Fractures)  2. Vitamin D deficiency Will check vitamin D level and supplement as needed.    Also encouraged to spend 15 minutes in the sun daily.   3. Nasal congestion Comments: Negative rapid covid. Rx for antihistamine and encouraged to stay well hydrated with water. - POC COVID-19 - CBC with Differential/Platelet - mometasone (NASONEX) 50 MCG/ACT nasal spray; Place 2 sprays into the nose daily.  Dispense: 1 each; Refill: 2 - cetirizine (ZYRTEC ALLERGY) 10 MG tablet; Take 1 tablet (10 mg total) by mouth daily.  Dispense: 30 tablet; Refill: 2  4. Class 3 severe obesity due to excess calories with body mass index (BMI) of 45.0 to 49.9 in adult, unspecified whether serious comorbidity present [facility]) he is encouraged to strive for BMI less than 30 to decrease cardiac risk. Advised to aim for at least 150 minutes of exercise per week. Encouraged to park further when at the store, take stairs instead of elevators and to walk in place during commercials. Increase water intake to at least one gallon of water daily.   No follow-ups on file.   Patient was given  opportunity to ask questions. Patient verbalized understanding of the plan and was able to repeat key elements of the plan. All questions were answered to their satisfaction.  Arnette Felts, FNP   I, Arnette Felts, FNP, have reviewed all documentation for this visit. The documentation on 03/24/23 for the exam, diagnosis, procedures, and orders are all  accurate and complete.   IF YOU HAVE BEEN REFERRED TO A SPECIALIST, IT MAY TAKE 1-2 WEEKS TO SCHEDULE/PROCESS THE REFERRAL. IF YOU HAVE NOT HEARD FROM US/SPECIALIST IN TWO WEEKS, PLEASE GIVE [place] A CALL AT [phone].   THE PATIENT IS ENCOURAGED TO PRACTICE SOCIAL DISTANCING DUE TO THE COVID-19 PANDEMIC.

## 2023-03-25 ENCOUNTER — Emergency Department: Payer: 59

## 2023-03-25 ENCOUNTER — Emergency Department
Admission: EM | Admit: 2023-03-25 | Discharge: 2023-03-25 | Disposition: A | Discharge status: Home / Self Care | Payer: 59 | Attending: Emergency Medicine | Admitting: Emergency Medicine

## 2023-03-25 ENCOUNTER — Other Ambulatory Visit: Payer: Self-pay

## 2023-03-25 ENCOUNTER — Other Ambulatory Visit: Payer: Self-pay | Admitting: Nurse Practitioner

## 2023-03-25 DIAGNOSIS — R0602 Shortness of breath: Secondary | ICD-10-CM | POA: Diagnosis not present

## 2023-03-25 DIAGNOSIS — R0981 Nasal congestion: Secondary | ICD-10-CM | POA: Insufficient documentation

## 2023-03-25 DIAGNOSIS — R0789 Other chest pain: Secondary | ICD-10-CM | POA: Diagnosis not present

## 2023-03-25 DIAGNOSIS — M546 Pain in thoracic spine: Secondary | ICD-10-CM | POA: Diagnosis not present

## 2023-03-25 DIAGNOSIS — F17200 Nicotine dependence, unspecified, uncomplicated: Secondary | ICD-10-CM | POA: Diagnosis not present

## 2023-03-25 DIAGNOSIS — R079 Chest pain, unspecified: Secondary | ICD-10-CM | POA: Diagnosis not present

## 2023-03-25 LAB — CBC WITH DIFFERENTIAL/PLATELET
Basophils Absolute: 0 10*3/uL (ref 0.0–0.2)
Basos: 0 %
EOS (ABSOLUTE): 0.1 10*3/uL (ref 0.0–0.4)
Eos: 1 %
Hematocrit: 45 % (ref 37.5–51.0)
Hemoglobin: 14.5 g/dL (ref 13.0–17.7)
Immature Grans (Abs): 0 10*3/uL (ref 0.0–0.1)
Immature Granulocytes: 0 %
Lymphocytes Absolute: 2.5 10*3/uL (ref 0.7–3.1)
Lymphs: 24 %
MCH: 27.2 pg (ref 26.6–33.0)
MCHC: 32.2 g/dL (ref 31.5–35.7)
MCV: 84 fL (ref 79–97)
Monocytes Absolute: 1.2 10*3/uL — ABNORMAL HIGH (ref 0.1–0.9)
Monocytes: 11 %
Neutrophils Absolute: 6.4 10*3/uL (ref 1.4–7.0)
Neutrophils: 64 %
Platelets: 234 10*3/uL (ref 150–450)
RBC: 5.33 x10E6/uL (ref 4.14–5.80)
RDW: 14.3 % (ref 11.6–15.4)
WBC: 10.2 10*3/uL (ref 3.4–10.8)

## 2023-03-25 LAB — CBC
HCT: 44.2 % (ref 39.0–52.0)
Hemoglobin: 14.5 g/dL (ref 13.0–17.0)
MCH: 27.6 pg (ref 26.0–34.0)
MCHC: 32.8 g/dL (ref 30.0–36.0)
MCV: 84 fL (ref 80.0–100.0)
Platelets: 200 10*3/uL (ref 150–400)
RBC: 5.26 MIL/uL (ref 4.22–5.81)
RDW: 14.7 % (ref 11.5–15.5)
WBC: 10.6 10*3/uL — ABNORMAL HIGH (ref 4.0–10.5)
nRBC: 0 % (ref 0.0–0.2)

## 2023-03-25 LAB — CMP14+EGFR
ALT: 23 IU/L (ref 0–44)
AST: 19 IU/L (ref 0–40)
Albumin/Globulin Ratio: 1.7 (ref 1.2–2.2)
Albumin: 4.3 g/dL (ref 4.1–5.1)
Alkaline Phosphatase: 115 IU/L (ref 44–121)
BUN/Creatinine Ratio: 15 (ref 9–20)
BUN: 12 mg/dL (ref 6–24)
Bilirubin Total: 0.3 mg/dL (ref 0.0–1.2)
CO2: 23 mmol/L (ref 20–29)
Calcium: 9.2 mg/dL (ref 8.7–10.2)
Chloride: 103 mmol/L (ref 96–106)
Creatinine, Ser: 0.79 mg/dL (ref 0.76–1.27)
Globulin, Total: 2.6 g/dL (ref 1.5–4.5)
Glucose: 95 mg/dL (ref 70–99)
Potassium: 4.4 mmol/L (ref 3.5–5.2)
Sodium: 139 mmol/L (ref 134–144)
Total Protein: 6.9 g/dL (ref 6.0–8.5)
eGFR: 114 mL/min/{1.73_m2} (ref >59)

## 2023-03-25 LAB — BASIC METABOLIC PANEL
Anion gap: 8 (ref 5–15)
BUN: 11 mg/dL (ref 6–20)
CO2: 25 mmol/L (ref 22–32)
Calcium: 8.7 mg/dL — ABNORMAL LOW (ref 8.9–10.3)
Chloride: 104 mmol/L (ref 98–111)
Creatinine, Ser: 0.78 mg/dL (ref 0.61–1.24)
GFR, Estimated: >60 mL/min (ref >60)
Glucose, Bld: 205 mg/dL — ABNORMAL HIGH (ref 70–99)
Potassium: 4 mmol/L (ref 3.5–5.1)
Sodium: 137 mmol/L (ref 135–145)

## 2023-03-25 LAB — D-DIMER, QUANTITATIVE: D-Dimer, Quant: <0.27 ug/mL-FEU (ref 0.00–0.50)

## 2023-03-25 LAB — HEMOGLOBIN A1C
Est. average glucose Bld gHb Est-mCnc: 123 mg/dL
Hgb A1c MFr Bld: 5.9 % — ABNORMAL HIGH (ref 4.8–5.6)

## 2023-03-25 LAB — VITAMIN D 25 HYDROXY (VIT D DEFICIENCY, FRACTURES): Vit D, 25-Hydroxy: 19.2 ng/mL — ABNORMAL LOW (ref 30.0–100.0)

## 2023-03-25 LAB — TROPONIN I (HIGH SENSITIVITY): Troponin I (High Sensitivity): 3 ng/L (ref <18)

## 2023-03-25 MED ORDER — CYCLOBENZAPRINE HCL 10 MG PO TABS
5.0000 mg | ORAL_TABLET | Freq: Once | ORAL | Status: AC
  Administered 2023-03-25: 5 mg via ORAL
  Filled 2023-03-25: qty 1

## 2023-03-25 MED ORDER — VITAMIN D (ERGOCALCIFEROL) 1.25 MG (50000 UNIT) PO CAPS: 50000.0000 [IU] | ORAL_CAPSULE | ORAL | 1 refills | Status: DC

## 2023-03-25 MED ORDER — KETOROLAC TROMETHAMINE 15 MG/ML IJ SOLN
15.0000 mg | Freq: Once | INTRAMUSCULAR | Status: AC
  Administered 2023-03-25: 15 mg via INTRAMUSCULAR
  Filled 2023-03-25: qty 1

## 2023-03-25 MED ORDER — MORPHINE SULFATE (PF) 4 MG/ML IV SOLN
4.0000 mg | Freq: Once | INTRAVENOUS | Status: AC
  Administered 2023-03-25: 4 mg via INTRAMUSCULAR
  Filled 2023-03-25: qty 1

## 2023-03-25 MED ORDER — NAPROXEN 250 MG PO TABS: 250.0000 mg | ORAL_TABLET | Freq: Two times a day (BID) | ORAL | 0 refills | Status: AC

## 2023-03-25 MED ORDER — ACETAMINOPHEN 325 MG PO TABS
650.0000 mg | ORAL_TABLET | Freq: Once | ORAL | Status: AC | PRN
  Administered 2023-03-25: 650 mg via ORAL
  Filled 2023-03-25: qty 2

## 2023-03-25 NOTE — ED Triage Notes (Signed)
Pt presents POV with complaints of Chest pain, SOB and right side pain x 1 day. Denies any n/v. Chest pain does not radiate. Denies injury. Pt took a Vicodin this morning with no relief

## 2023-03-25 NOTE — ED Provider Notes (Signed)
[facility] Provider Note    Event Date/Time   First MD Initiated Contact with Patient 03/25/23 1440     (approximate)   History   Chest Pain and Shortness of Breath   HPI  Jorge Allen is a 42 y.o. male past medical history of prediabetes, sleep apnea who presents because of chest pain and right sided flank pain.  Says the symptoms started today.  He endorses a muscle type pain in the front of his chest that is worse with movement and with deep breathing.  Pain makes it difficult for him to breathe but he denies frank dyspnea.  He also has pain in the right upper back.  He does play golf and was lifting boxes recently so thinks this could be contributing.  Tells me he thinks he is pulled a muscle.  He has had some nasal congestion postnasal drip.  Saw his primary doctor yesterday and had a negative COVID test.  He denies significant coughing denies subjective or objective fevers at home or chills.  No lower extremity swelling or pain denies urinary symptoms.  The patient denies hx of prior DVT/PE, unilateral leg pain/swelling, hormone use, recent surgery, hx of cancer, prolonged immobilization, or hemoptysis.      Past Medical History:  Diagnosis Date   Sleep apnea     Patient Active Problem List   Diagnosis Date Noted   Prediabetes 11/25/2022   Vitamin D deficiency 11/25/2022   Class 3 severe obesity due to excess calories with serious comorbidity and body mass index (BMI) of 45.0 to 49.9 in adult [facility]) 07/03/2022   Scrotal pain 05/14/2021   Sepsis (HCC) 05/14/2021   Sleep apnea 05/14/2021   OSA on CPAP 05/14/2021   NONSPEC ELEVATION OF LEVELS OF TRANSAMINASE/LDH 12/21/2007   TOBACCO USE 12/17/2007   GERD 12/17/2007     Physical Exam  Triage Vital Signs: ED Triage Vitals  Enc Vitals Group     BP 03/25/23 1236 137/89     Pulse Rate 03/25/23 1234 98     Resp 03/25/23 1234 18     Temp 03/25/23 1234 (!) 100.8 F (38.2 C)     Temp src --       SpO2 03/25/23 1234 97 %     Weight 03/25/23 1232 290 lb (131.5 kg)     Height 03/25/23 1232 5\' 5"  (1.651 m)     Head Circumference --      Peak Flow --      Pain Score 03/25/23 1231 7     Pain Loc --      Pain Edu? --      Excl. in GC? --     Most recent vital signs: Vitals:   03/25/23 1234 03/25/23 1236  BP:  137/89  Pulse: 98   Resp: 18   Temp: (!) 100.8 F (38.2 C)   SpO2: 97%      General: Awake, no distress.  CV:  Good peripheral perfusion.  Resp:  Normal effort.  Lung sounds are clear Abd:  No distention.  Abdomen is soft and nontender throughout Neuro:             Awake, Alert, Oriented x 3  Other:  No CVA tenderness, no reproducible chest wall tenderness, no midline C, T or L-spine tenderness   ED Results / Procedures / Treatments  Labs (all labs ordered are listed, but only abnormal results are displayed) Labs Reviewed  BASIC METABOLIC PANEL - Abnormal; Notable for  the following components:      Result Value   Glucose, Bld 205 (*)    Calcium 8.7 (*)    All other components within normal limits  CBC - Abnormal; Notable for the following components:   WBC 10.6 (*)    All other components within normal limits  SARS CORONAVIRUS 2 BY RT PCR  D-DIMER, QUANTITATIVE  TROPONIN I (HIGH SENSITIVITY)  TROPONIN I (HIGH SENSITIVITY)     EKG  EKG interpretation performed by myself: NSR, nml axis, nml intervals, no acute ischemic changes    RADIOLOGY I reviewed and interpreted patient's chest x-ray which shows peribronchial thickening but no focal infiltrate   PROCEDURES:  Critical Care performed: No  Procedures  The patient is on the cardiac monitor to evaluate for evidence of arrhythmia and/or significant heart rate changes.   MEDICATIONS ORDERED IN ED: Medications  acetaminophen (TYLENOL) tablet 650 mg (650 mg Oral Given 03/25/23 1239)  ketorolac (TORADOL) 15 MG/ML injection 15 mg (15 mg Intramuscular Given 03/25/23 1609)  morphine (PF) 4 MG/ML  injection 4 mg (4 mg Intramuscular Given 03/25/23 1710)  cyclobenzaprine (FLEXERIL) tablet 5 mg (5 mg Oral Given 03/25/23 1714)     IMPRESSION / MDM / ASSESSMENT AND PLAN / ED COURSE  I reviewed the triage vital signs and the nursing notes.                              Patient's presentation is most consistent with acute presentation with potential threat to life or bodily function.  Differential diagnosis includes, but is not limited to, pleurisy, pneumonia, bronchitis, viral infection, pneumothorax, PE  Patient is a 42 year old male who presents with chest and upper back pain.  This started today.  He has had some nasal congestion no significant cough.  The pain in his chest and upper back is pleuritic and worse with movement.  Denies fevers or chills.  Denies lower extremity swelling. Patient has a temp of 100.8 on arrival to triage but is not tachycardic or hypoxic.  Looks well on exam lung sounds are clear.  He is to the right mid thoracic region when asked about the location of his pain but is not reproducible on exam and he does not have midline tenderness.  Also has no CVA tenderness.  Abdominal exam is benign.  He has no signs of DVT.  Patient's EKG is nonischemic.  Chest x-ray is read as radiology as likely bronchitic changes.  Does have a mild leukocytosis to 10.6 troponin is negative.  BMP is reassuring.  I did check D-dimer given patient's low-grade temp and pleuritic pain and this is negative he is otherwise low risk for PE so I think this sufficiently rules out that diagnosis.  Patient was given IM Toradol and morphine and Flexeril for his pain.  Suspect that with his nasal congestion and bronchitic changes on chest x-ray that he likely has a viral infection.  He did not want to be tested for COVID again today.   With the atypical nature of his chest pain reassuring EKG and troponin do not feel that he needs further workup at this time.  Discussed return precautions.  He is appropriate  for discharge.    Clinical Course as of 03/25/23 1736  Wed Mar 25, 2023  1649 D-Dimer, Quant: <0.27 [KM]    Clinical Course User Index [KM] Georga Hacking, MD     FINAL CLINICAL IMPRESSION(S) / ED DIAGNOSES  Final diagnoses:  Chest pain, unspecified type     Rx / DC Orders   ED Discharge Orders     None        Note:  This document was prepared using Dragon voice recognition software and may include unintentional dictation errors.   Georga Hacking, MD 03/25/23 [phone]

## 2023-03-25 NOTE — Discharge Instructions (Addendum)
You did have a fever today and I suspect that you have a viral infection causing your nasal discharge.  Workup for your chest pain was reassuring.  You can take Tylenol and Motrin for pain.  If your chest pain is worsening or you are developing more shortness of breath and please return to the emergency department.

## 2023-03-27 ENCOUNTER — Telehealth: Payer: Self-pay

## 2023-03-27 NOTE — Transitions of Care (Post Inpatient/ED Visit) (Signed)
03/27/2023  Name: Jorge Allen MRN: [id number] DOB: 09-Mar-1981  Today's TOC FU Call Status: Today's TOC FU Call Status:: Successful TOC FU Call Competed TOC FU Call Complete Date: 03/27/23  Transition Care Management Follow-up Telephone Call Date of Discharge: 03/25/23 Discharge Facility: [facility]) Type of Discharge: Inpatient Admission Primary Inpatient Discharge Diagnosis:: Chest pain How have you been since you were released from the hospital?: Better Any questions or concerns?: No  Items Reviewed: Did you receive and understand the discharge instructions provided?: Yes Medications obtained,verified, and reconciled?: Yes (Medications Reviewed) Any new allergies since your discharge?: No Dietary orders reviewed?: No Do you have support at home?: Yes People in Home: spouse  Medications Reviewed Today: Medications Reviewed Today     Reviewed by Marlyn Corporal, CMA (Certified Medical Assistant) on 03/27/23 at 1543  Med List Status: <None>   Medication Order Taking? Sig Documenting Provider Last Dose Status Informant  benzonatate (TESSALON) 100 MG capsule [id number] Yes Take 100 mg by mouth 3 (three) times daily as needed for cough. [provider] Taking Active   cetirizine (ZYRTEC ALLERGY) 10 MG tablet [id number] Yes Take 1 tablet (10 mg total) by mouth daily. Arnette Felts, FNP Taking Active   cyclobenzaprine (FLEXERIL) 10 MG tablet [id number] Yes Take 1 tablet (10 mg total) by mouth 3 (three) times daily as needed for muscle spasms. Arnette Felts, FNP Taking Active   dicyclomine (BENTYL) 10 MG capsule [id number] Yes Take 1 capsule (10 mg total) by mouth every 8 (eight) hours as needed (abdominal cramping). Dionne Bucy, MD Taking Active   hydrOXYzine (ATARAX) 10 MG tablet [id number] Yes Take 1 tablet (10 mg total) by mouth 3 (three) times daily as needed. Arnette Felts, FNP Taking Active   metFORMIN (GLUCOPHAGE) 500 MG tablet [id number]  Yes Take 1 tablet (500 mg total) by mouth daily with breakfast. Arnette Felts, FNP Taking Active   MOBIC 15 MG tablet [id number] Yes Take 1 tablet (15 mg total) by mouth daily. Arnette Felts, FNP Taking Active   mometasone (NASONEX) 50 MCG/ACT nasal spray [id number] Yes Place 2 sprays into the nose daily. Arnette Felts, FNP Taking Active   naproxen (NAPROSYN) 250 MG tablet [id number] Yes Take 1 tablet (250 mg total) by mouth 2 (two) times daily with a meal for 10 days. Georga Hacking, MD Taking Active   omeprazole (PRILOSEC OTC) 20 MG tablet [id number] Yes Take 1 tablet (20 mg total) by mouth daily. Arnette Felts, FNP Taking Active   Semaglutide-Weight Management [facility]) 0.5 MG/0.5ML Ivory Broad [id number] Yes Inject 0.5 mg into the skin once a week. Arnette Felts, FNP Taking Active   Vitamin D, Ergocalciferol, (DRISDOL) 1.25 MG (50000 UNIT) CAPS capsule [id number] Yes Take 1 capsule (50,000 Units total) by mouth 2 (two) times a week. Arnette Felts, FNP Taking Active             Home Care and Equipment/Supplies: Were Home Health Services Ordered?: No Any new equipment or medical supplies ordered?: No  Functional Questionnaire: Do you need assistance with bathing/showering or dressing?: No Do you need assistance with meal preparation?: No Do you need assistance with eating?: No Do you have difficulty maintaining continence: No Do you need assistance with getting out of bed/getting out of a chair/moving?: No Do you have difficulty managing or taking your medications?: No  Follow up appointments reviewed: PCP Follow-up appointment confirmed?: No Specialist Hospital Follow-up appointment confirmed?: No Do you need transportation to your follow-up appointment?: No Do you understand  care options if your condition(s) worsen?: Yes-patient verbalized understanding    SIGNATURE Lisabeth Devoid, CMA

## 2023-03-31 ENCOUNTER — Inpatient Hospital Stay: Payer: Self-pay | Admitting: Family Medicine

## 2023-05-18 ENCOUNTER — Other Ambulatory Visit: Payer: Self-pay

## 2023-05-19 ENCOUNTER — Ambulatory Visit (INDEPENDENT_AMBULATORY_CARE_PROVIDER_SITE_OTHER): Payer: 59 | Admitting: Dermatology

## 2023-05-19 ENCOUNTER — Encounter: Payer: Self-pay | Admitting: Dermatology

## 2023-05-19 DIAGNOSIS — L732 Hidradenitis suppurativa: Secondary | ICD-10-CM

## 2023-05-19 DIAGNOSIS — L918 Other hypertrophic disorders of the skin: Secondary | ICD-10-CM | POA: Diagnosis not present

## 2023-05-19 MED ORDER — DOXYCYCLINE MONOHYDRATE 100 MG PO CAPS: 100.0000 mg | ORAL_CAPSULE | Freq: Two times a day (BID) | ORAL | 0 refills | Status: DC

## 2023-05-19 MED ORDER — CLINDAMYCIN PHOSPHATE 1 % EX SOLN: Freq: Every day | CUTANEOUS | 2 refills | Status: DC

## 2023-05-19 NOTE — Patient Instructions (Addendum)
Recommend benzoyl-peroxide wash daily to affected areas.  Start clindamycin to affected areas once daily.  As needed for flares may start doxycycline monohydrate 100 mg twice daily with food for 1 week.   Doxycycline should be taken with food to prevent nausea. Do not lay down for 30 minutes after taking. Be cautious with sun exposure and use good sun protection while on this medication. Pregnant women should not take this medication.    Due to recent changes in healthcare laws, you may see results of your pathology and/or laboratory studies on MyChart before the doctors have had a chance to review them. We understand that in some cases there may be results that are confusing or concerning to you. Please understand that not all results are received at the same time and often the doctors may need to interpret multiple results in order to provide you with the best plan of care or course of treatment. Therefore, we ask that you please give [place] 2 business days to thoroughly review all your results before contacting the office for clarification. Should we see a critical lab result, you will be contacted sooner.   If You Need Anything After Your Visit  If you have any questions or concerns for your doctor, please call our main line at [phone] If no one answers, please leave a voicemail as directed and we will return your call as soon as possible. Messages left after 4 pm will be answered the following business day.   You may also send [place] a message via MyChart. We typically respond to MyChart messages within 1-2 business days.  For prescription refills, please ask your pharmacy to contact our office. Our fax number is [phone].  If you have an urgent issue when the clinic is closed that cannot wait until the next business day, you can page your doctor at the number below.    Please note that while we do our best to be available for urgent issues outside of office hours, we are not available  24/7.   If you have an urgent issue and are unable to reach [place], you may choose to seek medical care at your doctor's office, retail clinic, urgent care center, or emergency room.  If you have a medical emergency, please immediately call 911 or go to the emergency department. In the event of inclement weather, please call our main line at [phone] for an update on the status of any delays or closures.  Dermatology Medication Tips: Please keep the boxes that topical medications come in in order to help keep track of the instructions about where and how to use these. Pharmacies typically print the medication instructions only on the boxes and not directly on the medication tubes.   If your medication is too expensive, please contact our office at [phone] or send [place] a message through MyChart.   We are unable to tell what your co-pay for medications will be in advance as this is different depending on your insurance coverage. However, we may be able to find a substitute medication at lower cost or fill out paperwork to get insurance to cover a needed medication.   If a prior authorization is required to get your medication covered by your insurance company, please allow [place] 1-2 business days to complete this process.  Drug prices often vary depending on where the prescription is filled and some pharmacies may offer cheaper prices.  The website www.goodrx.com contains coupons for medications through different pharmacies. The prices here do  not account for what the cost may be with help from insurance (it may be cheaper with your insurance), but the website can give you the price if you did not use any insurance.  - You can print the associated coupon and take it with your prescription to the pharmacy.  - You may also stop by our office during regular business hours and pick up a GoodRx coupon card.  - If you need your prescription sent electronically to a different pharmacy, notify our office  through [facility] or by phone at [phone]

## 2023-05-19 NOTE — Progress Notes (Signed)
New Patient Visit   Subjective  Jorge Allen is a 42 y.o. male who presents for the following: skin tags around neck.   Patient also with a hx of HS at axilla and groin. He has taken doxycycline 100 mg twice daily for 3 months and has had an abscess removed at scrotum.  Painful calluses at feet as well as plantars wart. Podiatrist recommended he see dermatology.   The patient has spots, moles and lesions to be evaluated, some may be new or changing and the patient may have concern these could be cancer.   The following portions of the chart were reviewed this encounter and updated as appropriate: medications, allergies, medical history  Review of Systems:  No other skin or systemic complaints except as noted in HPI or Assessment and Plan.  Objective  Well appearing patient in no apparent distress; mood and affect are within normal limits.   A focused examination was performed of the following areas: Axilla, neck  Relevant exam findings are noted in the Assessment and Plan.    Assessment & Plan   Acrochordons (Skin Tags) - Fleshy, skin-colored pedunculated papules - Benign appearing.  - Observe. - If desired, they can be removed with an in office procedure that is not covered by insurance. - Please call the clinic if you notice any new or changing lesions. - Patient advised removal is cosmetic. Will schedule to come back for removal.   HIDRADENITIS SUPPURATIVA  Chronic and persistent condition with duration or expected duration over one year. Condition is symptomatic/ bothersome to patient. Not currently at goal.   Hidradenitis Suppurativa is a chronic; persistent; non-curable, but treatable condition due to abnormal inflamed sweat glands in the body folds (axilla, inframammary, groin, medial thighs), causing recurrent painful draining cysts and scarring. It can be associated with severe scarring acne and cysts; also abscesses and scarring of scalp. The goal is  control and prevention of flares, as it is not curable. Scars are permanent and can be thickened. Treatment may include daily use of topical medication and oral antibiotics.  Oral isotretinoin may also be helpful.  For some cases, Humira or Cosentyx (biologic injections) may be prescribed to decrease the inflammatory process and prevent flares.  When indicated, inflamed cysts may also be treated surgically.  Treatment Plan: Recommend benzoyl-peroxide wash daily to affected areas.  Start clindamycin to affected areas once daily.  As needed for flares may start doxycycline monohydrate 100 mg twice daily with food for 1 week.   Doxycycline should be taken with food to prevent nausea. Do not lay down for 30 minutes after taking. Be cautious with sun exposure and use good sun protection while on this medication. Pregnant women should not take this medication.    Procedure Note Intralesional Injection  Location: right axilla  Informed Consent: Discussed risks (infection, pain, bleeding, bruising, thinning of the skin, loss of skin pigment, lack of resolution, and recurrence of lesion) and benefits of the procedure, as well as the alternatives. Informed consent was obtained. Preparation: The area was prepared a standard fashion.  Anesthesia: none  Procedure Details: An intralesional injection was performed with Kenalog 10 mg/cc. Marland Kitchen01 cc in total were injected. NDC #: 1610-9604-54 Exp: 11/2024  Total number of injections: 1  Plan: The patient was instructed on post-op care. Recommend OTC analgesia as needed for pain.    Return in about 2 months (around 07/20/2023) for Hidradenitis follow up.  Anise Salvo, RMA, am acting as scribe for [employer],  DO .   Documentation: I have reviewed the above documentation for accuracy and completeness, and I agree with the above.  Langston Reusing, DO

## 2023-05-20 DIAGNOSIS — F4320 Adjustment disorder, unspecified: Secondary | ICD-10-CM | POA: Diagnosis not present

## 2023-05-25 ENCOUNTER — Telehealth: Payer: Self-pay

## 2023-05-25 NOTE — Telephone Encounter (Signed)
Patients perception for CPAP was faxed to [facility] 05/25/2023

## 2023-06-03 ENCOUNTER — Telehealth (INDEPENDENT_AMBULATORY_CARE_PROVIDER_SITE_OTHER): Payer: 59 | Admitting: Nurse Practitioner

## 2023-06-03 ENCOUNTER — Encounter: Payer: Self-pay | Admitting: Nurse Practitioner

## 2023-06-03 DIAGNOSIS — Z9989 Dependence on other enabling machines and devices: Secondary | ICD-10-CM | POA: Diagnosis not present

## 2023-06-03 DIAGNOSIS — Z6841 Body Mass Index (BMI) 40.0 and over, adult: Secondary | ICD-10-CM

## 2023-06-03 DIAGNOSIS — G4733 Obstructive sleep apnea (adult) (pediatric): Secondary | ICD-10-CM

## 2023-06-03 NOTE — Assessment & Plan Note (Signed)
he is encouraged to strive for BMI less than 30 to decrease cardiac risk. Goal to perform at least 150 minutes of exercise per week.

## 2023-06-03 NOTE — Progress Notes (Signed)
Virtual Visit via Mychar   This visit type was conducted due to national recommendations for restrictions regarding the COVID-19 Pandemic (e.g. social distancing) in an effort to limit this patient's exposure and mitigate transmission in our community.  Due to his co-morbid illnesses, this patient is at least at moderate risk for complications without adequate follow up.  This format is felt to be most appropriate for this patient at this time.  All issues noted in this document were discussed and addressed.  A limited physical exam was performed with this format.    This visit type was conducted due to national recommendations for restrictions regarding the COVID-19 Pandemic (e.g. social distancing) in an effort to limit this patient's exposure and mitigate transmission in our community.  Patients identity confirmed using two different identifiers.  This format is felt to be most appropriate for this patient at this time.  All issues noted in this document were discussed and addressed.  No physical exam was performed (except for noted visual exam findings with Video Visits).    Date:  06/03/2023   ID:  Jorge Allen, DOB [birth year], MRN [id number]  Patient Location:  Home - Spoke with Mendel Ryder  Provider location:   Office    Chief Complaint:  needs new CPAP machine  History of Present Illness:    Jorge Allen is a 42 y.o. male who presents via video conferencing for a telehealth visit today.    The patient does not have symptoms concerning for COVID-19 infection (fever, chills, cough, or new shortness of breath).   Patient presents virtual today for a follow up on his CPAP machine. Patient reports his CPAP has stopped working for about 3-4 weeks, he reports using the cpap daily until breaking. He did take it in to [facility]. He has had a CPAP since 2016. He had a sleep study done in [facility]. He is at 15 for his settings. He has a pillow, but he wants to switch to the mask  (N30). The pillow irritates his nose causing pain. Reports he wears nightly and helps with his quality of life. He can tell a difference when he does not use, he will feel sluggish and not well rested. He will wake up "stuffy" in the morning.      Past Medical History:  Diagnosis Date   Sleep apnea    Past Surgical History:  Procedure Laterality Date   FOOT SURGERY     INCISION AND DRAINAGE ABSCESS Right 05/14/2021   Procedure: INCISION AND DRAINAGE ABSCESS;  Surgeon: Riki Altes, MD;  Location: [facility] ORS;  Service: Urology;  Laterality: Right;   TONSILLECTOMY       Current Meds  Medication Sig   benzonatate (TESSALON) 100 MG capsule Take 100 mg by mouth 3 (three) times daily as needed for cough.   cetirizine (ZYRTEC ALLERGY) 10 MG tablet Take 1 tablet (10 mg total) by mouth daily.   clindamycin (CLEOCIN T) 1 % external solution Apply topically daily.   cyclobenzaprine (FLEXERIL) 10 MG tablet Take 1 tablet (10 mg total) by mouth 3 (three) times daily as needed for muscle spasms.   doxycycline (MONODOX) 100 MG capsule Take 1 capsule (100 mg total) by mouth 2 (two) times daily. Take twice daily for up to 1 week as needed for flares.   hydrOXYzine (ATARAX) 10 MG tablet Take 1 tablet (10 mg total) by mouth 3 (three) times daily as needed.   metFORMIN (GLUCOPHAGE) 500 MG tablet Take 1  tablet (500 mg total) by mouth daily with breakfast.   MOBIC 15 MG tablet Take 1 tablet (15 mg total) by mouth daily.   omeprazole (PRILOSEC OTC) 20 MG tablet Take 1 tablet (20 mg total) by mouth daily.     Allergies:   Patient has no known allergies.   Social History   Tobacco Use   Smoking status: Every Day    Current packs/day: 0.50    Average packs/day: 0.5 packs/day for 48.0 years (24.0 ttl pk-yrs)    Types: Cigarettes   Smokeless tobacco: Never  Vaping Use   Vaping status: Every Day   Start date: 10/17/2021   Substances: Nicotine   Devices: Vuse  Substance Use Topics   Alcohol use: Yes     Alcohol/week: 10.0 standard drinks of alcohol    Types: 10 Shots of liquor per week    Comment: half gallon liquor weekly   Drug use: No     Family Hx: The patient's family history includes Cancer in his father, maternal grandfather, and mother; Dementia in his maternal grandmother and mother; Diabetes in his mother; Glaucoma in his father; Hypertension in his father and paternal grandmother.  ROS:   Please see the history of present illness.    Review of Systems  Constitutional:  Positive for malaise/fatigue.  Respiratory: Negative.    Cardiovascular: Negative.   Musculoskeletal: Negative.   Neurological: Negative.   Psychiatric/Behavioral: Negative.      All other systems reviewed and are negative.   Labs/Other Tests and Data Reviewed:    Recent Labs: 03/24/2023: ALT 23 03/25/2023: BUN 11; Creatinine, Ser 0.78; Hemoglobin 14.5; Platelets 200; Potassium 4.0; Sodium 137   Recent Lipid Panel Lab Results  Component Value Date/Time   CHOL 152 11/25/2022 11:48 AM   TRIG 91 11/25/2022 11:48 AM   HDL 38 (L) 11/25/2022 11:48 AM   CHOLHDL 4.0 11/25/2022 11:48 AM   CHOLHDL 6.6 CALC 12/17/2007 03:23 PM   LDLCALC 97 11/25/2022 11:48 AM    Wt Readings from Last 3 Encounters:  03/25/23 290 lb (131.5 kg)  03/24/23 291 lb 12.8 oz (132.4 kg)  12/10/22 290 lb (131.5 kg)     Exam:    Vital Signs:  There were no vitals taken for this visit.    Physical Exam Vitals reviewed.  Constitutional:      General: He is not in acute distress.    Appearance: Normal appearance.  Pulmonary:     Effort: Pulmonary effort is normal. No respiratory distress.  Neurological:     General: No focal deficit present.     Mental Status: He is alert and oriented to person, place, and time. Mental status is at baseline.     Cranial Nerves: No cranial nerve deficit.  Psychiatric:        Mood and Affect: Mood and affect normal.        Behavior: Behavior normal.        Thought Content: Thought  content normal.        Cognition and Memory: Memory normal.        Judgment: Judgment normal.     ASSESSMENT & PLAN:    OSA on CPAP Assessment & Plan: Has had since 2016, most recent settings noted were CPAP @ 15 cm of water, most recent weight 290 lbs. He benefits from using his CPAP nightly and has a good quality of life, he would like to try the mask (N30) due to irritation to nose with nasal pillow.  Morbid obesity with BMI of 45.0-49.9, adult [facility]) Assessment & Plan: he is encouraged to strive for BMI less than 30 to decrease cardiac risk. Goal to perform at least 150 minutes of exercise per week.       COVID-19 Education: The signs and symptoms of COVID-19 were discussed with the patient and how to seek care for testing (follow up with PCP or arrange E-visit).  The importance of social distancing was discussed today.  Patient Risk:   After full review of this patients clinical status, I feel that they are at least moderate risk at this time.  Time:   Today, I have spent 5 minutes/ seconds with the patient with telehealth technology discussing above diagnoses.     Medication Adjustments/Labs and Tests Ordered: Current medicines are reviewed at length with the patient today.  Concerns regarding medicines are outlined above.   Tests Ordered: No orders of the defined types were placed in this encounter.   Medication Changes: No orders of the defined types were placed in this encounter.   Disposition:  Follow up prn  Signed, Arnette Felts, FNP

## 2023-06-03 NOTE — Assessment & Plan Note (Signed)
Has had since 2016, most recent settings noted were CPAP @ 15 cm of water, most recent weight 290 lbs. He benefits from using his CPAP nightly and has a good quality of life, he would like to try the mask (N30) due to irritation to nose with nasal pillow.

## 2023-06-16 DIAGNOSIS — G4733 Obstructive sleep apnea (adult) (pediatric): Secondary | ICD-10-CM | POA: Diagnosis not present

## 2023-07-01 ENCOUNTER — Other Ambulatory Visit: Payer: Self-pay

## 2023-07-01 ENCOUNTER — Ambulatory Visit
Admission: EM | Admit: 2023-07-01 | Discharge: 2023-07-01 | Disposition: A | Discharge status: Home / Self Care | Payer: 59 | Attending: Internal Medicine | Admitting: Internal Medicine

## 2023-07-01 ENCOUNTER — Encounter: Payer: Self-pay | Admitting: *Deleted

## 2023-07-01 DIAGNOSIS — R051 Acute cough: Secondary | ICD-10-CM | POA: Diagnosis not present

## 2023-07-01 DIAGNOSIS — Z20818 Contact with and (suspected) exposure to other bacterial communicable diseases: Secondary | ICD-10-CM

## 2023-07-01 DIAGNOSIS — L02416 Cutaneous abscess of left lower limb: Secondary | ICD-10-CM | POA: Diagnosis not present

## 2023-07-01 DIAGNOSIS — J069 Acute upper respiratory infection, unspecified: Secondary | ICD-10-CM | POA: Diagnosis not present

## 2023-07-01 LAB — POCT RAPID STREP A (OFFICE): Rapid Strep A Screen: NEGATIVE

## 2023-07-01 MED ORDER — PROMETHAZINE-DM 6.25-15 MG/5ML PO SYRP: 5.0000 mL | ORAL_SOLUTION | Freq: Four times a day (QID) | ORAL | 0 refills | Status: DC | PRN

## 2023-07-01 MED ORDER — DOXYCYCLINE HYCLATE 100 MG PO CAPS: 100.0000 mg | ORAL_CAPSULE | Freq: Two times a day (BID) | ORAL | 0 refills | Status: AC

## 2023-07-01 NOTE — Discharge Instructions (Signed)
I have prescribed an additional 6 days of doxycycline antibiotic which will treat both upper respiratory infection and abscess.  Continue warm compresses to abscess.  Follow-up with the dermatologist or urgent care if abscess persists or worsens.  Cough medication has been prescribed.  Please be advised that it can make you drowsy

## 2023-07-01 NOTE — ED Provider Notes (Signed)
[facility]    CSN: [id number] Arrival date & time: 07/01/23  1549      History   Chief Complaint Chief Complaint  Patient presents with   Cough    HPI Jorge Allen is a 42 y.o. male.   Patient presents with multiple different chief complaints today.  Reports that he has a cough and nasal congestion for approximately 1 week.  Wife recently tested positive for strep throat.  Denies any fever.  Patient has taken Zyrtec and a nasal spray with minimal improvement.  Denies history of asthma or COPD but patient does smoke cigarettes.  Also reporting abscess to left inner thigh that started a few days ago.  Reports he has a history of hidradenitis.  He does have doxycycline as needed which he has been taking for approximately 4 days but reports that he does not really take it as prescribed.  Denies any drainage from the area or any associated fever.   Cough   Past Medical History:  Diagnosis Date   Sleep apnea     Patient Active Problem List   Diagnosis Date Noted   Prediabetes 11/25/2022   Vitamin D deficiency 11/25/2022   Morbid obesity with BMI of 45.0-49.9, adult (HCC) 07/03/2022   Scrotal pain 05/14/2021   Sepsis (HCC) 05/14/2021   Sleep apnea 05/14/2021   OSA on CPAP 05/14/2021   NONSPEC ELEVATION OF LEVELS OF TRANSAMINASE/LDH 12/21/2007   TOBACCO USE 12/17/2007   GERD 12/17/2007    Past Surgical History:  Procedure Laterality Date   FOOT SURGERY     INCISION AND DRAINAGE ABSCESS Right 05/14/2021   Procedure: INCISION AND DRAINAGE ABSCESS;  Surgeon: Riki Altes, MD;  Location: [facility] ORS;  Service: Urology;  Laterality: Right;   TONSILLECTOMY         Home Medications    Prior to Admission medications   Medication Sig Start Date End Date Taking? Authorizing Provider  benzonatate (TESSALON) 100 MG capsule Take 100 mg by mouth 3 (three) times daily as needed for cough.   Yes [provider]  cetirizine (ZYRTEC ALLERGY) 10 MG tablet  Take 1 tablet (10 mg total) by mouth daily. 03/24/23 03/23/24 Yes Arnette Felts, FNP  doxycycline (VIBRAMYCIN) 100 MG capsule Take 1 capsule (100 mg total) by mouth 2 (two) times daily for 6 days. 07/01/23 07/07/23 Yes Bennie Scaff, Acie Fredrickson, FNP  metFORMIN (GLUCOPHAGE) 500 MG tablet Take 1 tablet (500 mg total) by mouth daily with breakfast. 11/25/22  Yes Arnette Felts, FNP  MOBIC 15 MG tablet Take 1 tablet (15 mg total) by mouth daily. 08/14/22  Yes Arnette Felts, FNP  omeprazole (PRILOSEC OTC) 20 MG tablet Take 1 tablet (20 mg total) by mouth daily. 11/25/22  Yes Arnette Felts, FNP  promethazine-dextromethorphan (PROMETHAZINE-DM) 6.25-15 MG/5ML syrup Take 5 mLs by mouth every 6 (six) hours as needed for cough. 07/01/23  Yes Cynde Menard, Rolly Salter E, FNP  clindamycin (CLEOCIN T) 1 % external solution Apply topically daily. 05/19/23 05/18/24  Terri Piedra, DO  cyclobenzaprine (FLEXERIL) 10 MG tablet Take 1 tablet (10 mg total) by mouth 3 (three) times daily as needed for muscle spasms. 08/14/22   Arnette Felts, FNP  dicyclomine (BENTYL) 10 MG capsule Take 1 capsule (10 mg total) by mouth every 8 (eight) hours as needed (abdominal cramping). Patient not taking: Reported on 05/19/2023 12/10/22   Dionne Bucy, MD  doxycycline (MONODOX) 100 MG capsule Take 1 capsule (100 mg total) by mouth 2 (two) times daily. Take twice  daily for up to 1 week as needed for flares. 05/19/23   Terri Piedra, DO  hydrOXYzine (ATARAX) 10 MG tablet Take 1 tablet (10 mg total) by mouth 3 (three) times daily as needed. 08/14/22   Arnette Felts, FNP  mometasone (NASONEX) 50 MCG/ACT nasal spray Place 2 sprays into the nose daily. Patient not taking: Reported on 05/19/2023 03/24/23 03/23/24  Arnette Felts, FNP  Vitamin D, Ergocalciferol, (DRISDOL) 1.25 MG (50000 UNIT) CAPS capsule Take 1 capsule (50,000 Units total) by mouth 2 (two) times a week. Patient not taking: Reported on 05/19/2023 03/26/23   Arnette Felts, FNP    Family History Family History   Problem Relation Age of Onset   Diabetes Mother    Cancer Mother    Dementia Mother    Hypertension Father    Cancer Father    Glaucoma Father    Dementia Maternal Grandmother    Cancer Maternal Grandfather    Hypertension Paternal Grandmother     Social History Social History   Tobacco Use   Smoking status: Every Day    Current packs/day: 0.50    Average packs/day: 0.5 packs/day for 48.0 years (24.0 ttl pk-yrs)    Types: Cigarettes   Smokeless tobacco: Never  Vaping Use   Vaping status: Every Day   Start date: 10/17/2021   Substances: Nicotine   Devices: Vuse  Substance Use Topics   Alcohol use: Yes    Comment: 2-3 days week   Drug use: No     Allergies   Patient has no known allergies.   Review of Systems Review of Systems Per HPI  Physical Exam Triage Vital Signs ED Triage Vitals  Encounter Vitals Group     BP 07/01/23 1655 122/77     Systolic BP Percentile --      Diastolic BP Percentile --      Pulse Rate 07/01/23 1655 88     Resp 07/01/23 1655 18     Temp 07/01/23 1655 98.4 F (36.9 C)     Temp Source 07/01/23 1655 Oral     SpO2 07/01/23 1655 97 %     Weight --      Height --      Head Circumference --      Peak Flow --      Pain Score 07/01/23 1651 7     Pain Loc --      Pain Education --      Exclude from Growth Chart --    No data found.  Updated Vital Signs BP 122/77 (BP Location: Left Arm)   Pulse 88   Temp 98.4 F (36.9 C) (Oral)   Resp 18   SpO2 97%   Visual Acuity Right Eye Distance:   Left Eye Distance:   Bilateral Distance:    Right Eye Near:   Left Eye Near:    Bilateral Near:     Physical Exam Constitutional:      General: He is not in acute distress.    Appearance: Normal appearance. He is not toxic-appearing or diaphoretic.  HENT:     Head: Normocephalic and atraumatic.     Right Ear: Tympanic membrane and ear canal normal.     Left Ear: Tympanic membrane and ear canal normal.     Nose: Congestion present.      Mouth/Throat:     Mouth: Mucous membranes are moist.     Pharynx: Posterior oropharyngeal erythema present.  Eyes:     Extraocular Movements: Extraocular  movements intact.     Conjunctiva/sclera: Conjunctivae normal.     Pupils: Pupils are equal, round, and reactive to light.  Cardiovascular:     Rate and Rhythm: Normal rate and regular rhythm.     Pulses: Normal pulses.     Heart sounds: Normal heart sounds.  Pulmonary:     Effort: Pulmonary effort is normal. No respiratory distress.     Breath sounds: Normal breath sounds. No stridor. No wheezing, rhonchi or rales.  Abdominal:     General: Abdomen is flat. Bowel sounds are normal.     Palpations: Abdomen is soft.  Musculoskeletal:        General: Normal range of motion.     Cervical back: Normal range of motion.  Skin:    General: Skin is warm and dry.     Comments: Approximately 2 inch x 3 inch in diameter very indurated erythematous abscess with no drainage that is flat present to left inner thigh.  Neurological:     General: No focal deficit present.     Mental Status: He is alert and oriented to person, place, and time. Mental status is at baseline.  Psychiatric:        Mood and Affect: Mood normal.        Behavior: Behavior normal.      UC Treatments / Results  Labs (all labs ordered are listed, but only abnormal results are displayed) Labs Reviewed  CULTURE, GROUP A STREP [facility])  POCT RAPID STREP A (OFFICE)    EKG   Radiology No results found.  Procedures Procedures (including critical care time)  Medications Ordered in UC Medications - No data to display  Initial Impression / Assessment and Plan / UC Course  I have reviewed the triage vital signs and the nursing notes.  Pertinent labs & imaging results that were available during my care of the patient were reviewed by me and considered in my medical decision making (see chart for details).     1.  Acute upper respiratory  infection  Doxycycline prescribed to help alleviate upper respiratory infection as this will also be helpful with abscess.  Patient reports he has already taken 4 days of doxycycline so will send an additional 6 days as patient reports that he does not have enough left.  Patient requesting cough medication that was previously prescribed which appears to be Promethazine DM so will refill this.  Patient advised that this can make him drowsy.  There are no adventitious lung sounds on exam so do not think that chest imaging is necessary.  Viral testing deferred given duration of symptoms as it would not change treatment.  Rapid strep completed given exposure which is negative.  Throat culture pending.  2.  Abscess of thigh  I&D not conducive at this time as abscess is very indurated and flat.  Patient to continue doxycycline and advised to continue warm compresses. Additional 6 days of doxycycline prescribed as patient states that he does not have enough at home.  Patient advised to follow-up with established dermatologist for further evaluation and management and was given strict return and ER precautions if symptoms persist or worsen.  Patient verbalized understanding and was agreeable with plan. Final Clinical Impressions(s) / UC Diagnoses   Final diagnoses:  Acute upper respiratory infection  Acute cough  Abscess of left thigh  Exposure to strep throat     Discharge Instructions      I have prescribed an additional 6 days of doxycycline antibiotic  which will treat both upper respiratory infection and abscess.  Continue warm compresses to abscess.  Follow-up with the dermatologist or urgent care if abscess persists or worsens.  Cough medication has been prescribed.  Please be advised that it can make you drowsy     ED Prescriptions     Medication Sig Dispense Auth. Provider   doxycycline (VIBRAMYCIN) 100 MG capsule Take 1 capsule (100 mg total) by mouth 2 (two) times daily for 6 days. 12  capsule [place], [place], [place]   promethazine-dextromethorphan (PROMETHAZINE-DM) 6.25-15 MG/5ML syrup Take 5 mLs by mouth every 6 (six) hours as needed for cough. 118 mL Gustavus Bryant, [place]      PDMP not reviewed this encounter.   Gustavus Bryant, [place] 07/01/23 1725

## 2023-07-01 NOTE — ED Triage Notes (Signed)
Cough x 1 week and  nasal drainage/congestion x 1 week. Also reports he has a boil on the inside of his left leg x 1 week. Hx of HS

## 2023-07-04 LAB — CULTURE, GROUP A STREP (THRC)

## 2023-07-06 IMAGING — CT CT ABD-PELV W/ CM
2 of 5 series · 15 of 46 positions shown, 17 images · IV contrast (omnipaque)
Comparison: Scrotal sonogram of the same date.

CLINICAL DATA: RIGHT lower quadrant abdominal pain, RIGHT-sided
scrotal edema and pain radiating into the RIGHT abdomen.

EXAM:
CT ABDOMEN AND PELVIS WITH CONTRAST
TECHNIQUE: Multidetector CT imaging of the abdomen and pelvis was performed
using the standard protocol following bolus administration of
intravenous contrast.
CONTRAST:  125mL OMNIPAQUE IOHEXOL 300 MG/ML  SOLN

[Series 2: routine abd/pel with · axial · 0.98mm/px · z∈[-482,+68]mm · 12 of 122 slices shown, 14 images]
[im 6/122  soft-tissue]
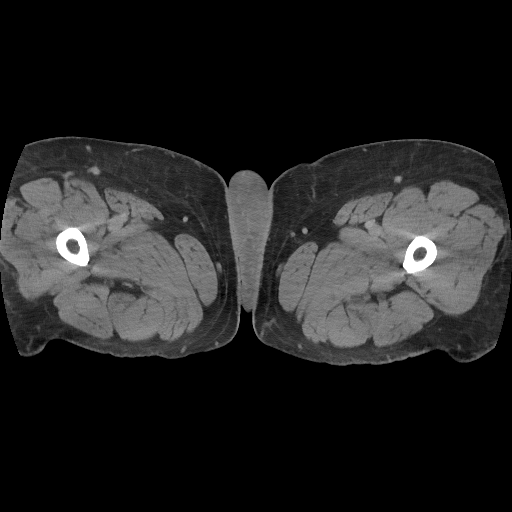
[im 6/122  bone]
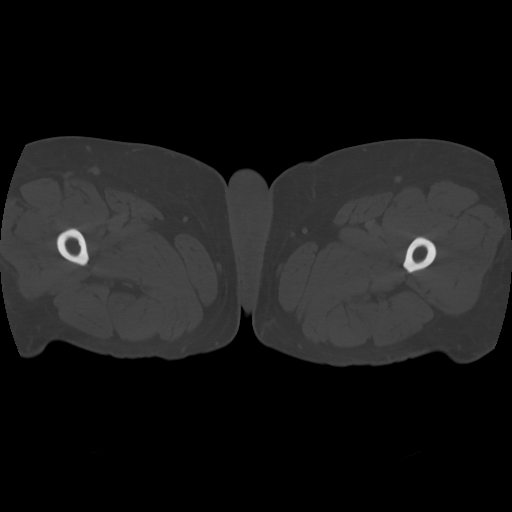
[im 17/122  soft-tissue]
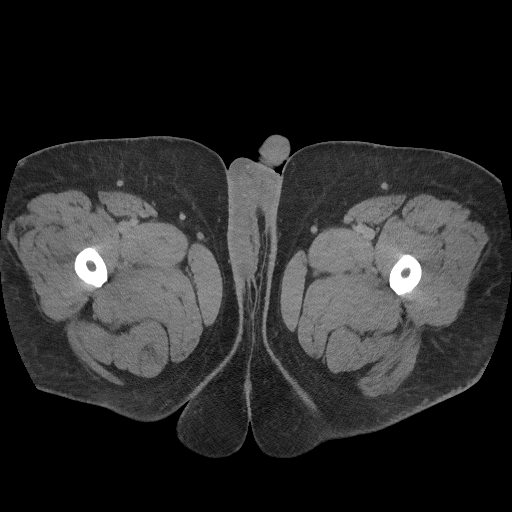
[im 28/122  soft-tissue]
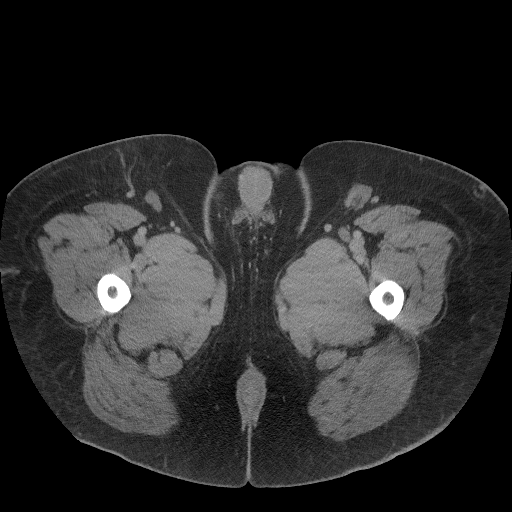
[im 39/122  soft-tissue]
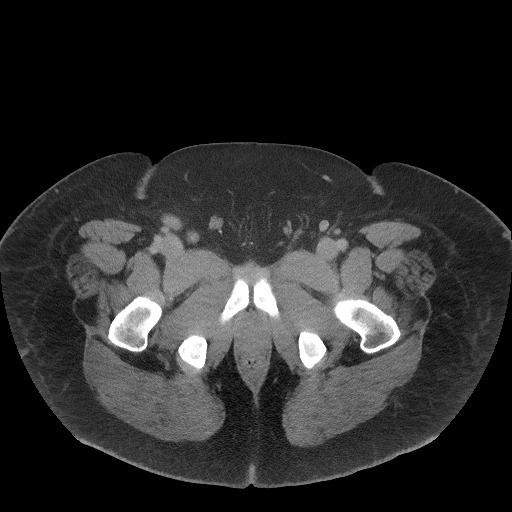
[im 45/122  soft-tissue]
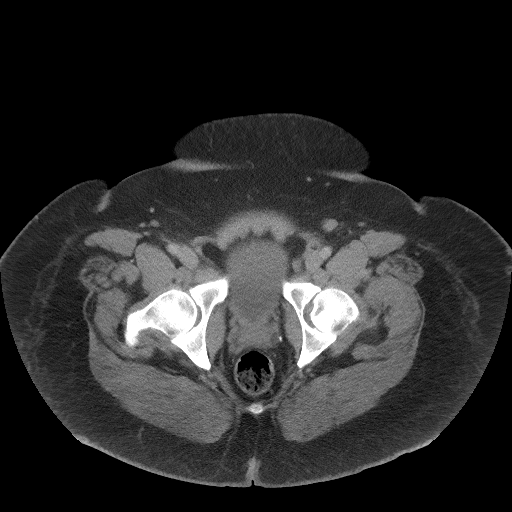
[im 56/122  soft-tissue]
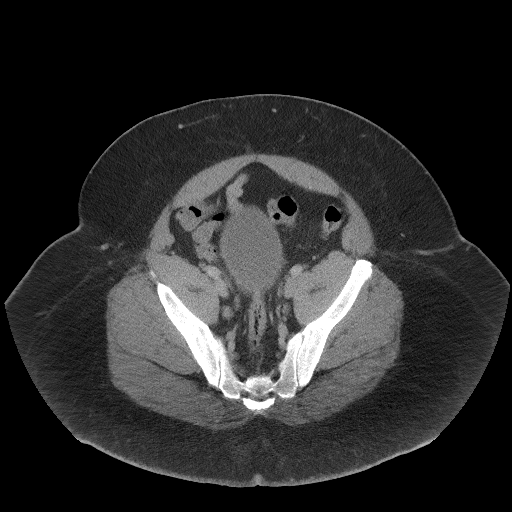
[im 67/122  soft-tissue]
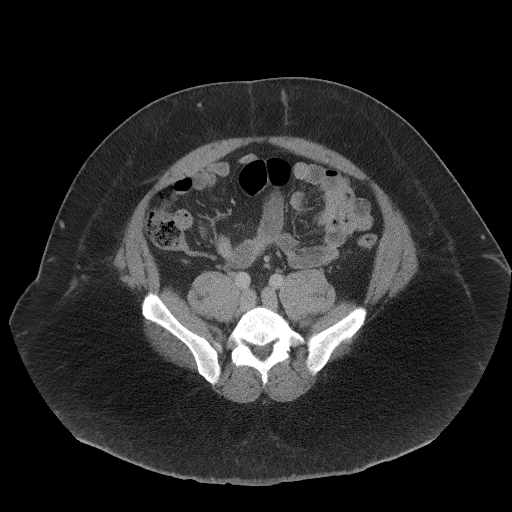
[im 78/122  soft-tissue]
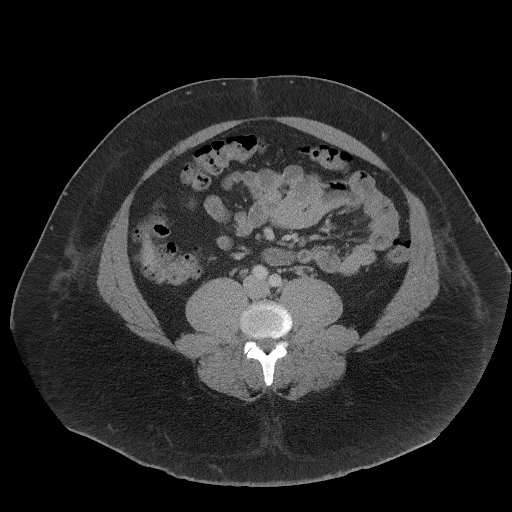
[im 83/122  soft-tissue]
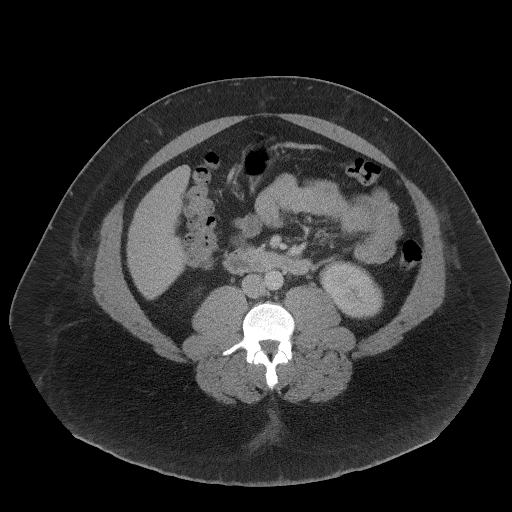
[im 83/122  bone]
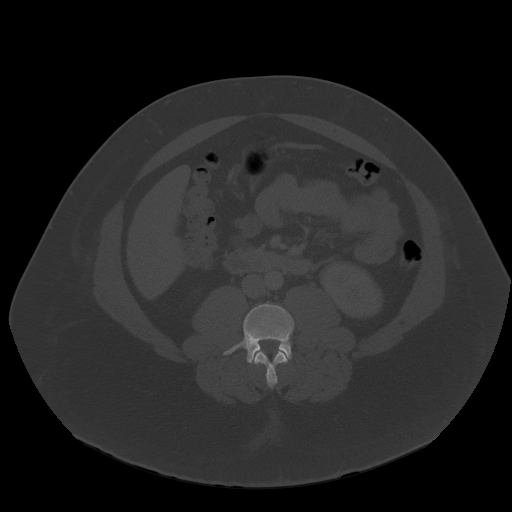
[im 94/122  soft-tissue]
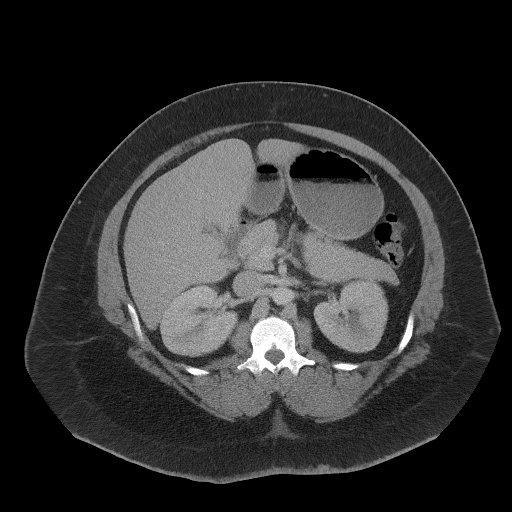
[im 105/122  soft-tissue]
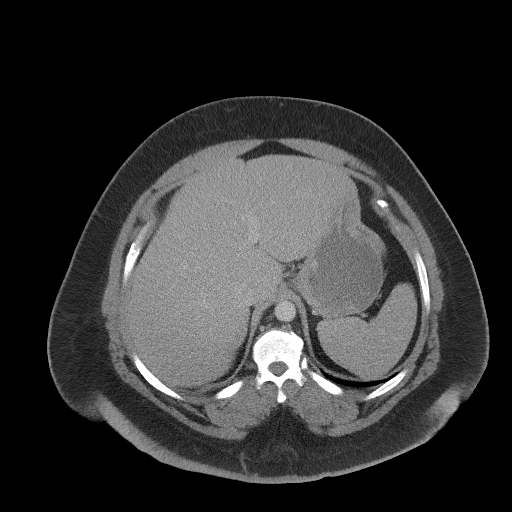
[im 116/122  soft-tissue]
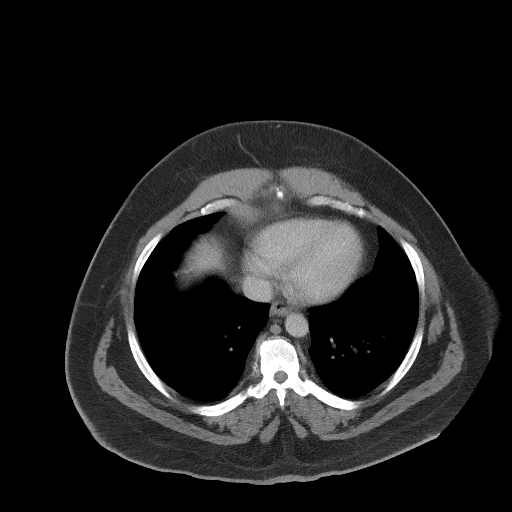

[Series 5: coronal st · coronal · 1.02mm/px · 3 of 125 slices shown]
[im 42/125  soft-tissue]
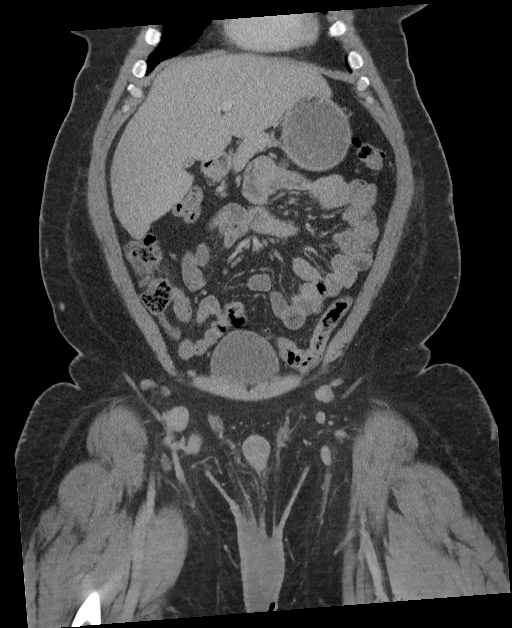
[im 56/125  soft-tissue]
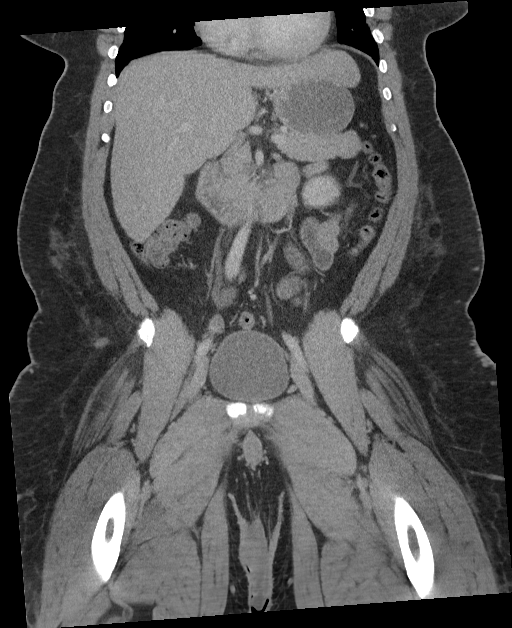
[im 69/125  soft-tissue]
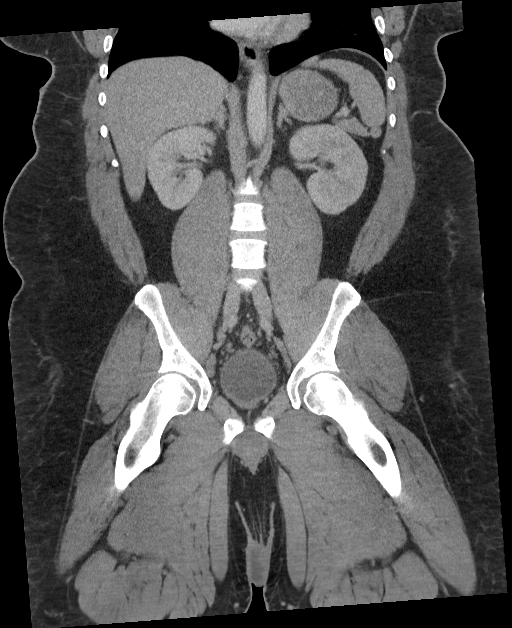

[15 of 46 positions shown; findings below may reference images not displayed]

FINDINGS: Lower chest: Lung bases are clear. No effusion. No consolidative
changes.

Hepatobiliary: Lobular hepatic contours. Mild hepatomegaly. 19 cm
greatest craniocaudal dimension. Background hepatic steatosis. No
focal lesion. No pericholecystic stranding. No biliary duct
dilation. Portal vein is patent.

Pancreas: Normal, without mass, inflammation or ductal dilatation.

Spleen: Spleen normal size and contour.

Adrenals/Urinary Tract: Adrenal glands are normal.

Symmetric renal enhancement. No hydronephrosis. Smooth contour of
the urinary bladder.

Stomach/Bowel: No acute gastrointestinal process. Appendix is
normal.

Vascular/Lymphatic: Abdominal aorta with normal caliber. Smooth
contour the IVC. There is no gastrohepatic or hepatoduodenal
ligament lymphadenopathy. No retroperitoneal or mesenteric
lymphadenopathy.

Prominent bilateral pelvic lymph nodes up to 16 mm short axis
dimension. Mild enlargement of pelvic sidewall lymph nodes but less
than a cm short axis. Scattered external iliac lymph nodes

Reproductive: Prostate unremarkable.

Stranding extending throughout the scrotum worse on the RIGHT.
Stranding extending from the scrotum to the base of the penis and
into the inguinal crease, slightly and greater on the RIGHT than the
LEFT along the inferior inguinal crease also along the perineum. No
gas in the soft tissues. The most inferior aspect of the scrotum is
excluded from view. This is only a small area of the scrotum.

Heterogeneous area with peripheral enhancement and central low
attenuation in the RIGHT hemiscrotum measuring 3.3 x 2.3 cm axial
dimension and 2.5 cm greatest craniocaudal dimension. Diffuse
scrotal edema and stranding in the scrotum elsewhere.

Other: No ascites.

Musculoskeletal: No acute musculoskeletal process. Spinal
degenerative changes. Bilateral flank edema, mild.
IMPRESSION: 1. Scrotal abscess versus complex phlegmon in the RIGHT hemiscrotum
with extensive stranding about the scrotum and perineum extending to
the base of the penis. No gas in the soft tissues.
2. Given constellation of findings early aggressive soft tissue
infection or Rooker gangrene is considered. Correlate with any
recent rapid worsening clinical symptoms and suggest urologic
consultation for further evaluation. No gas currently noted in soft
tissues.
3. Prominent bilateral pelvic lymph nodes are likely reactive.
4. Hepatic steatosis with lobular hepatic contours.

These results were called by telephone at the time of interpretation
on 05/14/2021 at [DATE] to provider Azile, Janiine, who
verbally acknowledged these results.

## 2023-07-06 IMAGING — US US SCROTUM W/ DOPPLER COMPLETE
1 series · 13 of 25 positions shown · non-contrast
Comparison: None.

CLINICAL DATA: Acute scrotal pain and swelling.

EXAM:
SCROTAL ULTRASOUND
DOPPLER ULTRASOUND OF THE TESTICLES
TECHNIQUE: Complete ultrasound examination of the testicles, epididymis, and
other scrotal structures was performed. Color and spectral Doppler
ultrasound were also utilized to evaluate blood flow to the
testicles.

[Series 1: us scrotum w/doppler · 59 acquisitions, 13 frames shown]
[im 1/59]
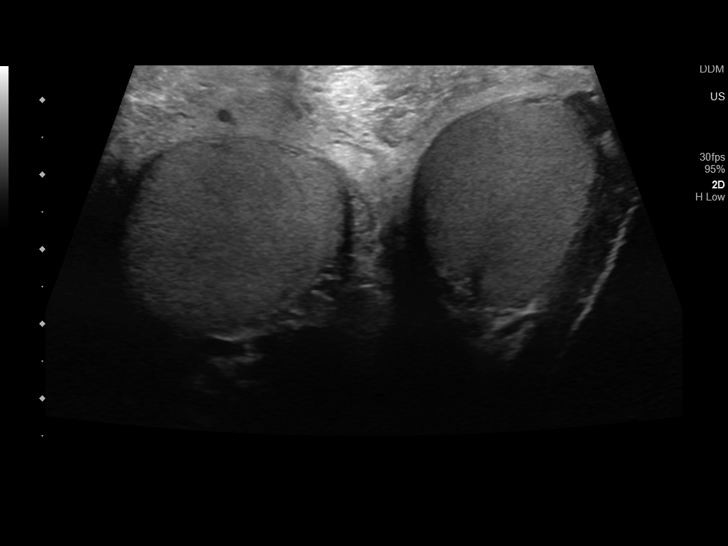
[im 5/59]
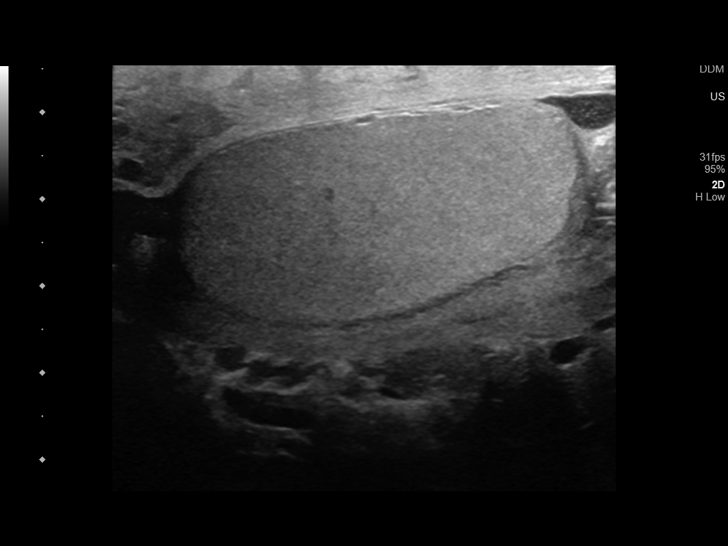
[im 10/59]
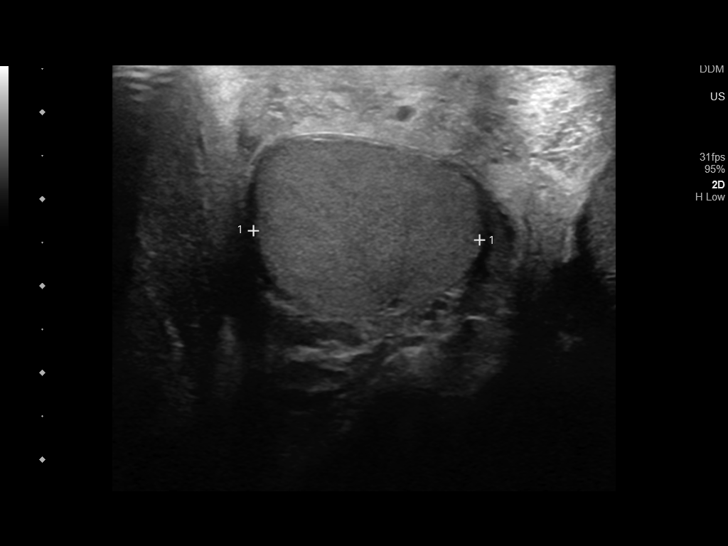
[im 15/59]
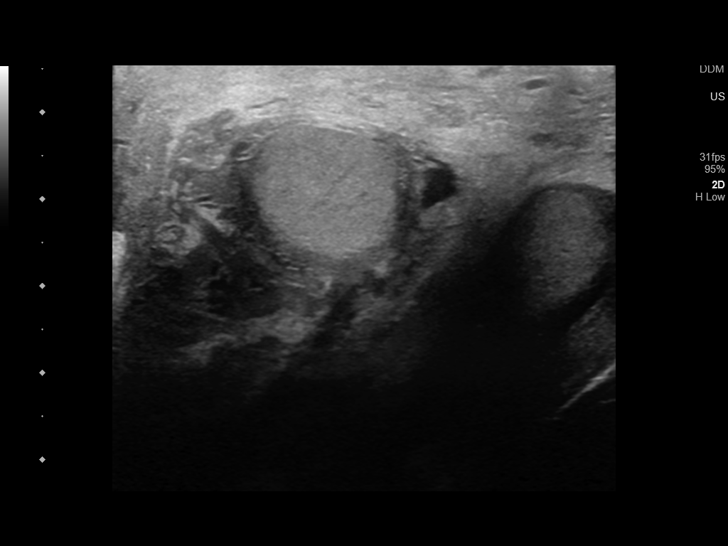
[im 20/59]
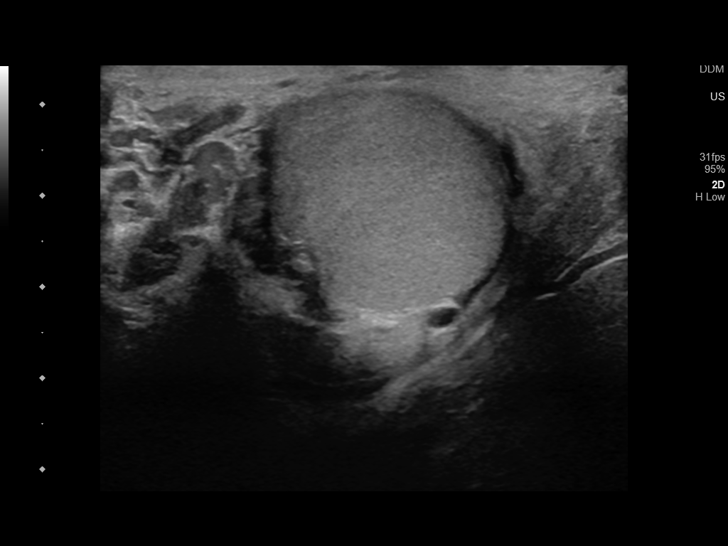
[im 25/59]
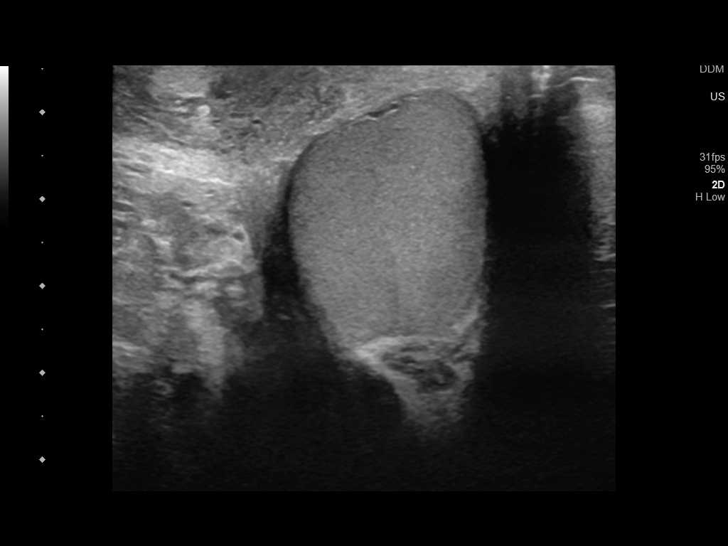
[im 30/59]
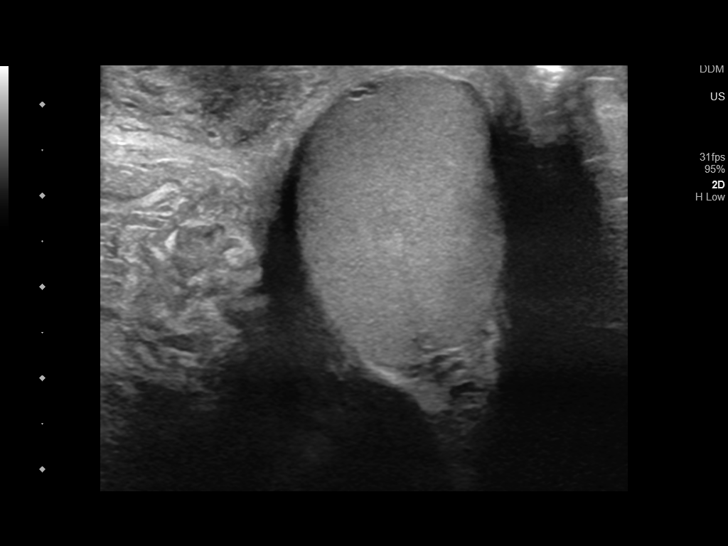
[im 34/59]
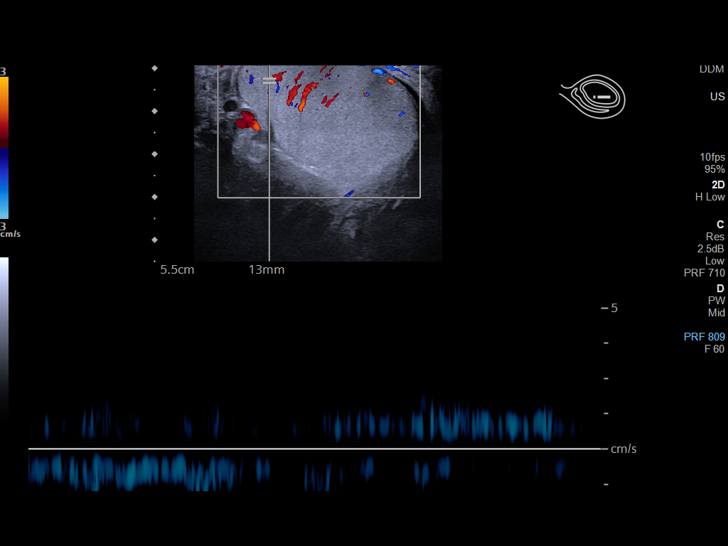
[im 39/59]
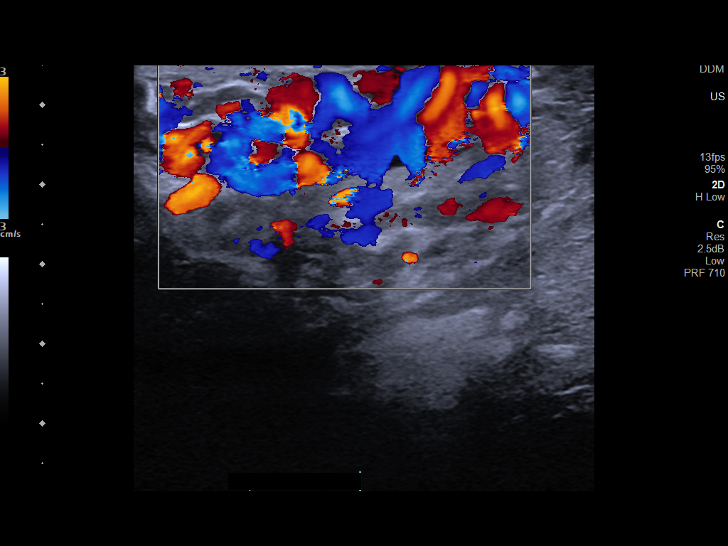
[im 44/59]
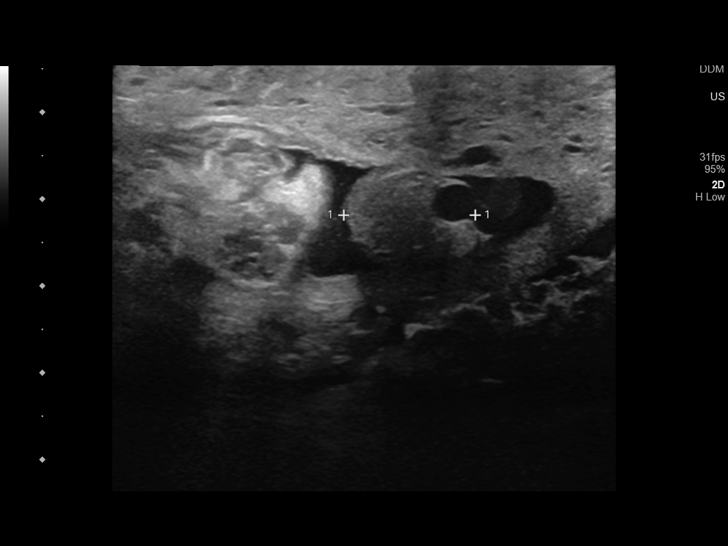
[im 49/59]
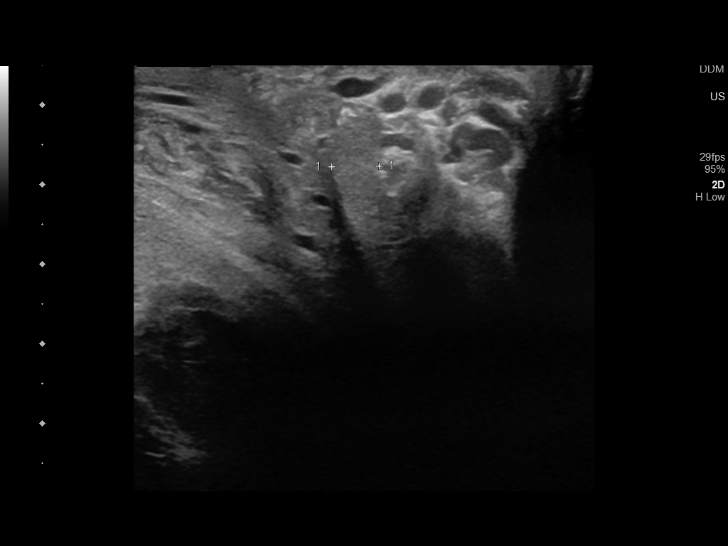
[im 54/59]
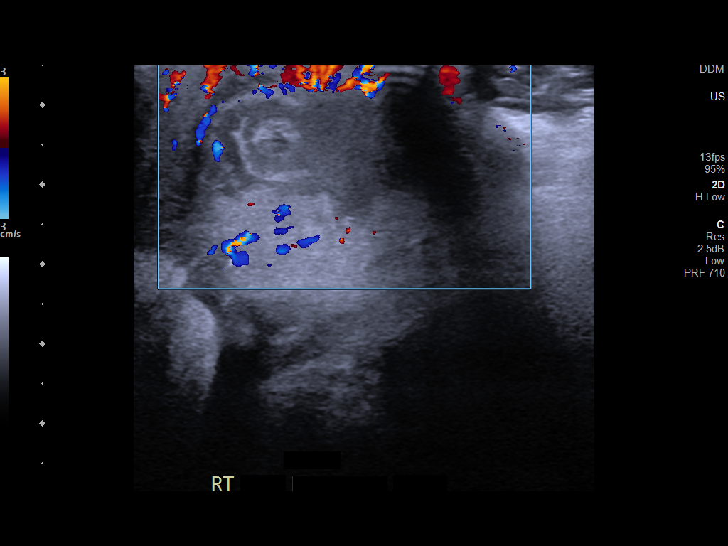
[im 59/59]
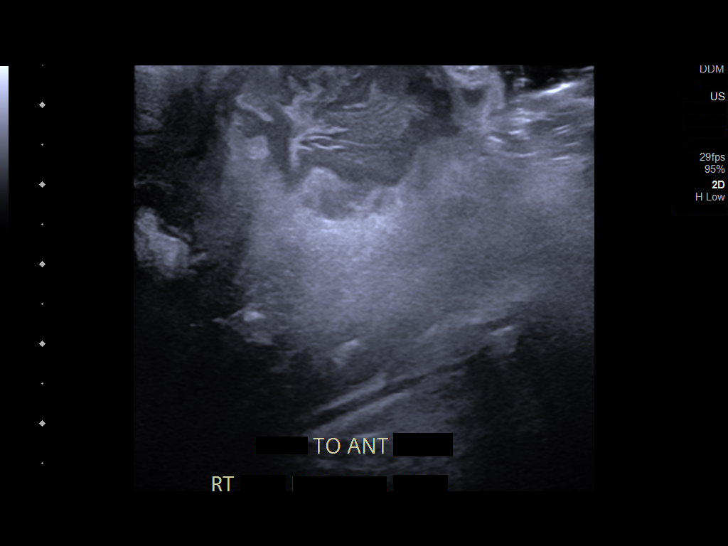

[13 of 25 positions shown; findings below may reference images not displayed]

FINDINGS: Right testicle

Measurements: 4.7 x 2.6 x 2.3 cm. No mass or microlithiasis
visualized.

Left testicle

Measurements: 4.5 x 2.9 x 2.3 cm. No mass or microlithiasis
visualized.

Right epididymis:  5 mm epididymal cyst is noted.

Left epididymis:  Normal in size and appearance.

Hydrocele:  None visualized.

Varicocele:  Small left-sided varicocele is noted.

Pulsed Doppler interrogation of both testes demonstrates normal low
resistance arterial and venous waveforms bilaterally.

Complex abnormality is seen lateral to the right scrotal region
which corresponds to area of tenderness. This is concerning for
large right inguinal hernia which contains loop of bowel.
IMPRESSION: No evidence of testicular mass or torsion.

Complex abnormality is seen lateral to the right scrotal region
which corresponds to area of palpable tenderness. This is concerning
for large right inguinal hernia which contains loop of bowel. CT
scan is recommended for further evaluation.

Small left-sided varicocele.

## 2023-07-17 DIAGNOSIS — G4733 Obstructive sleep apnea (adult) (pediatric): Secondary | ICD-10-CM | POA: Diagnosis not present

## 2023-07-20 ENCOUNTER — Ambulatory Visit: Payer: PRIVATE HEALTH INSURANCE | Admitting: Dermatology

## 2023-08-16 DIAGNOSIS — G4733 Obstructive sleep apnea (adult) (pediatric): Secondary | ICD-10-CM | POA: Diagnosis not present

## 2023-08-19 ENCOUNTER — Ambulatory Visit: Payer: 59 | Admitting: Dermatology

## 2023-08-19 ENCOUNTER — Encounter: Payer: Self-pay | Admitting: Dermatology

## 2023-08-19 DIAGNOSIS — B079 Viral wart, unspecified: Secondary | ICD-10-CM

## 2023-08-19 NOTE — Progress Notes (Signed)
   Follow-Up Visit   Subjective  Jorge Allen is a 42 y.o. male who presents for the following: skin tag on left cheek  Patient present today for follow up visit for follow up. Patient was last evaluated on 05/19/23 Pt expressed that he has a skin tag on his left cheek that he would liked looked at today. Patient denies medication changes.  The following portions of the chart were reviewed this encounter and updated as appropriate: medications, allergies, medical history  Review of Systems:  No other skin or systemic complaints except as noted in HPI or Assessment and Plan.   Objective  Well appearing patient in no apparent distress; mood and affect are within normal limits.    A focused examination was performed of the following areas: left cheek   Relevant exam findings are noted in the Assessment and Plan.  Left Zygomatic Area Verrucous papule with fingerlike projections     Assessment & Plan    FILIFORM WART Exam: Finger-like verrucous papule  Treatment Plan: -Plan: Counseling I counseled the patient regarding the following: Skin Care: Verruca Vulgaris can be treated with retinoids, aldara, salicylic acid preparations or cryotherapy. Expectations: Verruca Vulgaris are cauliflower-like bumps caused by viral infections. They can be spread through direct contact and usually resolve with treatment. Contact Office if: The warts spread, or recur despite treatment.     Filiform wart Left Zygomatic Area  Destruction of lesion - Left Zygomatic Area Complexity: simple   Destruction method: cryotherapy   Informed consent: discussed and consent obtained   Timeout:  patient name, date of birth, surgical site, and procedure verified Lesion destroyed using liquid nitrogen: Yes   Region frozen until ice ball extended beyond lesion: Yes   Outcome: patient tolerated procedure well with no complications   Post-procedure details: wound care instructions given     - This  procedure was medically necessary because the lesions that were treated were: inflamed, irritated, and contagious. The patient's consent was obtained including but not limited to risks of crusting, scabbing, blistering, scarring, darker or lighter pigmentary change, recurrence, incomplete removal and infection.  No follow-ups on file.    Documentation: I have reviewed the above documentation for accuracy and completeness, and I agree with the above.   I, Shirron Marcha Solders, CMA, am acting as scribe for [employer], DO.   Langston Reusing, DO

## 2023-08-19 NOTE — Patient Instructions (Addendum)

## 2023-08-25 ENCOUNTER — Encounter (HOSPITAL_BASED_OUTPATIENT_CLINIC_OR_DEPARTMENT_OTHER): Payer: Self-pay | Admitting: Emergency Medicine

## 2023-08-25 ENCOUNTER — Emergency Department (HOSPITAL_BASED_OUTPATIENT_CLINIC_OR_DEPARTMENT_OTHER)
Admission: EM | Admit: 2023-08-25 | Discharge: 2023-08-25 | Disposition: A | Discharge status: Home / Self Care | Payer: Self-pay | Attending: Emergency Medicine | Admitting: Emergency Medicine

## 2023-08-25 DIAGNOSIS — K529 Noninfective gastroenteritis and colitis, unspecified: Secondary | ICD-10-CM | POA: Insufficient documentation

## 2023-08-25 DIAGNOSIS — Z79899 Other long term (current) drug therapy: Secondary | ICD-10-CM | POA: Insufficient documentation

## 2023-08-25 DIAGNOSIS — Z7984 Long term (current) use of oral hypoglycemic drugs: Secondary | ICD-10-CM | POA: Insufficient documentation

## 2023-08-25 LAB — COMPREHENSIVE METABOLIC PANEL
ALT: 21 U/L (ref 0–44)
AST: 33 U/L (ref 15–41)
Albumin: 3.9 g/dL (ref 3.5–5.0)
Alkaline Phosphatase: 58 U/L (ref 38–126)
Anion gap: 8 (ref 5–15)
BUN: 11 mg/dL (ref 6–20)
CO2: 23 mmol/L (ref 22–32)
Calcium: 9.3 mg/dL (ref 8.9–10.3)
Chloride: 101 mmol/L (ref 98–111)
Creatinine, Ser: 0.86 mg/dL (ref 0.61–1.24)
GFR, Estimated: >60 mL/min (ref >60)
Glucose, Bld: 111 mg/dL — ABNORMAL HIGH (ref 70–99)
Potassium: 3.9 mmol/L (ref 3.5–5.1)
Sodium: 132 mmol/L — ABNORMAL LOW (ref 135–145)
Total Bilirubin: 0.8 mg/dL (ref <1.2)
Total Protein: 7.7 g/dL (ref 6.5–8.1)

## 2023-08-25 LAB — CBC WITH DIFFERENTIAL/PLATELET
Abs Immature Granulocytes: 0.03 10*3/uL (ref 0.00–0.07)
Basophils Absolute: 0 10*3/uL (ref 0.0–0.1)
Basophils Relative: 0 %
Eosinophils Absolute: 0 10*3/uL (ref 0.0–0.5)
Eosinophils Relative: 0 %
HCT: 47.7 % (ref 39.0–52.0)
Hemoglobin: 16 g/dL (ref 13.0–17.0)
Immature Granulocytes: 0 %
Lymphocytes Relative: 18 %
Lymphs Abs: 1.6 10*3/uL (ref 0.7–4.0)
MCH: 28 pg (ref 26.0–34.0)
MCHC: 33.5 g/dL (ref 30.0–36.0)
MCV: 83.5 fL (ref 80.0–100.0)
Monocytes Absolute: 1.1 10*3/uL — ABNORMAL HIGH (ref 0.1–1.0)
Monocytes Relative: 11 %
Neutro Abs: 6.5 10*3/uL (ref 1.7–7.7)
Neutrophils Relative %: 71 %
Platelets: 192 10*3/uL (ref 150–400)
RBC: 5.71 MIL/uL (ref 4.22–5.81)
RDW: 14.4 % (ref 11.5–15.5)
WBC: 9.3 10*3/uL (ref 4.0–10.5)
nRBC: 0 % (ref 0.0–0.2)

## 2023-08-25 LAB — LIPASE, BLOOD: Lipase: 13 U/L (ref 11–51)

## 2023-08-25 MED ORDER — DIPHENOXYLATE-ATROPINE 2.5-0.025 MG PO TABS
2.0000 | ORAL_TABLET | Freq: Once | ORAL | Status: AC
  Administered 2023-08-25: 2 via ORAL
  Filled 2023-08-25: qty 2

## 2023-08-25 MED ORDER — ONDANSETRON HCL 4 MG PO TABS: 4.0000 mg | ORAL_TABLET | Freq: Four times a day (QID) | ORAL | 0 refills | Status: DC

## 2023-08-25 MED ORDER — DIPHENOXYLATE-ATROPINE 2.5-0.025 MG PO TABS: 2.0000 | ORAL_TABLET | Freq: Four times a day (QID) | ORAL | 0 refills | Status: DC | PRN

## 2023-08-25 NOTE — ED Triage Notes (Signed)
N/v/d since yesterday  Reports losing 11lb since yesterday "I just dont feel right"

## 2023-08-25 NOTE — Discharge Instructions (Signed)
Use the medications for symptom relief.   Follow up with your doctor if symptoms persist.

## 2023-08-25 NOTE — ED Provider Notes (Signed)
Jorge Allen   CSN: [id number] Arrival date & time: 08/25/23  2145     History  Chief Complaint  Patient presents with   Diarrhea    Jorge Allen is a 42 y.o. male.  Patient to ED c/o nausea, vomiting and diarrhea since yesterday around 4:00 pm. Reports he feels unwell. Reports migratory abdominal pain. No fever. No blood in stools or emesis. He has not taken anything for his symptoms.   The history is provided by the patient. No language interpreter was used.  Diarrhea      Home Medications Prior to Admission medications   Medication Sig Start Date End Date Taking? Authorizing Provider  diphenoxylate-atropine (LOMOTIL) 2.5-0.025 MG tablet Take 2 tablets by mouth 4 (four) times daily as needed for diarrhea or loose stools (use the medication if having more than 5 bowel movements daily.). 08/25/23  Yes Hamed Debella, PA-C  ondansetron (ZOFRAN) 4 MG tablet Take 1 tablet (4 mg total) by mouth every 6 (six) hours. 08/25/23  Yes Whitman Meinhardt, Melvenia Beam, PA-C  benzonatate (TESSALON) 100 MG capsule Take 100 mg by mouth 3 (three) times daily as needed for cough.    [provider]  cetirizine (ZYRTEC ALLERGY) 10 MG tablet Take 1 tablet (10 mg total) by mouth daily. 03/24/23 03/23/24  Arnette Felts, FNP  clindamycin (CLEOCIN T) 1 % external solution Apply topically daily. 05/19/23 05/18/24  Terri Piedra, DO  cyclobenzaprine (FLEXERIL) 10 MG tablet Take 1 tablet (10 mg total) by mouth 3 (three) times daily as needed for muscle spasms. 08/14/22   Arnette Felts, FNP  dicyclomine (BENTYL) 10 MG capsule Take 1 capsule (10 mg total) by mouth every 8 (eight) hours as needed (abdominal cramping). 12/10/22   Dionne Bucy, MD  doxycycline (MONODOX) 100 MG capsule Take 1 capsule (100 mg total) by mouth 2 (two) times daily. Take twice daily for up to 1 week as needed for flares. 05/19/23   Terri Piedra, DO  hydrOXYzine (ATARAX) 10 MG  tablet Take 1 tablet (10 mg total) by mouth 3 (three) times daily as needed. 08/14/22   Arnette Felts, FNP  metFORMIN (GLUCOPHAGE) 500 MG tablet Take 1 tablet (500 mg total) by mouth daily with breakfast. 11/25/22   Arnette Felts, FNP  MOBIC 15 MG tablet Take 1 tablet (15 mg total) by mouth daily. 08/14/22   Arnette Felts, FNP  mometasone (NASONEX) 50 MCG/ACT nasal spray Place 2 sprays into the nose daily. 03/24/23 03/23/24  Arnette Felts, FNP  omeprazole (PRILOSEC OTC) 20 MG tablet Take 1 tablet (20 mg total) by mouth daily. 11/25/22   Arnette Felts, FNP  promethazine-dextromethorphan (PROMETHAZINE-DM) 6.25-15 MG/5ML syrup Take 5 mLs by mouth every 6 (six) hours as needed for cough. 07/01/23   Gustavus Bryant, FNP  Vitamin D, Ergocalciferol, (DRISDOL) 1.25 MG (50000 UNIT) CAPS capsule Take 1 capsule (50,000 Units total) by mouth 2 (two) times a week. 03/26/23   Arnette Felts, FNP      Allergies    Patient has no known allergies.    Review of Systems   Review of Systems  Gastrointestinal:  Positive for diarrhea.    Physical Exam Updated Vital Signs BP 130/68   Pulse 95   Temp 100 F (37.8 C) (Oral)   Resp 20   SpO2 99%  Physical Exam Vitals and nursing Allen reviewed.  Constitutional:      Appearance: Normal appearance. He is obese.  HENT:  Head: Normocephalic.     Mouth/Throat:     Mouth: Mucous membranes are moist.  Cardiovascular:     Rate and Rhythm: Normal rate and regular rhythm.     Heart sounds: No murmur heard. Pulmonary:     Effort: Pulmonary effort is normal.     Breath sounds: Normal breath sounds.  Abdominal:     Palpations: Abdomen is soft.     Tenderness: There is no abdominal tenderness. There is no guarding.  Musculoskeletal:        General: Normal range of motion.     Cervical back: Normal range of motion and neck supple.  Skin:    General: Skin is warm and dry.  Neurological:     Mental Status: He is alert and oriented to person, place, and time.     ED  Results / Procedures / Treatments   Labs (all labs ordered are listed, but only abnormal results are displayed) Labs Reviewed  CBC WITH DIFFERENTIAL/PLATELET - Abnormal; Notable for the following components:      Result Value   Monocytes Absolute 1.1 (*)    All other components within normal limits  COMPREHENSIVE METABOLIC PANEL - Abnormal; Notable for the following components:   Sodium 132 (*)    Glucose, Bld 111 (*)    All other components within normal limits  LIPASE, BLOOD   Results for orders placed or performed during the hospital encounter of 08/25/23  CBC with Differential  Result Value Ref Range   WBC 9.3 4.0 - 10.5 K/uL   RBC 5.71 4.22 - 5.81 MIL/uL   Hemoglobin 16.0 13.0 - 17.0 g/dL   HCT 95.2 84.1 - 32.4 %   MCV 83.5 80.0 - 100.0 fL   MCH 28.0 26.0 - 34.0 pg   MCHC 33.5 30.0 - 36.0 g/dL   RDW 40.1 02.7 - 25.3 %   Platelets 192 150 - 400 K/uL   nRBC 0.0 0.0 - 0.2 %   Neutrophils Relative % 71 %   Neutro Abs 6.5 1.7 - 7.7 K/uL   Lymphocytes Relative 18 %   Lymphs Abs 1.6 0.7 - 4.0 K/uL   Monocytes Relative 11 %   Monocytes Absolute 1.1 (H) 0.1 - 1.0 K/uL   Eosinophils Relative 0 %   Eosinophils Absolute 0.0 0.0 - 0.5 K/uL   Basophils Relative 0 %   Basophils Absolute 0.0 0.0 - 0.1 K/uL   Immature Granulocytes 0 %   Abs Immature Granulocytes 0.03 0.00 - 0.07 K/uL  Comprehensive metabolic panel  Result Value Ref Range   Sodium 132 (L) 135 - 145 mmol/L   Potassium 3.9 3.5 - 5.1 mmol/L   Chloride 101 98 - 111 mmol/L   CO2 23 22 - 32 mmol/L   Glucose, Bld 111 (H) 70 - 99 mg/dL   BUN 11 6 - 20 mg/dL   Creatinine, Ser 6.64 0.61 - 1.24 mg/dL   Calcium 9.3 8.9 - 40.3 mg/dL   Total Protein 7.7 6.5 - 8.1 g/dL   Albumin 3.9 3.5 - 5.0 g/dL   AST 33 15 - 41 U/L   ALT 21 0 - 44 U/L   Alkaline Phosphatase 58 38 - 126 U/L   Total Bilirubin 0.8 <1.2 mg/dL   GFR, Estimated >47 >42 mL/min   Anion gap 8 5 - 15  Lipase, blood  Result Value Ref Range   Lipase 13 11 - 51  U/L    EKG None  Radiology No results found.  Procedures Procedures  Medications Ordered in ED Medications  diphenoxylate-atropine (LOMOTIL) 2.5-0.025 MG per tablet 2 tablet (has no administration in time range)    ED Course/ Medical Decision Making/ A&P Clinical Course as of 08/25/23 2315  Tue Aug 25, 2023  2215 Patient to ED with N, V, D x 1 day. No fever at home. No sick contacts. No recent antibiotic use. Currently eating [employer] in the room.  [SU]  2311 No vomiting. No BM's after getting into patient room. Labs are unremarkable, mild hyponatremia at 132. Lomotil and Zofran. PCP follow up.  [SU]    Clinical Course User Index [SU] Elpidio Anis, PA-C                                 Medical Decision Making Amount and/or Complexity of Data Reviewed Labs: ordered.  Risk Prescription drug management.           Final Clinical Impression(s) / ED Diagnoses Final diagnoses:  Gastroenteritis    Rx / DC Orders ED Discharge Orders          Ordered    diphenoxylate-atropine (LOMOTIL) 2.5-0.025 MG tablet  4 times daily PRN        08/25/23 2315    ondansetron (ZOFRAN) 4 MG tablet  Every 6 hours        08/25/23 2315              Elpidio Anis, PA-C 08/25/23 2316    Rondel Baton, MD 08/26/23 1019

## 2023-08-25 NOTE — ED Notes (Signed)
Discharge instructions reviewed with patient. Patient questions answered and opportunity for education reviewed. Patient voices understanding of discharge instructions with no further questions. Patient ambulatory with steady gait to lobby.  

## 2023-08-25 NOTE — ED Notes (Signed)
Patient placed on 2LNC while sleeping. Patient with history of sleep apnea.

## 2023-08-26 ENCOUNTER — Telehealth: Payer: Self-pay

## 2023-08-26 NOTE — Transitions of Care (Post Inpatient/ED Visit) (Signed)
   08/26/2023  Name: Jorge Allen MRN: [id number] DOB: 1981-02-26  Today's TOC FU Call Status: Today's TOC FU Call Status:: Unsuccessful Call (1st Attempt) Unsuccessful Call (1st Attempt) Date: 08/26/23  Attempted to reach the patient regarding the most recent Inpatient/ED visit.  Follow Up Plan: Additional outreach attempts will be made to reach the patient to complete the Transitions of Care (Post Inpatient/ED visit) call.   Signature Karena Addison, LPN [facility] Nurse Health Advisor Direct Dial [phone]

## 2023-08-27 ENCOUNTER — Telehealth: Payer: Self-pay

## 2023-08-27 NOTE — Transitions of Care (Post Inpatient/ED Visit) (Signed)
   08/27/2023  Name: Jorge Allen MRN: [id number] DOB: [birth year]  Today's TOC FU Call Status: Today's TOC FU Call Status:: Successful TOC FU Call Completed TOC FU Call Complete Date: 08/27/23 Patient's Name and Date of Birth confirmed.  Transition Care Management Follow-up Telephone Call Date of Discharge: 08/25/23 Discharge Facility: [facility] ([facility]) Type of Discharge: Inpatient Admission Primary Inpatient Discharge Diagnosis:: Diarrhea How have you been since you were released from the hospital?: Better Any questions or concerns?: No  Items Reviewed: Did you receive and understand the discharge instructions provided?: Yes Medications obtained,verified, and reconciled?: Yes (Medications Reviewed) Any new allergies since your discharge?: No Dietary orders reviewed?: No Do you have support at home?: Yes People in Home: spouse  Medications Reviewed Today: Medications Reviewed Today   Medications were not reviewed in this encounter     Home Care and Equipment/Supplies: Were Home Health Services Ordered?: No Any new equipment or medical supplies ordered?: No  Functional Questionnaire: Do you need assistance with bathing/showering or dressing?: No Do you need assistance with meal preparation?: No Do you need assistance with eating?: No Do you have difficulty maintaining continence: No Do you need assistance with getting out of bed/getting out of a chair/moving?: No Do you have difficulty managing or taking your medications?: No  Follow up appointments reviewed: PCP Follow-up appointment confirmed?: No (patient reports if his diarrhea doesnt get better over weekend he will call.) MD Provider Line [phone] Given: Yes Specialist Hospital Follow-up appointment confirmed?: No Do you need transportation to your follow-up appointment?: No Do you understand care options if your condition(s) worsen?: Yes-patient verbalized understanding    SIGNATURE Lisabeth Devoid, CMA

## 2023-09-01 NOTE — Transitions of Care (Post Inpatient/ED Visit) (Signed)
   09/01/2023  Name: Jorge Allen MRN: [id number] DOB: 05-07-1981  Today's TOC FU Call Status: Today's TOC FU Call Status:: Unsuccessful Call (3rd Attempt) Unsuccessful Call (1st Attempt) Date: 08/26/23 Unsuccessful Call (3rd Attempt) Date: 09/01/23  Attempted to reach the patient regarding the most recent Inpatient/ED visit.  Follow Up Plan: No further outreach attempts will be made at this time. We have been unable to contact the patient.  Signature Karena Addison, LPN [facility] Nurse Health Advisor Direct Dial [phone]

## 2023-09-16 DIAGNOSIS — G4733 Obstructive sleep apnea (adult) (pediatric): Secondary | ICD-10-CM | POA: Diagnosis not present

## 2023-09-24 ENCOUNTER — Ambulatory Visit (INDEPENDENT_AMBULATORY_CARE_PROVIDER_SITE_OTHER): Payer: Self-pay | Admitting: Dermatology

## 2023-09-24 ENCOUNTER — Encounter: Payer: Self-pay | Admitting: Dermatology

## 2023-09-24 DIAGNOSIS — L918 Other hypertrophic disorders of the skin: Secondary | ICD-10-CM

## 2023-09-24 NOTE — Progress Notes (Signed)
   Follow-Up Visit   Subjective  Jorge Allen is a 42 y.o. male who presents for the following: Skin tags of face - he would like them removed today.     The following portions of the chart were reviewed this encounter and updated as appropriate: medications, allergies, medical history  Review of Systems:  No other skin or systemic complaints except as noted in HPI or Assessment and Plan.  Objective  Well appearing patient in no apparent distress; mood and affect are within normal limits.   A focused examination was performed of the following areas: Face  Relevant exam findings are noted in the Assessment and Plan.  Face (15) Flesh colored papules    Assessment & Plan       Skin tags near eyes - Assessment: Patient presents with multiple skin tags near the eyes. The clinician performed electrodesiccation ("zapping") to remove these lesions.  Pt aware skin tags on his face are benign and insurance does not cover the cost of removal.  He would like $15 tags removed for $150 dollars.  - Plan:   - Electrodesiccation performed on skin tags.   - Instruct patient to gently wash face morning and night for 1-2 weeks.   - Apply Aquaphor healing ointment to treated areas.   - Samples of Aquaphor provided to patient.   - Follow-up if needed for additional areas (e.g., cheeks or neck).   Skin tag (15) Face  Destruction of lesion - Face (15) Complexity: simple   Destruction method comment:  Electrodesiccation Informed consent: discussed and consent obtained   Timeout:  patient name, date of birth, surgical site, and procedure verified Patient was prepped and draped in usual sterile fashion: patient was prepped with isopropyl alcohol. Anesthesia comment:  Lidocaine 23%/Tetracaine 7% cream applied x ~15 minutes Outcome: patient tolerated procedure well with no complications   Post-procedure details: wound care instructions given      Return if symptoms worsen or fail to  improve.  I, Joanie Coddington, CMA, am acting as scribe for [employer], DO .   Documentation: I have reviewed the above documentation for accuracy and completeness, and I agree with the above.  Langston Reusing, DO

## 2023-09-24 NOTE — Patient Instructions (Addendum)
Dear Jorge Allen,  Thank you for visiting my office today. Your dedication to addressing your dermatological concerns is greatly appreciated, and I am here to support you on your journey to better health.  Here is a summary of the key instructions from today's appointment:  - Treatment for Wart and Skin Tags: Today, we performed a procedure to cosmetically removed the skin tags around your eye.  - Post-Procedure Care:   - Cleansing: Wash your face gently every morning and night for the next 1-2 weeks.   - Application: Apply Aquaphor healing ointment to the treated areas. I have provided you with some packets to take home, and you can purchase more as needed.   - Covering: Avoid covering the treated areas unless instructed otherwise.  - Pain Management: It is normal to experience some discomfort during and after the procedure. The numbing will help minimize this.  - Follow-Up: Please make a medical appointment to monitor the healing process and discuss further treatment if necessary.  Should you have any questions or if there is anything else you need, do not hesitate to contact our office.  Wishing you a smooth recovery and a wonderful holiday season!  Warm regards,  Dr. Langston Reusing,  Dermatologist   Important Information  Due to recent changes in healthcare laws, you may see results of your pathology and/or laboratory studies on MyChart before the doctors have had a chance to review them. We understand that in some cases there may be results that are confusing or concerning to you. Please understand that not all results are received at the same time and often the doctors may need to interpret multiple results in order to provide you with the best plan of care or course of treatment. Therefore, we ask that you please give [place] 2 business days to thoroughly review all your results before contacting the office for clarification. Should we see a critical lab result, you will be  contacted sooner.   If You Need Anything After Your Visit  If you have any questions or concerns for your doctor, please call our main line at [phone] If no one answers, please leave a voicemail as directed and we will return your call as soon as possible. Messages left after 4 pm will be answered the following business day.   You may also send [place] a message via MyChart. We typically respond to MyChart messages within 1-2 business days.  For prescription refills, please ask your pharmacy to contact our office. Our fax number is [phone].  If you have an urgent issue when the clinic is closed that cannot wait until the next business day, you can page your doctor at the number below.    Please note that while we do our best to be available for urgent issues outside of office hours, we are not available 24/7.   If you have an urgent issue and are unable to reach [place], you may choose to seek medical care at your doctor's office, retail clinic, urgent care center, or emergency room.  If you have a medical emergency, please immediately call 911 or go to the emergency department. In the event of inclement weather, please call our main line at [phone] for an update on the status of any delays or closures.  Dermatology Medication Tips: Please keep the boxes that topical medications come in in order to help keep track of the instructions about where and how to use these. Pharmacies typically print the medication instructions only on the  boxes and not directly on the medication tubes.   If your medication is too expensive, please contact our office at [phone] or send [place] a message through MyChart.   We are unable to tell what your co-pay for medications will be in advance as this is different depending on your insurance coverage. However, we may be able to find a substitute medication at lower cost or fill out paperwork to get insurance to cover a needed medication.   If a prior  authorization is required to get your medication covered by your insurance company, please allow [place] 1-2 business days to complete this process.  Drug prices often vary depending on where the prescription is filled and some pharmacies may offer cheaper prices.  The website www.goodrx.com contains coupons for medications through different pharmacies. The prices here do not account for what the cost may be with help from insurance (it may be cheaper with your insurance), but the website can give you the price if you did not use any insurance.  - You can print the associated coupon and take it with your prescription to the pharmacy.  - You may also stop by our office during regular business hours and pick up a GoodRx coupon card.  - If you need your prescription sent electronically to a different pharmacy, notify our office through [facility] or by phone at [phone]

## 2023-09-29 ENCOUNTER — Emergency Department (HOSPITAL_BASED_OUTPATIENT_CLINIC_OR_DEPARTMENT_OTHER): Payer: 59 | Admitting: Radiology

## 2023-09-29 ENCOUNTER — Encounter (HOSPITAL_BASED_OUTPATIENT_CLINIC_OR_DEPARTMENT_OTHER): Payer: Self-pay | Admitting: Emergency Medicine

## 2023-09-29 ENCOUNTER — Emergency Department (HOSPITAL_BASED_OUTPATIENT_CLINIC_OR_DEPARTMENT_OTHER)
Admission: EM | Admit: 2023-09-29 | Discharge: 2023-09-29 | Disposition: A | Discharge status: Home / Self Care | Payer: 59 | Attending: Emergency Medicine | Admitting: Emergency Medicine

## 2023-09-29 ENCOUNTER — Other Ambulatory Visit: Payer: Self-pay

## 2023-09-29 DIAGNOSIS — R042 Hemoptysis: Secondary | ICD-10-CM | POA: Insufficient documentation

## 2023-09-29 DIAGNOSIS — Z720 Tobacco use: Secondary | ICD-10-CM | POA: Insufficient documentation

## 2023-09-29 DIAGNOSIS — J0100 Acute maxillary sinusitis, unspecified: Secondary | ICD-10-CM | POA: Insufficient documentation

## 2023-09-29 DIAGNOSIS — Z1152 Encounter for screening for COVID-19: Secondary | ICD-10-CM | POA: Insufficient documentation

## 2023-09-29 LAB — RESP PANEL BY RT-PCR (RSV, FLU A&B, COVID)  RVPGX2
Influenza A by PCR: NEGATIVE
Influenza B by PCR: NEGATIVE
Resp Syncytial Virus by PCR: NEGATIVE
SARS Coronavirus 2 by RT PCR: NEGATIVE

## 2023-09-29 MED ORDER — AMOXICILLIN-POT CLAVULANATE 875-125 MG PO TABS: 1.0000 | ORAL_TABLET | Freq: Two times a day (BID) | ORAL | 0 refills | Status: DC

## 2023-09-29 NOTE — ED Triage Notes (Signed)
Pt reports loss of taste an smell, also congestion with bloody mucus, x 1 week

## 2023-09-29 NOTE — Discharge Instructions (Addendum)
Thank you for allowing [place] to be a part of your care today.  You were evaluated in the ED for cough with hemoptysis (blood) and sinus pressure/pain.  Your chest x-ray was negative for infection such as pneumonia.  Your lungs can become dry and irritated which causes small blood vessels to rupture when you cough too hard.  I recommend continuing to use your albuterol inhaler if you experience shortness of breath or wheezing.  You can take over-the-counter cough medicines (active ingredient dextromethorphan) to help suppress your cough.   I am prescribing an antibiotic to treat you for a suspected sinus infection.  Take as prescribed and for the entire duration, even if your symptoms begin to improve.    If you smoke or vape, it is important to stop using these types of products as it may make your symptoms worse.   Schedule a follow up appointment with your primary care doctor.   Return to the ED if you develop sudden worsening of your symptoms or if you have new concerns.

## 2023-09-29 NOTE — ED Notes (Signed)
Discharge paperwork given and verbally understood. 

## 2023-09-29 NOTE — ED Provider Notes (Signed)
[facility] AT [facility] Provider Note   CSN: [id number] Arrival date & time: 09/29/23  1143     History  Chief Complaint  Patient presents with   Hemoptysis    Jorge Allen is a 42 y.o. male with past medical history significant for OSA, tobacco use presents to the ED complaining of loss of taste and smell, congestion, sinus pressure/pain and cough.  He reports he has had blood streaked mucus from his nose and when he coughs.  He has also had small amounts of brown mucus.  He has been using his albuterol inhaler and OTC medications.  Denies fever, chills, sore throat, shortness of breath, chest pain, wheezing, chest tightness.        Home Medications Prior to Admission medications   Medication Sig Start Date End Date Taking? Authorizing Provider  amoxicillin-clavulanate (AUGMENTIN) 875-125 MG tablet Take 1 tablet by mouth every 12 (twelve) hours. 09/29/23  Yes Adithya Difrancesco R, PA-C  benzonatate (TESSALON) 100 MG capsule Take 100 mg by mouth 3 (three) times daily as needed for cough.    [provider]  cetirizine (ZYRTEC ALLERGY) 10 MG tablet Take 1 tablet (10 mg total) by mouth daily. 03/24/23 03/23/24  Arnette Felts, FNP  clindamycin (CLEOCIN T) 1 % external solution Apply topically daily. 05/19/23 05/18/24  Terri Piedra, DO  cyclobenzaprine (FLEXERIL) 10 MG tablet Take 1 tablet (10 mg total) by mouth 3 (three) times daily as needed for muscle spasms. 08/14/22   Arnette Felts, FNP  dicyclomine (BENTYL) 10 MG capsule Take 1 capsule (10 mg total) by mouth every 8 (eight) hours as needed (abdominal cramping). 12/10/22   Dionne Bucy, MD  diphenoxylate-atropine (LOMOTIL) 2.5-0.025 MG tablet Take 2 tablets by mouth 4 (four) times daily as needed for diarrhea or loose stools (use the medication if having more than 5 bowel movements daily.). 08/25/23   Elpidio Anis, PA-C  hydrOXYzine (ATARAX) 10 MG tablet Take 1 tablet (10 mg total) by  mouth 3 (three) times daily as needed. 08/14/22   Arnette Felts, FNP  metFORMIN (GLUCOPHAGE) 500 MG tablet Take 1 tablet (500 mg total) by mouth daily with breakfast. 11/25/22   Arnette Felts, FNP  MOBIC 15 MG tablet Take 1 tablet (15 mg total) by mouth daily. 08/14/22   Arnette Felts, FNP  mometasone (NASONEX) 50 MCG/ACT nasal spray Place 2 sprays into the nose daily. 03/24/23 03/23/24  Arnette Felts, FNP  omeprazole (PRILOSEC OTC) 20 MG tablet Take 1 tablet (20 mg total) by mouth daily. 11/25/22   Arnette Felts, FNP  ondansetron (ZOFRAN) 4 MG tablet Take 1 tablet (4 mg total) by mouth every 6 (six) hours. 08/25/23   Elpidio Anis, PA-C  promethazine-dextromethorphan (PROMETHAZINE-DM) 6.25-15 MG/5ML syrup Take 5 mLs by mouth every 6 (six) hours as needed for cough. 07/01/23   Gustavus Bryant, FNP  Vitamin D, Ergocalciferol, (DRISDOL) 1.25 MG (50000 UNIT) CAPS capsule Take 1 capsule (50,000 Units total) by mouth 2 (two) times a week. 03/26/23   Arnette Felts, FNP      Allergies    Patient has no known allergies.    Review of Systems   Review of Systems  Constitutional:  Negative for chills and fever.  HENT:  Positive for congestion, sinus pressure and sinus pain. Negative for sore throat.   Respiratory:  Positive for cough. Negative for chest tightness, shortness of breath and wheezing.   Cardiovascular:  Negative for chest pain.    Physical Exam Updated Vital  Signs BP 104/70 (BP Location: Right Arm)   Pulse 61   Temp 98.2 F (36.8 C) (Oral)   Resp 16   Wt 130.2 kg   SpO2 91%   BMI 47.76 kg/m  Physical Exam Vitals and nursing note reviewed.  Constitutional:      General: He is not in acute distress.    Appearance: Normal appearance. He is not ill-appearing or diaphoretic.  HENT:     Right Ear: Tympanic membrane and ear canal normal.     Left Ear: Tympanic membrane and ear canal normal.     Nose: Congestion present. No rhinorrhea.     Right Sinus: No maxillary sinus tenderness or frontal  sinus tenderness.     Left Sinus: Maxillary sinus tenderness present. No frontal sinus tenderness.     Mouth/Throat:     Lips: Pink.     Mouth: Mucous membranes are moist.     Pharynx: Oropharynx is clear. Uvula midline. No posterior oropharyngeal erythema or postnasal drip.  Cardiovascular:     Rate and Rhythm: Normal rate and regular rhythm.  Pulmonary:     Effort: Pulmonary effort is normal. No tachypnea or respiratory distress.     Breath sounds: Normal breath sounds and air entry. No wheezing.  Skin:    General: Skin is warm and dry.     Capillary Refill: Capillary refill takes less than 2 seconds.  Neurological:     Mental Status: He is alert. Mental status is at baseline.  Psychiatric:        Mood and Affect: Mood normal.        Behavior: Behavior normal.     ED Results / Procedures / Treatments   Labs (all labs ordered are listed, but only abnormal results are displayed) Labs Reviewed  RESP PANEL BY RT-PCR (RSV, FLU A&B, COVID)  [id number]    EKG None  Radiology DG Chest 2 View  Result Date: 09/29/2023 CLINICAL DATA:  Hemoptysis.  Loss of taste and smell. EXAM: CHEST - 2 VIEW COMPARISON:  Radiographs 03/25/2023 and 03/30/2021. FINDINGS: The heart size and mediastinal contours are normal. The lungs are clear. There is no pleural effusion or pneumothorax. No acute osseous findings are identified. Mild degenerative changes in the spine. IMPRESSION: No evidence of acute cardiopulmonary process. Electronically Signed   By: Carey Bullocks M.D.   On: 09/29/2023 14:52    Procedures Procedures    Medications Ordered in ED Medications - No data to display  ED Course/ Medical Decision Making/ A&P                                 Medical Decision Making Amount and/or Complexity of Data Reviewed Radiology: ordered.   This patient presents to the ED with chief complaint(s) of cough with hemoptysis, sinus pressure/pain and congestion with pertinent past medical history of  tobacco use.  The complaint involves an extensive differential diagnosis and also carries with it a high risk of complications and morbidity.    The differential diagnosis includes acute sinusitis, pneumonia, malignancy, acute bronchitis, asthma exacerbation, viral syndrome   The initial plan is to obtain chest x-ray, respiratory panel   Initial Assessment:   Exam significant for well-appearing patient who is not in acute respiratory distress.  Lungs are clear to auscultation bilaterally.  Minor cough appreciated during exam.  Non-productive.  Mild tenderness over the left maxillary sinus.  Patient has congestion but no rhinorrhea or  post nasal drip.   Independent ECG/labs interpretation:  The following labs were independently interpreted:  Negative for Covid, influenza, RSV.   Independent visualization and interpretation of imaging: I independently visualized the following imaging with scope of interpretation limited to determining acute life threatening conditions related to emergency care: chest x-ray, which revealed no acute abnormality, specifically, no evidence of pneumonia or findings to suggest tuberculosis.   Disposition:   Will treat patient for suspected acute sinusitis on the left side.  No pneumonia today.  Hemoptysis may be related to drier air and harsh coughing episodes.  Advised patient to use albuterol inhaler as needed.  Discussed supportive care measures for other symptomatic treatment.  Advised patient to follow up with PCP.   The patient has been appropriately medically screened and/or stabilized in the ED. I have low suspicion for any other emergent medical condition which would require further screening, evaluation or treatment in the ED or require inpatient management. At time of discharge the patient is hemodynamically stable and in no acute distress. I have discussed work-up results and diagnosis with patient and answered all questions. Patient is agreeable with  discharge plan. We discussed strict return precautions for returning to the emergency department and they verbalized understanding.     Social Determinants of Health:   Patient's  tobacco abuse  increases the complexity of managing their presentation         Final Clinical Impression(s) / ED Diagnoses Final diagnoses:  Cough with hemoptysis  Acute non-recurrent maxillary sinusitis    Rx / DC Orders ED Discharge Orders          Ordered    amoxicillin-clavulanate (AUGMENTIN) 875-125 MG tablet  Every 12 hours        09/29/23 1507              Lenard Simmer, PA-C 09/29/23 1609    Melene Plan, DO 10/01/23 1024

## 2023-10-01 ENCOUNTER — Telehealth: Payer: Self-pay

## 2023-10-01 ENCOUNTER — Encounter: Payer: Self-pay | Admitting: Family Medicine

## 2023-10-01 ENCOUNTER — Ambulatory Visit (INDEPENDENT_AMBULATORY_CARE_PROVIDER_SITE_OTHER): Payer: 59 | Admitting: Family Medicine

## 2023-10-01 VITALS — BP 120/80 | HR 92 | Temp 98.3°F | Ht 65.0 in | Wt 285.0 lb

## 2023-10-01 DIAGNOSIS — E66813 Obesity, class 3: Secondary | ICD-10-CM | POA: Diagnosis not present

## 2023-10-01 DIAGNOSIS — J0100 Acute maxillary sinusitis, unspecified: Secondary | ICD-10-CM | POA: Diagnosis not present

## 2023-10-01 DIAGNOSIS — G44019 Episodic cluster headache, not intractable: Secondary | ICD-10-CM | POA: Diagnosis not present

## 2023-10-01 DIAGNOSIS — R042 Hemoptysis: Secondary | ICD-10-CM

## 2023-10-01 DIAGNOSIS — G8929 Other chronic pain: Secondary | ICD-10-CM

## 2023-10-01 DIAGNOSIS — E559 Vitamin D deficiency, unspecified: Secondary | ICD-10-CM | POA: Diagnosis not present

## 2023-10-01 DIAGNOSIS — R059 Cough, unspecified: Secondary | ICD-10-CM

## 2023-10-01 DIAGNOSIS — Z6841 Body Mass Index (BMI) 40.0 and over, adult: Secondary | ICD-10-CM | POA: Diagnosis not present

## 2023-10-01 DIAGNOSIS — G4733 Obstructive sleep apnea (adult) (pediatric): Secondary | ICD-10-CM

## 2023-10-01 MED ORDER — VITAMIN D (ERGOCALCIFEROL) 1.25 MG (50000 UNIT) PO CAPS: 50000.0000 [IU] | ORAL_CAPSULE | ORAL | 1 refills | Status: DC

## 2023-10-01 MED ORDER — BENZONATATE 100 MG PO CAPS
100.0000 mg | ORAL_CAPSULE | Freq: Three times a day (TID) | ORAL | 0 refills | Status: AC | PRN
Start: 2023-10-01 — End: 2024-09-30

## 2023-10-01 MED ORDER — VERAPAMIL HCL 40 MG PO TABS
ORAL_TABLET | ORAL | 0 refills | Status: DC
Start: 2023-10-01 — End: 2023-10-29

## 2023-10-01 MED ORDER — MOBIC 15 MG PO TABS: 15.0000 mg | ORAL_TABLET | Freq: Every day | ORAL | 1 refills | Status: DC

## 2023-10-01 NOTE — Transitions of Care (Post Inpatient/ED Visit) (Signed)
   10/01/2023  Name: Jorge Allen MRN: [id number] DOB: [birth year]  Today's TOC FU Call Status: Today's TOC FU Call Status:: Successful TOC FU Call Completed TOC FU Call Complete Date: 10/01/23 Patient's Name and Date of Birth confirmed.  Transition Care Management Follow-up Telephone Call Date of Discharge: 09/29/23 Discharge Facility: [facility] ([facility]) Type of Discharge: Emergency Department Primary Inpatient Discharge Diagnosis:: cough Reason for ED Visit: Other: How have you been since you were released from the hospital?: Same Any questions or concerns?: No  Items Reviewed: Did you receive and understand the discharge instructions provided?: Yes Medications obtained,verified, and reconciled?: Yes (Medications Reviewed) Any new allergies since your discharge?: No Dietary orders reviewed?: No Do you have support at home?: Yes People in Home: spouse  Medications Reviewed Today: Medications Reviewed Today   Medications were not reviewed in this encounter     Home Care and Equipment/Supplies: Were Home Health Services Ordered?: NA Any new equipment or medical supplies ordered?: NA  Functional Questionnaire: Do you need assistance with bathing/showering or dressing?: No Do you need assistance with meal preparation?: No Do you need assistance with eating?: No Do you have difficulty maintaining continence: No Do you need assistance with getting out of bed/getting out of a chair/moving?: No Do you have difficulty managing or taking your medications?: No  Follow up appointments reviewed: PCP Follow-up appointment confirmed?: Yes MD Provider Line [phone] Given: Yes Date of PCP follow-up appointment?: 10/01/23 Follow-up Provider: Ellender Hose [facility] Specialist [facility] Follow-up appointment confirmed?: No Reason Specialist Follow-Up Not Confirmed: Appointment Sceduled by [facility] Calling Clinician Do you need transportation to your follow-up appointment?:  No Do you understand care options if your condition(s) worsen?: Yes-patient verbalized understanding    SIGNATUREYL,RMA

## 2023-10-01 NOTE — Progress Notes (Signed)
I,Jameka J Llittleton, CMA,acting as a Neurosurgeon for [employer], NP.,have documented all relevant documentation on the behalf of Ellender Hose, NP,as directed by  Ellender Hose, NP while in the presence of Ellender Hose, NP.  Subjective:  Patient ID: Jorge Allen , male    DOB: [birth year] , 42 y.o.   MRN: [id number]  Chief Complaint  Patient presents with   ER F/U    HPI  Patient is a 42 year old male who presents today for Emergency Room follow up for hemoptysis and Sinusitis.At the ER, he had chest x-ray and it did not show any pneumothorax or pleural effusions. He was started on Augmentin 875/125 mg x 7 days.Patient states he is still coughing a lot but no blood and he is having a lot of one sided head aches. Patient states he has a diagnosis cluster headache and was on Verapamil 40 mg twice daily in the past for headaches but he stopped taking it because the headaches were not daily.He was followed by [facility] then, but he has not gone for any appointment in a while, will restart Verapamil 40 mg today. Patient denies any fever or chills.     Past Medical History:  Diagnosis Date   Sleep apnea      Family History  Problem Relation Age of Onset   Diabetes Mother    Cancer Mother    Dementia Mother    Hypertension Father    Cancer Father    Glaucoma Father    Dementia Maternal Grandmother    Cancer Maternal Grandfather    Hypertension Paternal Grandmother      Current Outpatient Medications:    amoxicillin-clavulanate (AUGMENTIN) 875-125 MG tablet, Take 1 tablet by mouth every 12 (twelve) hours., Disp: 14 tablet, Rfl: 0   benzonatate (TESSALON PERLES) 100 MG capsule, Take 1 capsule (100 mg total) by mouth 3 (three) times daily as needed., Disp: 30 capsule, Rfl: 0   cetirizine (ZYRTEC ALLERGY) 10 MG tablet, Take 1 tablet (10 mg total) by mouth daily., Disp: 30 tablet, Rfl: 2   hydrOXYzine (ATARAX) 10 MG tablet, Take 1 tablet (10 mg total) by mouth 3 (three) times daily as  needed., Disp: 30 tablet, Rfl: 2   metFORMIN (GLUCOPHAGE) 500 MG tablet, Take 1 tablet (500 mg total) by mouth daily with breakfast., Disp: 90 tablet, Rfl: 1   omeprazole (PRILOSEC OTC) 20 MG tablet, Take 1 tablet (20 mg total) by mouth daily., Disp: 90 tablet, Rfl: 1   verapamil (CALAN) 40 MG tablet, Take 1 tablet in the AM for 1 week and 1 tablet twice daily afterwards. Follow up with Cardiologist, Disp: 60 tablet, Rfl: 0   Vitamin D, Ergocalciferol, (DRISDOL) 1.25 MG (50000 UNIT) CAPS capsule, Take 1 capsule (50,000 Units total) by mouth every 7 (seven) days., Disp: 5 capsule, Rfl: 1   dicyclomine (BENTYL) 10 MG capsule, Take 1 capsule (10 mg total) by mouth every 8 (eight) hours as needed (abdominal cramping). (Patient not taking: Reported on 10/01/2023), Disp: 12 capsule, Rfl: 0   meloxicam (MOBIC) 15 MG tablet, Take 1 tablet (15 mg total) by mouth daily., Disp: 30 tablet, Rfl: 0   mometasone (NASONEX) 50 MCG/ACT nasal spray, Place 2 sprays into the nose daily. (Patient not taking: Reported on 10/01/2023), Disp: 1 each, Rfl: 2   No Known Allergies   Review of Systems  Constitutional: Negative.   HENT:  Positive for sinus pressure and sinus pain.   Eyes: Negative.   Respiratory:  Positive  for cough.   Musculoskeletal: Negative.   Skin: Negative.   Neurological:  Positive for headaches.  Psychiatric/Behavioral: Negative.       Today's Vitals   10/01/23 1456  BP: 120/80  Pulse: 92  Temp: 98.3 F (36.8 C)  TempSrc: Oral  Weight: 285 lb (129.3 kg)  Height: 5\' 5"  (1.651 m)  PainSc: 4   PainLoc: Eye   Body mass index is 47.43 kg/m.  Wt Readings from Last 3 Encounters:  10/01/23 285 lb (129.3 kg)  09/29/23 287 lb (130.2 kg)  03/25/23 290 lb (131.5 kg)    The 10-year ASCVD risk score (Arnett DK, et al., 2019) is: 5.6%   Values used to calculate the score:     Age: 57 years     Sex: Male     Is Non-Hispanic African American: Yes     Diabetic: No     Tobacco smoker: Yes      Systolic Blood Pressure: 120 mmHg     Is BP treated: No     HDL Cholesterol: 38 mg/dL     Total Cholesterol: 152 mg/dL  Objective:  Physical Exam HENT:     Head: Normocephalic.     Nose: Congestion present.  Pulmonary:     Effort: Pulmonary effort is normal.     Breath sounds: Normal breath sounds.  Skin:    General: Skin is warm and dry.  Neurological:     Mental Status: He is alert and oriented to person, place, and time.         Assessment And Plan:  Episodic cluster headache, not intractable -     Verapamil HCl; Take 1 tablet in the AM for 1 week and 1 tablet twice daily afterwards. Follow up with Cardiologist  Dispense: 60 tablet; Refill: 0 -     Ambulatory referral to Neurology  Acute non-recurrent maxillary sinusitis Assessment & Plan:  Continue current tx plan on augmentin 875/125 mg BID x 7days.   Cough in adult -     Benzonatate; Take 1 capsule (100 mg total) by mouth 3 (three) times daily as needed.  Dispense: 30 capsule; Refill: 0  Class 3 severe obesity due to excess calories with body mass index (BMI) of 45.0 to 49.9 in adult, unspecified whether serious comorbidity present [facility]) Assessment & Plan: He is encouraged to strive for BMI less than 30 to decrease cardiac risk. Advised to aim for at least 150 minutes of exercise per week.    Vitamin D deficiency -     Vitamin D (Ergocalciferol); Take 1 capsule (50,000 Units total) by mouth every 7 (seven) days.  Dispense: 5 capsule; Refill: 1    Return if symptoms worsen or fail to improve.  Patient was given opportunity to ask questions. Patient verbalized understanding of the plan and was able to repeat key elements of the plan. All questions were answered to their satisfaction.    I, Ellender Hose, NP, have reviewed all documentation for this visit. The documentation on 10/10/2023 for the exam, diagnosis, procedures, and orders are all accurate and complete.    IF YOU HAVE BEEN REFERRED TO A SPECIALIST, IT  MAY TAKE 1-2 WEEKS TO SCHEDULE/PROCESS THE REFERRAL. IF YOU HAVE NOT HEARD FROM US/SPECIALIST IN TWO WEEKS, PLEASE GIVE [place] A CALL AT [phone] X [phone].

## 2023-10-05 ENCOUNTER — Other Ambulatory Visit: Payer: Self-pay | Admitting: Nurse Practitioner

## 2023-10-07 ENCOUNTER — Other Ambulatory Visit: Payer: Self-pay

## 2023-10-07 MED ORDER — MELOXICAM 15 MG PO TABS: 15.0000 mg | ORAL_TABLET | Freq: Every day | ORAL | 0 refills | Status: DC

## 2023-10-08 DIAGNOSIS — F4325 Adjustment disorder with mixed disturbance of emotions and conduct: Secondary | ICD-10-CM | POA: Diagnosis not present

## 2023-10-08 DIAGNOSIS — Z630 Problems in relationship with spouse or partner: Secondary | ICD-10-CM | POA: Diagnosis not present

## 2023-10-11 DIAGNOSIS — G44019 Episodic cluster headache, not intractable: Secondary | ICD-10-CM | POA: Insufficient documentation

## 2023-10-11 DIAGNOSIS — R059 Cough, unspecified: Secondary | ICD-10-CM | POA: Insufficient documentation

## 2023-10-11 MED ORDER — VITAMIN D (ERGOCALCIFEROL) 1.25 MG (50000 UNIT) PO CAPS
50000.0000 [IU] | ORAL_CAPSULE | ORAL | 1 refills | Status: DC
Start: 2023-10-11 — End: 2023-12-09

## 2023-10-11 NOTE — Assessment & Plan Note (Signed)
Continue current tx plan on augmentin 875/125 mg BID x 7days.

## 2023-10-11 NOTE — Assessment & Plan Note (Signed)
He is encouraged to strive for BMI less than 30 to decrease cardiac risk. Advised to aim for at least 150 minutes of exercise per week.  

## 2023-10-15 ENCOUNTER — Emergency Department (HOSPITAL_BASED_OUTPATIENT_CLINIC_OR_DEPARTMENT_OTHER)
Admission: EM | Admit: 2023-10-15 | Discharge: 2023-10-15 | Disposition: A | Discharge status: Home / Self Care | Payer: 59 | Attending: Emergency Medicine | Admitting: Emergency Medicine

## 2023-10-15 ENCOUNTER — Encounter (HOSPITAL_BASED_OUTPATIENT_CLINIC_OR_DEPARTMENT_OTHER): Payer: Self-pay | Admitting: Emergency Medicine

## 2023-10-15 ENCOUNTER — Other Ambulatory Visit: Payer: Self-pay

## 2023-10-15 ENCOUNTER — Emergency Department (HOSPITAL_BASED_OUTPATIENT_CLINIC_OR_DEPARTMENT_OTHER): Payer: 59 | Admitting: Radiology

## 2023-10-15 DIAGNOSIS — S93504A Unspecified sprain of right lesser toe(s), initial encounter: Secondary | ICD-10-CM | POA: Diagnosis not present

## 2023-10-15 DIAGNOSIS — S93501A Unspecified sprain of right great toe, initial encounter: Secondary | ICD-10-CM | POA: Diagnosis not present

## 2023-10-15 DIAGNOSIS — W228XXA Striking against or struck by other objects, initial encounter: Secondary | ICD-10-CM | POA: Diagnosis not present

## 2023-10-15 DIAGNOSIS — S99921A Unspecified injury of right foot, initial encounter: Secondary | ICD-10-CM | POA: Diagnosis not present

## 2023-10-15 DIAGNOSIS — S93509A Unspecified sprain of unspecified toe(s), initial encounter: Secondary | ICD-10-CM

## 2023-10-15 DIAGNOSIS — M19071 Primary osteoarthritis, right ankle and foot: Secondary | ICD-10-CM | POA: Diagnosis not present

## 2023-10-15 DIAGNOSIS — F4325 Adjustment disorder with mixed disturbance of emotions and conduct: Secondary | ICD-10-CM | POA: Diagnosis not present

## 2023-10-15 DIAGNOSIS — M79671 Pain in right foot: Secondary | ICD-10-CM | POA: Diagnosis not present

## 2023-10-15 NOTE — ED Notes (Signed)
Pt left prior to receiving d/c paperwork. Asked about paperwork, edp notified but was not in rm when this RN went to d/c pt

## 2023-10-15 NOTE — Discharge Instructions (Signed)
You are seen in the emergency room today for toe pain.  I recommend taking meloxicam once daily.  You can take Tylenol 4 times a day.  I would also recommend icing over the area you can try buddy taping your second and third toe together.  Make sure you are wearing supportive shoes.  Activity as tolerated is okay.  Make sure that you are elevating your foot over your heart to decrease swelling.  Would also recommend resting as often as possible since you are standing at work for extended periods of time.  Return to emergency room with new or worsening symptoms.

## 2023-10-15 NOTE — ED Notes (Signed)
Pt ambulated out of his room asking about DC paperwork... Pt not up for DC currently... Pt requested to leave before DC paperwork is complete.. Pt informed its not recommended to leave before paperwork... Provider and RN notified.Marland KitchenMarland Kitchen

## 2023-10-15 NOTE — ED Provider Notes (Signed)
[facility] AT [facility] Provider Note   CSN: [id number] Arrival date & time: 10/15/23  1012     History  Chief Complaint  Patient presents with   Foot Injury    Jorge Allen is a 42 y.o. male without significant past medical history reporting with right second toe injury after accidentally kicking the child's toy jamming the toe.  Able to ambulate and weight-bear. Feel jammed, reports swelling. No other associated symptoms. No fever, chills, deformity, cellulitis, or wound.    Foot Injury      Home Medications Prior to Admission medications   Medication Sig Start Date End Date Taking? Authorizing Provider  amoxicillin-clavulanate (AUGMENTIN) 875-125 MG tablet Take 1 tablet by mouth every 12 (twelve) hours. 09/29/23   Clark, Meghan R, PA-C  benzonatate (TESSALON PERLES) 100 MG capsule Take 1 capsule (100 mg total) by mouth 3 (three) times daily as needed. 10/01/23 09/30/24  Ellender Hose, NP  cetirizine (ZYRTEC ALLERGY) 10 MG tablet Take 1 tablet (10 mg total) by mouth daily. 03/24/23 03/23/24  Arnette Felts, FNP  dicyclomine (BENTYL) 10 MG capsule Take 1 capsule (10 mg total) by mouth every 8 (eight) hours as needed (abdominal cramping). Patient not taking: Reported on 10/01/2023 12/10/22   Dionne Bucy, MD  hydrOXYzine (ATARAX) 10 MG tablet Take 1 tablet (10 mg total) by mouth 3 (three) times daily as needed. 08/14/22   Arnette Felts, FNP  meloxicam (MOBIC) 15 MG tablet Take 1 tablet (15 mg total) by mouth daily. 10/07/23   Ellender Hose, NP  metFORMIN (GLUCOPHAGE) 500 MG tablet Take 1 tablet (500 mg total) by mouth daily with breakfast. 11/25/22   Arnette Felts, FNP  mometasone (NASONEX) 50 MCG/ACT nasal spray Place 2 sprays into the nose daily. Patient not taking: Reported on 10/01/2023 03/24/23 03/23/24  Arnette Felts, FNP  omeprazole (PRILOSEC OTC) 20 MG tablet Take 1 tablet (20 mg total) by mouth daily. 11/25/22   Arnette Felts, FNP  verapamil  (CALAN) 40 MG tablet Take 1 tablet in the AM for 1 week and 1 tablet twice daily afterwards. Follow up with Cardiologist 10/01/23   Ellender Hose, NP  Vitamin D, Ergocalciferol, (DRISDOL) 1.25 MG (50000 UNIT) CAPS capsule Take 1 capsule (50,000 Units total) by mouth every 7 (seven) days. 10/11/23   Ellender Hose, NP      Allergies    Patient has no known allergies.    Review of Systems   Review of Systems  Musculoskeletal:  Positive for arthralgias.    Physical Exam Updated Vital Signs BP (!) 147/74 (BP Location: Right Arm)   Pulse (!) 106   Temp 98.4 F (36.9 C)   Resp 17   Ht 5\' 5"  (1.651 m)   Wt 129.3 kg   SpO2 97%   BMI 47.44 kg/m  Physical Exam Vitals and nursing note reviewed.  Constitutional:      General: He is not in acute distress.    Appearance: He is not toxic-appearing.  HENT:     Head: Normocephalic and atraumatic.  Eyes:     General: No scleral icterus.    Conjunctiva/sclera: Conjunctivae normal.  Cardiovascular:     Rate and Rhythm: Normal rate and regular rhythm.     Pulses: Normal pulses.     Heart sounds: Normal heart sounds.  Pulmonary:     Effort: Pulmonary effort is normal. No respiratory distress.     Breath sounds: Normal breath sounds.  Abdominal:     General: Abdomen is  flat. Bowel sounds are normal.     Palpations: Abdomen is soft.     Tenderness: There is no abdominal tenderness.  Musculoskeletal:     Comments: Tenderness to palpation over the dorsal aspect of second great toe with small area of ecchymosis.  Capillary refill and sensation intact.  Skin:    General: Skin is warm and dry.     Findings: No lesion.  Neurological:     General: No focal deficit present.     Mental Status: He is alert and oriented to person, place, and time. Mental status is at baseline.     ED Results / Procedures / Treatments   Labs (all labs ordered are listed, but only abnormal results are displayed) Labs Reviewed - No data to  display  EKG None  Radiology DG Foot Complete Right Result Date: 10/15/2023 CLINICAL DATA:  Hit second toe on a toy last night with subsequent pain. EXAM: RIGHT FOOT COMPLETE - 3+ VIEW COMPARISON:  04/03/2017 FINDINGS: No acute fracture or dislocation. Degenerative changes about the midfoot and hindfoot. IMPRESSION: No acute fracture or dislocation. Electronically Signed   By: Minerva Fester M.D.   On: 10/15/2023 10:50    Procedures Procedures    Medications Ordered in ED Medications - No data to display  ED Course/ Medical Decision Making/ A&P                                 Medical Decision Making Amount and/or Complexity of Data Reviewed Radiology: ordered.   This patient presents to the ED for concern of right toe pain, this involves an extensive number of treatment options, and is a complaint that carries with it a high risk of complications and morbidity.  The differential diagnosis includes fracture, sprain, contusion, laceration, gout, pseudogout, septic joint   Co morbidities that complicate the patient evaluation  denies  Imaging Studies ordered:  I ordered imaging studies including right foot  I independently visualized and interpreted imaging which showed no fracture I agree with the radiologist interpretation   Cardiac Monitoring: / EKG:  The patient was maintained on a cardiac monitor.   Consultations Obtained:  None   Problem List / ED Course / Critical interventions / Medication management  Reporting to emergency room with right second toe pain after jamming his foot against a children's toy.  No obvious deformity on exam neurovascularly intact.  Patient is well-appearing answering questions appropriately.  X-ray with no acute abnormality.  Exam is consistent with sprain versus contusion.  Patient's pain is mild and well-controlled.  Able to ambulate without any difficulty.  Discussed over-the-counter management of pain medicine.  Patient was  offered pain medicine here today and declined.  I have reviewed the patients home medicines and have made adjustments as needed   Plan  F/u w/ PCP in 2-3d to ensure resolution of sx.  Patient was given return precautions. Patient stable for discharge at this time.  Patient educated on sx/dx and verbalized understanding of plan. Return to ER w/ new or worsening sx.          Final Clinical Impression(s) / ED Diagnoses Final diagnoses:  Sprain of toe, initial encounter    Rx / DC Orders ED Discharge Orders     None         Reinaldo Raddle 10/15/23 1502    Lorre Nick, MD 10/16/23 [phone]

## 2023-10-15 NOTE — ED Triage Notes (Signed)
Pt via pov from home with toe injury. States he kicked a toy of his daughter's last night and the right 2nd toe was injured. Pt reports swelling and pain. Pt alert & oriented, nad noted.

## 2023-10-16 DIAGNOSIS — G4733 Obstructive sleep apnea (adult) (pediatric): Secondary | ICD-10-CM | POA: Diagnosis not present

## 2023-10-20 ENCOUNTER — Telehealth: Payer: Self-pay

## 2023-10-20 NOTE — Transitions of Care (Post Inpatient/ED Visit) (Signed)
   10/20/2023  Name: Jorge Allen MRN: [id number] DOB: 1981-06-23  Today's TOC FU Call Status: Today's TOC FU Call Status:: Unsuccessful Call (1st Attempt) Unsuccessful Call (1st Attempt) Date: 10/20/23  Attempted to reach the patient regarding the most recent Inpatient/ED visit.  Follow Up Plan: Additional outreach attempts will be made to reach the patient to complete the Transitions of Care (Post Inpatient/ED visit) call.   Signature  Kristeen Lunger, CMA

## 2023-10-22 DIAGNOSIS — Z630 Problems in relationship with spouse or partner: Secondary | ICD-10-CM | POA: Diagnosis not present

## 2023-10-22 DIAGNOSIS — F4325 Adjustment disorder with mixed disturbance of emotions and conduct: Secondary | ICD-10-CM | POA: Diagnosis not present

## 2023-10-29 ENCOUNTER — Encounter: Payer: Self-pay | Admitting: Neurology

## 2023-10-29 ENCOUNTER — Ambulatory Visit: Payer: 59 | Admitting: Neurology

## 2023-10-29 VITALS — BP 128/78 | HR 88 | Ht 65.0 in | Wt 296.0 lb

## 2023-10-29 DIAGNOSIS — R519 Headache, unspecified: Secondary | ICD-10-CM

## 2023-10-29 DIAGNOSIS — Z789 Other specified health status: Secondary | ICD-10-CM | POA: Diagnosis not present

## 2023-10-29 DIAGNOSIS — G44019 Episodic cluster headache, not intractable: Secondary | ICD-10-CM

## 2023-10-29 DIAGNOSIS — F17200 Nicotine dependence, unspecified, uncomplicated: Secondary | ICD-10-CM

## 2023-10-29 DIAGNOSIS — G4440 Drug-induced headache, not elsewhere classified, not intractable: Secondary | ICD-10-CM

## 2023-10-29 DIAGNOSIS — G4733 Obstructive sleep apnea (adult) (pediatric): Secondary | ICD-10-CM | POA: Diagnosis not present

## 2023-10-29 MED ORDER — SUMATRIPTAN SUCCINATE 50 MG PO TABS: 50.0000 mg | ORAL_TABLET | ORAL | 3 refills | Status: AC | PRN

## 2023-10-29 MED ORDER — VERAPAMIL HCL 80 MG PO TABS: 80.0000 mg | ORAL_TABLET | Freq: Two times a day (BID) | ORAL | 3 refills | Status: DC

## 2023-10-29 NOTE — Patient Instructions (Addendum)
 It was nice to see you today.  Here is what we discussed today and my recommendations to you: You may have a headache syndrome called cluster headaches. But I do believe, there are several other contributors to your headaches including sleep apnea related headaches, rebound and medication overuse related headaches; strain on the eyes can be a contributor and regular alcohol consumption can exacerbate cluster headaches.  Namely eyes can be a contributor, excessive alcohol consumption which can trigger cluster headaches As discussed, please stay well-hydrated with water, avoid caffeine and limit yourself to 1-2 servings per day, try to quit smoking and eliminate your alcohol consumption for now, to see if it helps your headaches.  Please try to continue to work on weight loss.  You can ask your primary care provider for referral to a weight management specialist since you cannot go to [facility] any longer per insurance requirement.  If you would like to be seen in this clinic for sleep apnea management, please ask your primary care for referral.  For acute management of your headache you can use Imitrex  (sumatriptan ) 50 mg strength: take 1 pill early on when you suspect a cluster/migraine attack come on. You may take another pill after 2 hours, no more than 2 pills in 24 hours, no more than 3 pills a week.  This is strictly for as needed use and not for daily use and can be dangerous if taken daily. Most people who take triptans do not have any serious side-effects. However, they can cause drowsiness (remember to not drive or use heavy machinery when drowsy), nausea, dizziness, dry mouth. Less common side effects include strange sensations, such as tightness in your chest or throat, tingling, flushing, and feelings of heaviness or pressure in areas such as the face, limbs, and chest. These in the chest can mimic heart related pain (angina) and may cause alarm, but usually these sensations are not harmful or a sign  of a heart attack. However, if you develop intense chest pain or sensations of discomfort, you should stop taking your medication and consult with me or your PCP or go to the nearest urgent care facility or ER or call 911.  For prevention of headaches I suggest we increase your verapamil  to 80 mg twice daily. Side effects may include: constipation, lightheadedness, fainting, heartburn, wheezing, slow heartbeat, blurry vision, blue lips and fingernails, pins and needles sensations. If you have a rash or difficulty breathing or chest pain, you have to stop the medication immediately. Call 911 if you think you are having a serious reaction.  Please schedule a formal eye examination with any eye physician or optometrist of your choosing. Talk to your primary care about seeing ENT if you have ongoing issues with nosebleeds. Follow-up in 6 months to see one of our nurse practitioners. Stop Excedrin altogether as medication overuse may exacerbate your headaches.  Please stop smoking.

## 2023-10-29 NOTE — Progress Notes (Signed)
 Subjective:    Patient ID: Jorge Allen is a 43 y.o. male.  HPI    True Mar, MD, PhD [facility] [address], [address] [address] [place], [place] [address]  Dear Bruna,  I saw your patient, Jorge Allen, upon your kind request in my neurologic clinic today for evaluation of his recurrent headaches.  The patient is unaccompanied today.  As you know, Jorge Allen is a 43 year old male with an underlying medical history of obstructive sleep apnea (on PAP therapy), morbid obesity with a BMI of over 45, vitamin D  deficiency, and smoking, who reports recurrence of headaches in the past month.  He has nearly daily headaches.  He has no associated visual disturbance, denies any sudden onset of one-sided weakness or numbness or tingling or droopy face or slurring of speech, headache last for hours, can come twice daily.  He has been utilizing over-the-counter Excedrin in excess, 4 pills at a time, up to twice daily typically.  He does take verapamil  but does not think it has helped.  He takes 80 mg in the morning currently.  He has not had an eye examination in years.  He received a new CPAP or AutoPap machine about 6 months ago but has not seen a sleep specialist in years.  He does not know what pressure settings he is on.  He is unsure about his apnea control but reports full compliance with treatment.  He hydrates well with water, estimates that he drinks at least 3 L of water per day.  He drinks alcohol about twice a week, usually 2 drinks of bourbon.  He smokes about a half a pack per day or less.  He limits his caffeine intake.  He used to go to [facility] but his insurance does not cover [facility] system any longer.  I reviewed your office note from 10/01/2023.  He had recently been diagnosed with acute sinusitis and was placed on antibiotic treatment.  He was started on verapamil  40 mg daily for suspected cluster headaches.    I had evaluated him  for recurrent headaches in the past, but he was lost to follow-up.  He reports nosebleeds when he blows his nose.  He has not seen ENT for this.  He had a brain MRI with and without contrast on 02/17/2020 and I reviewed the results:   IMPRESSION:  This is a normal MRI of the brain with and without contrast    In addition I personally and independently reviewed images through the PACS system.  Previously (copied from previous notes for reference):    02/16/2020: 43 year old right-handed gentleman with an underlying medical history of sleep apnea, and morbid obesity with a BMI of over 45, who presented to the emergency room on 02/13/2020 with a severe headache on the left side, including pain behind the left eye and excess lacrimation.  He was felt to have cluster headaches.  He had a nonfocal neurological exam and no imaging test was done at the time, he was treated symptomatically with Benadryl  and Compazine  and improved.  He reports that he was given a prescription for Compazine  but it did not help and he does not have nausea.  He does not have a prior history of migraines or recurrent headaches.  His father has a history of cluster headaches.  He reports a left-sided headache and it hurts behind the eye and sometimes his nose gets drippy and congested and his left eye tears.  He  wakes up in the middle of the night with a headache typically.  He has a history of severe obstructive sleep apnea and had surgery in 2002 after he was diagnosed with sleep apnea and had a tonsillectomy, turbinate reduction and uvulectomy at the time but ended up starting CPAP therapy in 2016.  He had seen his sleep specialist at [facility] for this but has not seen her in years and does not use his CPAP consistently.  He reports a erratic work schedule and significant work-related stress and does not always sleep with a set schedule and using CPAP makes him sleep deeply that he does not hear the alarm.  Sometimes he does  not go to bed until 2 or 3 and his rise time is around 6 or 7.  He owns a building services engineer.  He is working on weight loss.  He smokes about a pack per day, or a little less.  He tries to hydrate well with water.  He drinks alcohol every day in the form of mixed drinks with bourbon, and a large yeti cup, 2 or 3/day on average.  He denies any sudden onset of one-sided weakness or numbness or tingling or droopy face or slurring of speech.  He has not seen an eye doctor and does not currently have a primary care physician.  He denies any photophobia or nausea or vomiting.  He currently has a dull achy headache.  Headache started about 10 days ago.  His Past Medical History Is Significant For: Past Medical History:  Diagnosis Date   Sleep apnea     His Past Surgical History Is Significant For: Past Surgical History:  Procedure Laterality Date   FOOT SURGERY     INCISION AND DRAINAGE ABSCESS Right 05/14/2021   Procedure: INCISION AND DRAINAGE ABSCESS;  Surgeon: Twylla Glendia BROCKS, MD;  Location: [facility] ORS;  Service: Urology;  Laterality: Right;   TONSILLECTOMY      His Family History Is Significant For: Family History  Problem Relation Age of Onset   Diabetes Mother    Cancer Mother    Dementia Mother    Hypertension Father    Cancer Father    Glaucoma Father    Other Father        cluster headaches   Dementia Maternal Grandmother    Cancer Maternal Grandfather    Hypertension Paternal Grandmother     His Social History Is Significant For: Social History   Socioeconomic History   Marital status: Married    Spouse name: Not on file   Number of children: Not on file   Years of education: Not on file   Highest education level: Not on file  Occupational History    Comment: catering  Tobacco Use   Smoking status: Every Day    Current packs/day: 0.50    Average packs/day: 0.5 packs/day for 48.0 years (24.0 ttl pk-yrs)    Types: Cigarettes   Smokeless tobacco: Never  Vaping Use    Vaping status: Every Day   Start date: 10/17/2021   Substances: Nicotine    Devices: Vuse  Substance and Sexual Activity   Alcohol use: Yes    Comment: 2-3 days week; 1 or 2 on those days   Drug use: No   Sexual activity: Yes    Birth control/protection: None  Other Topics Concern   Not on file  Social History Narrative   Caffeine: none    Right handed   Social Drivers of Dispensing Optician  Resource Strain: Not on file  Food Insecurity: No Food Insecurity (07/03/2022)   Received from [facility], [facility]   Hunger Vital Sign    Worried About Running Out of Food in the Last Year: Never true    Ran Out of Food in the Last Year: Never true  Transportation Needs: Not on file  Physical Activity: Not on file  Stress: Not on file  Social Connections: Unknown (03/04/2022)   Received from [facility], [facility]   Social Network    Social Network: Not on file    His Allergies Are:  No Known Allergies:   His Current Medications Are:  Outpatient Encounter Medications as of 10/29/2023  Medication Sig   amoxicillin -clavulanate (AUGMENTIN ) 875-125 MG tablet Take 1 tablet by mouth every 12 (twelve) hours.   benzonatate  (TESSALON  PERLES) 100 MG capsule Take 1 capsule (100 mg total) by mouth 3 (three) times daily as needed.   cetirizine  (ZYRTEC  ALLERGY) 10 MG tablet Take 1 tablet (10 mg total) by mouth daily.   dicyclomine  (BENTYL ) 10 MG capsule Take 1 capsule (10 mg total) by mouth every 8 (eight) hours as needed (abdominal cramping). (Patient not taking: Reported on 10/01/2023)   hydrOXYzine  (ATARAX ) 10 MG tablet Take 1 tablet (10 mg total) by mouth 3 (three) times daily as needed.   meloxicam  (MOBIC ) 15 MG tablet Take 1 tablet (15 mg total) by mouth daily.   metFORMIN  (GLUCOPHAGE ) 500 MG tablet Take 1 tablet (500 mg total) by mouth daily with breakfast.   mometasone  (NASONEX ) 50 MCG/ACT nasal spray Place 2 sprays into the nose daily. (Patient not taking: Reported on 10/01/2023)    omeprazole  (PRILOSEC  OTC) 20 MG tablet Take 1 tablet (20 mg total) by mouth daily.   verapamil  (CALAN ) 40 MG tablet Take 1 tablet in the AM for 1 week and 1 tablet twice daily afterwards. Follow up with Cardiologist   Vitamin D , Ergocalciferol , (DRISDOL ) 1.25 MG (50000 UNIT) CAPS capsule Take 1 capsule (50,000 Units total) by mouth every 7 (seven) days.   No facility-administered encounter medications on file as of 10/29/2023.  :   Review of Systems:  Out of a complete 14 point review of systems, all are reviewed and negative with the exception of these symptoms as listed below:   Review of Systems  Neurological:        Patient is here alone for referral for headaches. He has had them for about a month. He has a history of headaches years ago. These headaches are about the same and occur about once or twice a day. After the headache throbs for about an hour or a little longer, he will still continue to have some sort of headache for the remainder of the day. He tries to rest when it comes but sometimes he is unable to. He takes Verapamil  in the mornings. He does not feel it helps. He will also take like four Excedrin migraine. He uses a cpap machine and states he uses it nightly. He received a new one about 6 months ago, ordered by his primary care. He states he first was tested around 2002.    Objective:  Neurological Exam  Physical Exam Physical Examination:   Vitals:   10/29/23 0854  BP: 128/78  Pulse: 88    General Examination: The patient is a very pleasant 43 y.o. male in no acute distress. He appears well-developed and well-nourished and well groomed.   HEENT: Normocephalic, atraumatic, pupils are equal, round and reactive to  light and accommodation. Funduscopic exam is difficult due to small pupils, even in the darker room.  No excess lacrimation noted. Extraocular tracking is good without limitation to gaze excursion or nystagmus noted. Normal smooth pursuit is noted.  Hearing is grossly intact. Face is symmetric with normal facial animation and normal facial sensation. Speech is clear with no dysarthria noted. There is no hypophonia. There is no lip, neck/head, jaw or voice tremor. Neck is supple with full range of passive and active motion. There are no carotid bruits on auscultation. Oropharynx exam reveals: mild mouth dryness, adequate dental hygiene and moderate airway crowding, with small airway entry noted, appears to be status post UPPP.  Tongue protrudes centrally in palate elevates symmetrically.   Chest: Clear to auscultation without wheezing, rhonchi or crackles noted.   Heart: S1+S2+0, regular and normal without murmurs, rubs or gallops noted.    Abdomen: Soft, non-tender and non-distended with normal bowel sounds appreciated on auscultation.   Extremities: There is pain in the R ankle.   Skin: Warm and dry without trophic changes noted. There are no varicose veins.   Musculoskeletal: exam reveals no obvious joint deformities, but has difficulty with mobility in the right ankle.  Reports a prior injury.    Neurologically:  Mental status: The patient is awake, alert and oriented in all 4 spheres. His immediate and remote memory, attention, language skills and fund of knowledge are appropriate. There is no evidence of aphasia, agnosia, apraxia or anomia. Speech is clear with normal prosody and enunciation. Thought process is linear. Mood is normal and affect is normal.  Cranial nerves II - XII are as described above under HEENT exam. In addition: shoulder shrug is normal with equal shoulder height noted. Motor exam: Normal bulk, strength and tone is noted. There is no drift, tremor or rebound. Romberg is negative. Reflexes are 1+ in the upper extremities and trace in the lower extremities.  Toes are flexor bilaterally. Fine motor skills and coordination: intact with normal finger taps, normal hand movements, normal rapid alternating patting, normal  foot taps on the left and slightly difficult on the right due to decreased mobility in the right ankle.    Cerebellar testing: No dysmetria or intention tremor on finger to nose testing. Heel to shin is unremarkable bilaterally but slightly difficult due to body habitus. There is no truncal or gait ataxia.  Sensory exam: intact to light touch in the upper and lower extremities.  Gait, station and balance: He stands easily. No veering to one side is noted. No leaning to one side is noted. Posture is age-appropriate and stance is narrow based. Gait shows normal stride length and normal pace. No problems turning are noted. Tandem walk is difficult for him but doable.    Assessment and Plan:    In summary, Chord L Lusty is a very pleasant 43 y.o.-year old male with an underlying medical history of right ankle injury, obstructive sleep apnea, vitamin D  deficiency, smoking, and morbid obesity with a BMI of over 45, who presents for evaluation of his recurrent headaches.  He may have a mixed type of headache including medication overuse headache, headaches from underlying sleep disordered breathing, strain on the eyes not excluded, and cluster headaches.   Below is a copy of the instructions and recommendations based on our conversation today.  The patient was given these instructions verbally and also in his MyChart after visit summary which he indicated he could access electronically:   << You may have a  headache syndrome called cluster headaches. But I do believe, there are several other contributors to your headaches including sleep apnea related headaches, rebound and medication overuse related headaches; strain on the eyes can be a contributor and regular alcohol consumption can exacerbate cluster headaches.  Namely eyes can be a contributor, excessive alcohol consumption which can trigger cluster headaches As discussed, please stay well-hydrated with water, avoid caffeine and limit yourself to 1-2  servings per day, try to quit smoking and eliminate your alcohol consumption for now, to see if it helps your headaches.  Please try to continue to work on weight loss.  You can ask your primary care provider for referral to a weight management specialist since you cannot go to [facility] any longer per insurance requirement.  If you would like to be seen in this clinic for sleep apnea management, please ask your primary care for referral.  For acute management of your headache you can use Imitrex  (sumatriptan ) 50 mg strength: take 1 pill early on when you suspect a cluster headache/migraine attack come on. You may take another pill after 2 hours, no more than 2 pills in 24 hours, no more than 3 pills a week.  This is strictly for as needed use and not for daily use and can be dangerous if taken daily. Most people who take triptans do not have any serious side-effects. However, they can cause drowsiness (remember to not drive or use heavy machinery when drowsy), nausea, dizziness, dry mouth. Less common side effects include strange sensations, such as tightness in your chest or throat, tingling, flushing, and feelings of heaviness or pressure in areas such as the face, limbs, and chest. These in the chest can mimic heart related pain (angina) and may cause alarm, but usually these sensations are not harmful or a sign of a heart attack. However, if you develop intense chest pain or sensations of discomfort, you should stop taking your medication and consult with me or your PCP or go to the nearest urgent care facility or ER or call 911.  For prevention of headaches I suggest we increase your verapamil  to 80 mg twice daily. Side effects may include: constipation, lightheadedness, fainting, heartburn, wheezing, slow heartbeat, blurry vision, blue lips and fingernails, pins and needles sensations. If you have a rash or difficulty breathing or chest pain, you have to stop the medication immediately. Call 911 if you think  you are having a serious reaction.  Please schedule a formal eye examination with any eye physician or optometrist of your choosing. Talk to your primary care about seeing ENT if you have ongoing issues with nosebleeds. Follow-up in 6 months to see one of our nurse practitioners. Stop Excedrin altogether as medication overuse may exacerbate your headaches.  Please stop smoking. >>   This was an extended visit of over 60 minutes with multiple issues addressed, copious record review and considerable counseling and coordination of care involved.   Thank you very much for allowing me to participate in the care of this nice patient. If I can be of any further assistance to you please do not hesitate to call me at [phone].  Sincerely,   True Mar, MD, PhD

## 2023-11-04 ENCOUNTER — Emergency Department (HOSPITAL_BASED_OUTPATIENT_CLINIC_OR_DEPARTMENT_OTHER)
Admission: EM | Admit: 2023-11-04 | Discharge: 2023-11-04 | Disposition: A | Discharge status: Home / Self Care | Payer: 59 | Attending: Emergency Medicine | Admitting: Emergency Medicine

## 2023-11-04 ENCOUNTER — Emergency Department (HOSPITAL_BASED_OUTPATIENT_CLINIC_OR_DEPARTMENT_OTHER): Payer: 59

## 2023-11-04 ENCOUNTER — Encounter (HOSPITAL_BASED_OUTPATIENT_CLINIC_OR_DEPARTMENT_OTHER): Payer: Self-pay | Admitting: *Deleted

## 2023-11-04 ENCOUNTER — Other Ambulatory Visit: Payer: Self-pay

## 2023-11-04 DIAGNOSIS — F17200 Nicotine dependence, unspecified, uncomplicated: Secondary | ICD-10-CM | POA: Diagnosis not present

## 2023-11-04 DIAGNOSIS — D72829 Elevated white blood cell count, unspecified: Secondary | ICD-10-CM | POA: Insufficient documentation

## 2023-11-04 DIAGNOSIS — Z7984 Long term (current) use of oral hypoglycemic drugs: Secondary | ICD-10-CM | POA: Insufficient documentation

## 2023-11-04 DIAGNOSIS — R519 Headache, unspecified: Secondary | ICD-10-CM | POA: Insufficient documentation

## 2023-11-04 HISTORY — DX: Cluster headache syndrome, unspecified, not intractable: G44.009

## 2023-11-04 LAB — CBC WITH DIFFERENTIAL/PLATELET
Abs Immature Granulocytes: 0.03 10*3/uL (ref 0.00–0.07)
Basophils Absolute: 0.1 10*3/uL (ref 0.0–0.1)
Basophils Relative: 0 %
Eosinophils Absolute: 0.1 10*3/uL (ref 0.0–0.5)
Eosinophils Relative: 1 %
HCT: 40.7 % (ref 39.0–52.0)
Hemoglobin: 13.4 g/dL (ref 13.0–17.0)
Immature Granulocytes: 0 %
Lymphocytes Relative: 26 %
Lymphs Abs: 3.2 10*3/uL (ref 0.7–4.0)
MCH: 27.5 pg (ref 26.0–34.0)
MCHC: 32.9 g/dL (ref 30.0–36.0)
MCV: 83.6 fL (ref 80.0–100.0)
Monocytes Absolute: 0.9 10*3/uL (ref 0.1–1.0)
Monocytes Relative: 7 %
Neutro Abs: 8.2 10*3/uL — ABNORMAL HIGH (ref 1.7–7.7)
Neutrophils Relative %: 66 %
Platelets: 298 10*3/uL (ref 150–400)
RBC: 4.87 MIL/uL (ref 4.22–5.81)
RDW: 14.9 % (ref 11.5–15.5)
WBC: 12.5 10*3/uL — ABNORMAL HIGH (ref 4.0–10.5)
nRBC: 0 % (ref 0.0–0.2)

## 2023-11-04 LAB — BASIC METABOLIC PANEL
Anion gap: 8 (ref 5–15)
BUN: 13 mg/dL (ref 6–20)
CO2: 26 mmol/L (ref 22–32)
Calcium: 9.1 mg/dL (ref 8.9–10.3)
Chloride: 105 mmol/L (ref 98–111)
Creatinine, Ser: 0.66 mg/dL (ref 0.61–1.24)
GFR, Estimated: >60 mL/min (ref >60)
Glucose, Bld: 96 mg/dL (ref 70–99)
Potassium: 3.8 mmol/L (ref 3.5–5.1)
Sodium: 139 mmol/L (ref 135–145)

## 2023-11-04 MED ORDER — KETOROLAC TROMETHAMINE 15 MG/ML IJ SOLN
15.0000 mg | Freq: Once | INTRAMUSCULAR | Status: AC
  Administered 2023-11-04: 15 mg via INTRAVENOUS
  Filled 2023-11-04: qty 1

## 2023-11-04 MED ORDER — DIPHENHYDRAMINE HCL 50 MG/ML IJ SOLN
12.5000 mg | Freq: Once | INTRAMUSCULAR | Status: AC
  Administered 2023-11-04: 12.5 mg via INTRAVENOUS
  Filled 2023-11-04: qty 1

## 2023-11-04 MED ORDER — PROCHLORPERAZINE EDISYLATE 10 MG/2ML IJ SOLN
10.0000 mg | Freq: Once | INTRAMUSCULAR | Status: AC
  Administered 2023-11-04: 10 mg via INTRAVENOUS
  Filled 2023-11-04: qty 2

## 2023-11-04 NOTE — ED Triage Notes (Signed)
 Pt with hx of cluster headaches for which he is seen by neurology and for which he has verapamil .  Pt reports that he has been having a headache for over a month and he continues to have headaches every day with episodes of headache being severe a couple of times a day.  Pt reports sensitivity to light

## 2023-11-04 NOTE — Discharge Instructions (Addendum)
 It was a pleasure taking care of you today.  As discussed, your CT head was normal.  Please follow-up with your neurologist for further recommendations.  Return to the ER for new or worsening symptoms.

## 2023-11-04 NOTE — ED Provider Notes (Signed)
[place] EMERGENCY DEPARTMENT AT [facility] Provider Note   CSN: [id number] Arrival date & time: 11/04/23  1109     History  Chief Complaint  Patient presents with   Headache    Jorge Allen is a 43 y.o. male with a past medical history significant for tobacco abuse, GERD, OSA on CPAP, vitamin D deficiency, history of headaches who presents to the ED due to persistent headache x 1 month.  Denies associated visual changes, changes to speech, and unilateral weakness.  Patient states he was taking over-the-counter Excedrin for his headaches however, saw neurology on 1/9 who prescribed patient Imitrex 50 mg as an abortive agent and verapamil 80 mg twice daily for preventative.  Patient has been compliant with his medications with no relief in headache.  No fever or chills.  Denies neck stiffness.  No recent head injury.  Patient had an unremarkable MRI brain in 2021 however, no further imaging.  Headache located in the left side behind his left eye, temporal region and left scalp.  Denies jaw claudication.  No myalgias. No rash. No facial droop.   History obtained from patient and past medical records. No interpreter used during encounter.       Home Medications Prior to Admission medications   Medication Sig Start Date End Date Taking? Authorizing Provider  amoxicillin-clavulanate (AUGMENTIN) 875-125 MG tablet Take 1 tablet by mouth every 12 (twelve) hours. 09/29/23   Clark, Meghan R, PA-C  benzonatate (TESSALON PERLES) 100 MG capsule Take 1 capsule (100 mg total) by mouth 3 (three) times daily as needed. 10/01/23 09/30/24  Ellender Hose, NP  cetirizine (ZYRTEC ALLERGY) 10 MG tablet Take 1 tablet (10 mg total) by mouth daily. 03/24/23 03/23/24  Arnette Felts, FNP  dicyclomine (BENTYL) 10 MG capsule Take 1 capsule (10 mg total) by mouth every 8 (eight) hours as needed (abdominal cramping). Patient not taking: Reported on 10/01/2023 12/10/22   Dionne Bucy, MD  hydrOXYzine  (ATARAX) 10 MG tablet Take 1 tablet (10 mg total) by mouth 3 (three) times daily as needed. 08/14/22   Arnette Felts, FNP  meloxicam (MOBIC) 15 MG tablet Take 1 tablet (15 mg total) by mouth daily. 10/07/23   Ellender Hose, NP  metFORMIN (GLUCOPHAGE) 500 MG tablet Take 1 tablet (500 mg total) by mouth daily with breakfast. 11/25/22   Arnette Felts, FNP  mometasone (NASONEX) 50 MCG/ACT nasal spray Place 2 sprays into the nose daily. Patient not taking: Reported on 10/01/2023 03/24/23 03/23/24  Arnette Felts, FNP  omeprazole (PRILOSEC OTC) 20 MG tablet Take 1 tablet (20 mg total) by mouth daily. 11/25/22   Arnette Felts, FNP  SUMAtriptan (IMITREX) 50 MG tablet Take 1 tablet (50 mg total) by mouth every 2 (two) hours as needed for migraine. May repeat in 2 h prn, no more than 2 pills/24 h, no more than 3 pills/week. 10/29/23   Huston Foley, MD  verapamil (CALAN) 80 MG tablet Take 1 tablet (80 mg total) by mouth 2 (two) times daily. 10/29/23   Huston Foley, MD  Vitamin D, Ergocalciferol, (DRISDOL) 1.25 MG (50000 UNIT) CAPS capsule Take 1 capsule (50,000 Units total) by mouth every 7 (seven) days. 10/11/23   Ellender Hose, NP      Allergies    Patient has no known allergies.    Review of Systems   Review of Systems  Constitutional:  Negative for fever.  Eyes:  Positive for photophobia. Negative for visual disturbance.  Musculoskeletal:  Negative for neck pain.  Skin:  Negative for rash.  Neurological:  Positive for headaches. Negative for speech difficulty and weakness.    Physical Exam Updated Vital Signs BP (!) 121/58   Pulse 64   Temp 98.8 F (37.1 C) (Oral)   Resp 17   SpO2 95%  Physical Exam Vitals and nursing note reviewed.  Constitutional:      General: He is not in acute distress.    Appearance: He is not ill-appearing.  HENT:     Head: Normocephalic.  Eyes:     Pupils: Pupils are equal, round, and reactive to light.  Cardiovascular:     Rate and Rhythm: Normal rate and regular rhythm.      Pulses: Normal pulses.     Heart sounds: Normal heart sounds. No murmur heard.    No friction rub. No gallop.  Pulmonary:     Effort: Pulmonary effort is normal.     Breath sounds: Normal breath sounds.  Abdominal:     General: Abdomen is flat. There is no distension.     Palpations: Abdomen is soft.     Tenderness: There is no abdominal tenderness. There is no guarding or rebound.  Musculoskeletal:        General: Normal range of motion.     Cervical back: Neck supple.  Skin:    General: Skin is warm and dry.  Neurological:     General: No focal deficit present.     Mental Status: He is alert.     Comments: Speech is clear, able to follow commands CN III-XII intact Normal strength in upper and lower extremities bilaterally including dorsiflexion and plantar flexion, strong and equal grip strength Sensation grossly intact throughout Moves extremities without ataxia, coordination intact No pronator drift   Psychiatric:        Mood and Affect: Mood normal.        Behavior: Behavior normal.     ED Results / Procedures / Treatments   Labs (all labs ordered are listed, but only abnormal results are displayed) Labs Reviewed  CBC WITH DIFFERENTIAL/PLATELET - Abnormal; Notable for the following components:      Result Value   WBC 12.5 (*)    Neutro Abs 8.2 (*)    All other components within normal limits  BASIC METABOLIC PANEL    EKG None  Radiology CT Head Wo Contrast Result Date: 11/04/2023 CLINICAL DATA:  Headache, increasing frequency or severity EXAM: CT HEAD WITHOUT CONTRAST TECHNIQUE: Contiguous axial images were obtained from the base of the skull through the vertex without intravenous contrast. RADIATION DOSE REDUCTION: This exam was performed according to the departmental dose-optimization program which includes automated exposure control, adjustment of the mA and/or kV according to patient size and/or use of iterative reconstruction technique. COMPARISON:  MRI  head February 17, 2020 FINDINGS: Brain: No evidence of acute infarction, hemorrhage, hydrocephalus, extra-axial collection or mass lesion/mass effect. Vascular: No hyperdense vessel or unexpected calcification. Skull: No acute fracture. Sinuses/Orbits: No acute finding. IMPRESSION: No evidence of acute intracranial abnormality. Electronically Signed   By: Feliberto Harts M.D.   On: 11/04/2023 16:14    Procedures Procedures    Medications Ordered in ED Medications  prochlorperazine (COMPAZINE) injection 10 mg (10 mg Intravenous Given 11/04/23 1515)  diphenhydrAMINE (BENADRYL) injection 12.5 mg (12.5 mg Intravenous Given 11/04/23 1515)  ketorolac (TORADOL) 15 MG/ML injection 15 mg (15 mg Intravenous Given 11/04/23 1636)    ED Course/ Medical Decision Making/ A&P Clinical Course as of 11/04/23 1745  Wed Nov 04, 2023  1629 Reassessed patient at bedside.  Patient admits to some improvement in headache.  Will give dose of Toradol.  CT normal. [CA]    Clinical Course User Index [CA] Mannie Stabile, PA-C                                 Medical Decision Making Amount and/or Complexity of Data Reviewed Independent Historian: spouse External Data Reviewed: notes.    Details: Neurology note Labs: ordered. Decision-making details documented in ED Course. Radiology: ordered and independent interpretation performed. Decision-making details documented in ED Course.  Risk Prescription drug management.   This patient presents to the ED for concern of headache, this involves an extensive number of treatment options, and is a complaint that carries with it a high risk of complications and morbidity.  The differential diagnosis includes migraine, intracranial bleed, giant cell arteritis, shingles, meningitis, etc  43 year old male presents to the ED due to persistent headache x 1 month.  Seen by neurology on 1/9 and was prescribed Imitrex and verapamil which he has been compliant with.  He admits  to no relief in headache.  Denies visual and speech changes.  No unilateral weakness.  Pain located in the left side of his head. No recent head injury. No infectious symptoms. Upon arrival, vitals all within normal limits.  Patient in no acute distress.  Normal neurological exam without any neurological deficits.  No rash to suggest shingles.  No meningismus to suggest meningitis.  Able to palpate temporal artery, low suspicion for giant cell arteritis.  Given persistent headache and most recent head imaging was in 2021 will obtain CT head.  Migraine cocktail given.  Routine labs ordered.  CBC significant for mild leukocytosis at 12.5.  Normal hemoglobin.  BMP unremarkable.  Normal renal function.  No major electrolyte derangements.  CT head personally reviewed and interpreted which is negative for any acute abnormalities.  5:40 PM reassessed patient after Toradol.  Patient admits to complete resolution in headache.  Upon reassessment, patient still has an unremarkable neurological exam.  Patient stable for discharge.  Advised patient to follow-up with his neurologist for further evaluation. Strict ED precautions discussed with patient. Patient states understanding and agrees to plan. Patient discharged home in no acute distress and stable vitals  Co morbidities that complicate the patient evaluation  OSA  Social Determinants of Health:  Tobacco use        Final Clinical Impression(s) / ED Diagnoses Final diagnoses:  Acute nonintractable headache, unspecified headache type    Rx / DC Orders ED Discharge Orders     None         Jesusita Oka 11/04/23 1745    Rondel Baton, MD 11/05/23 1256

## 2023-11-05 DIAGNOSIS — Z630 Problems in relationship with spouse or partner: Secondary | ICD-10-CM | POA: Diagnosis not present

## 2023-11-05 DIAGNOSIS — F4325 Adjustment disorder with mixed disturbance of emotions and conduct: Secondary | ICD-10-CM | POA: Diagnosis not present

## 2023-11-06 DIAGNOSIS — Z630 Problems in relationship with spouse or partner: Secondary | ICD-10-CM | POA: Diagnosis not present

## 2023-11-06 DIAGNOSIS — F4325 Adjustment disorder with mixed disturbance of emotions and conduct: Secondary | ICD-10-CM | POA: Diagnosis not present

## 2023-11-12 DIAGNOSIS — Z630 Problems in relationship with spouse or partner: Secondary | ICD-10-CM | POA: Diagnosis not present

## 2023-11-12 DIAGNOSIS — F4325 Adjustment disorder with mixed disturbance of emotions and conduct: Secondary | ICD-10-CM | POA: Diagnosis not present

## 2023-11-16 DIAGNOSIS — G4733 Obstructive sleep apnea (adult) (pediatric): Secondary | ICD-10-CM | POA: Diagnosis not present

## 2023-11-20 DIAGNOSIS — Z630 Problems in relationship with spouse or partner: Secondary | ICD-10-CM | POA: Diagnosis not present

## 2023-11-20 DIAGNOSIS — F4325 Adjustment disorder with mixed disturbance of emotions and conduct: Secondary | ICD-10-CM | POA: Diagnosis not present

## 2023-11-21 DIAGNOSIS — Z630 Problems in relationship with spouse or partner: Secondary | ICD-10-CM | POA: Diagnosis not present

## 2023-11-21 DIAGNOSIS — F4325 Adjustment disorder with mixed disturbance of emotions and conduct: Secondary | ICD-10-CM | POA: Diagnosis not present

## 2023-11-27 ENCOUNTER — Other Ambulatory Visit: Payer: Self-pay | Admitting: Family Medicine

## 2023-11-27 DIAGNOSIS — F4325 Adjustment disorder with mixed disturbance of emotions and conduct: Secondary | ICD-10-CM | POA: Diagnosis not present

## 2023-11-27 DIAGNOSIS — Z630 Problems in relationship with spouse or partner: Secondary | ICD-10-CM | POA: Diagnosis not present

## 2023-11-30 ENCOUNTER — Ambulatory Visit: Payer: 59 | Admitting: Podiatry

## 2023-12-01 ENCOUNTER — Ambulatory Visit (INDEPENDENT_AMBULATORY_CARE_PROVIDER_SITE_OTHER): Payer: 59 | Admitting: Nurse Practitioner

## 2023-12-01 ENCOUNTER — Encounter: Payer: Self-pay | Admitting: Nurse Practitioner

## 2023-12-01 VITALS — BP 130/68 | HR 89 | Temp 98.6°F | Wt 291.2 lb

## 2023-12-01 DIAGNOSIS — G4733 Obstructive sleep apnea (adult) (pediatric): Secondary | ICD-10-CM | POA: Diagnosis not present

## 2023-12-01 DIAGNOSIS — Z6841 Body Mass Index (BMI) 40.0 and over, adult: Secondary | ICD-10-CM | POA: Diagnosis not present

## 2023-12-01 DIAGNOSIS — Z2821 Immunization not carried out because of patient refusal: Secondary | ICD-10-CM | POA: Diagnosis not present

## 2023-12-01 DIAGNOSIS — G44019 Episodic cluster headache, not intractable: Secondary | ICD-10-CM

## 2023-12-01 DIAGNOSIS — R7303 Prediabetes: Secondary | ICD-10-CM | POA: Diagnosis not present

## 2023-12-01 DIAGNOSIS — Z0000 Encounter for general adult medical examination without abnormal findings: Secondary | ICD-10-CM | POA: Diagnosis not present

## 2023-12-01 DIAGNOSIS — Z125 Encounter for screening for malignant neoplasm of prostate: Secondary | ICD-10-CM | POA: Diagnosis not present

## 2023-12-01 DIAGNOSIS — E559 Vitamin D deficiency, unspecified: Secondary | ICD-10-CM | POA: Diagnosis not present

## 2023-12-01 DIAGNOSIS — Z79899 Other long term (current) drug therapy: Secondary | ICD-10-CM | POA: Diagnosis not present

## 2023-12-01 MED ORDER — ZEPBOUND 2.5 MG/0.5ML ~~LOC~~ SOAJ: 2.5000 mg | SUBCUTANEOUS | 0 refills | Status: DC

## 2023-12-01 NOTE — Progress Notes (Signed)
Madelaine Bhat, CMA,acting as a Neurosurgeon for Arnette Felts, FNP.,have documented all relevant documentation on the behalf of Arnette Felts, FNP,as directed by  Arnette Felts, FNP while in the presence of Arnette Felts, FNP.  Subjective:   Patient ID: Jorge Allen , male    DOB: 04/12/1981 , 43 y.o.   MRN: [id number]  Chief Complaint  Patient presents with   Annual Exam    HPI  Patient presents today for HM, Patient reports compliance with medication. Patient denies any chest pain, SOB, or headaches. Patient would like to get a sleep study and go to the weight loss clinic.     Past Medical History:  Diagnosis Date   Headaches, cluster    Sleep apnea      Family History  Problem Relation Age of Onset   Diabetes Mother    Cancer Mother    Dementia Mother    Hypertension Father    Cancer Father    Glaucoma Father    Other Father        cluster headaches   Dementia Maternal Grandmother    Cancer Maternal Grandfather    Hypertension Paternal Grandmother      Current Outpatient Medications:    amoxicillin-clavulanate (AUGMENTIN) 875-125 MG tablet, Take 1 tablet by mouth every 12 (twelve) hours., Disp: 14 tablet, Rfl: 0   benzonatate (TESSALON PERLES) 100 MG capsule, Take 1 capsule (100 mg total) by mouth 3 (three) times daily as needed., Disp: 30 capsule, Rfl: 0   cetirizine (ZYRTEC ALLERGY) 10 MG tablet, Take 1 tablet (10 mg total) by mouth daily., Disp: 30 tablet, Rfl: 2   hydrOXYzine (ATARAX) 10 MG tablet, Take 1 tablet (10 mg total) by mouth 3 (three) times daily as needed., Disp: 30 tablet, Rfl: 2   meloxicam (MOBIC) 15 MG tablet, Take 1 tablet by mouth once daily, Disp: 30 tablet, Rfl: 0   metFORMIN (GLUCOPHAGE) 500 MG tablet, Take 1 tablet (500 mg total) by mouth daily with breakfast., Disp: 90 tablet, Rfl: 1   omeprazole (PRILOSEC OTC) 20 MG tablet, Take 1 tablet (20 mg total) by mouth daily., Disp: 90 tablet, Rfl: 1   SUMAtriptan (IMITREX) 50 MG tablet, Take 1 tablet (50  mg total) by mouth every 2 (two) hours as needed for migraine. May repeat in 2 h prn, no more than 2 pills/24 h, no more than 3 pills/week., Disp: 10 tablet, Rfl: 3   tirzepatide (ZEPBOUND) 2.5 MG/0.5ML Pen, Inject 2.5 mg into the skin once a week., Disp: 2 mL, Rfl: 0   verapamil (CALAN) 80 MG tablet, Take 1 tablet (80 mg total) by mouth 2 (two) times daily., Disp: 60 tablet, Rfl: 3   Vitamin D, Ergocalciferol, (DRISDOL) 1.25 MG (50000 UNIT) CAPS capsule, Take 1 capsule (50,000 Units total) by mouth every 7 (seven) days., Disp: 5 capsule, Rfl: 1   dicyclomine (BENTYL) 10 MG capsule, Take 1 capsule (10 mg total) by mouth every 8 (eight) hours as needed (abdominal cramping). (Patient not taking: Reported on 12/01/2023), Disp: 12 capsule, Rfl: 0   mometasone (NASONEX) 50 MCG/ACT nasal spray, Place 2 sprays into the nose daily. (Patient not taking: Reported on 12/01/2023), Disp: 1 each, Rfl: 2   No Known Allergies   Men's preventive visit. Patient Health Questionnaire (PHQ-2) is  Flowsheet Row Office Visit from 12/01/2023 in [facility]  PHQ-2 Total Score 0     Patient is on a regular diet.  Exercise - none. He is doing  meal prep.  Marital status: Married. Relevant history for alcohol use is:  Social History   Substance and Sexual Activity  Alcohol Use Yes   Comment: 2-3 days week; 1 or 2 on those days   Relevant history for tobacco use is:  Social History   Tobacco Use  Smoking Status Every Day   Current packs/day: 0.50   Average packs/day: 0.5 packs/day for 48.0 years (24.0 ttl pk-yrs)   Types: Cigarettes  Smokeless Tobacco Never  .   Review of Systems  Constitutional: Negative.   HENT: Negative.    Eyes: Negative.   Respiratory: Negative.    Cardiovascular: Negative.   Gastrointestinal: Negative.   Endocrine: Negative.   Genitourinary: Negative.   Musculoskeletal: Negative.   Allergic/Immunologic: Negative.   Neurological: Negative.    Hematological: Negative.   Psychiatric/Behavioral: Negative.       Today's Vitals   12/01/23 0955  BP: 130/68  Pulse: 89  Temp: 98.6 F (37 C)  TempSrc: Oral  Weight: 291 lb 3.2 oz (132.1 kg)  PainSc: 0-No pain   Body mass index is 48.46 kg/m.  Wt Readings from Last 3 Encounters:  12/01/23 291 lb 3.2 oz (132.1 kg)  10/29/23 296 lb (134.3 kg)  10/15/23 285 lb 0.9 oz (129.3 kg)    Objective:  Physical Exam Vitals reviewed.  Constitutional:      General: He is not in acute distress.    Appearance: Normal appearance. He is obese.  HENT:     Head: Normocephalic and atraumatic.     Right Ear: Tympanic membrane, ear canal and external ear normal. There is no impacted cerumen.     Left Ear: Tympanic membrane, ear canal and external ear normal. There is no impacted cerumen.     Nose: Nose normal.     Mouth/Throat:     Mouth: Mucous membranes are dry.  Cardiovascular:     Rate and Rhythm: Normal rate and regular rhythm.     Pulses: Normal pulses.     Heart sounds: Normal heart sounds. No murmur heard. Pulmonary:     Effort: Pulmonary effort is normal. No respiratory distress.     Breath sounds: Normal breath sounds. No wheezing.  Abdominal:     General: Abdomen is flat. Bowel sounds are normal. There is no distension.     Palpations: Abdomen is soft.  Genitourinary:    Prostate: Normal.     Rectum: Guaiac result negative.  Musculoskeletal:        General: No swelling or tenderness. Normal range of motion.     Cervical back: Normal range of motion and neck supple.  Skin:    General: Skin is warm and dry.     Capillary Refill: Capillary refill takes less than 2 seconds.  Neurological:     General: No focal deficit present.     Mental Status: He is alert and oriented to person, place, and time.     Cranial Nerves: No cranial nerve deficit.     Motor: No weakness.  Psychiatric:        Mood and Affect: Mood normal.        Behavior: Behavior normal.        Thought  Content: Thought content normal.        Judgment: Judgment normal.       Assessment And Plan:    Encounter for annual health examination Assessment & Plan: Behavior modifications discussed and diet history reviewed.   Pt will continue to exercise regularly and  modify diet with low GI, plant based foods and decrease intake of processed foods.  Recommend intake of daily multivitamin, Vitamin D, and calcium.  Recommend for preventive screenings, as well as recommend immunizations that include influenza, TDAP    Encounter for prostate cancer screening -     PSA  Prediabetes Assessment & Plan: HgbA1c is stable, continue focusing on healthy diet.   Orders: -     Hemoglobin A1c -     Lipid panel  Vitamin D deficiency Assessment & Plan: Will check vitamin D level and supplement as needed.    Also encouraged to spend 15 minutes in the sun daily.    Orders: -     VITAMIN D 25 Hydroxy (Vit-D Deficiency, Fractures)  Episodic cluster headache, not intractable Assessment & Plan: This is a new issue, may be related to his untreated sleep apnea. Will refer to Dr. Frances Furbish.   Orders: -     Ambulatory referral to Ophthalmology  OSA on CPAP Assessment & Plan: He is not currently wearing his CPAP, I will refer him back to Dr. Frances Furbish for re-evaluation. We will try to get him approved for Zepbound  Orders: -     CMP14+EGFR -     Ambulatory referral to Sleep Studies  COVID-19 vaccination declined Assessment & Plan: Declines covid 19 vaccine. Discussed risk of covid 70 and if he changes her mind about the vaccine to call the office. Education has been provided regarding the importance of this vaccine but patient still declined. Advised may receive this vaccine at local pharmacy or Health Dept.or vaccine clinic. Aware to provide a copy of the vaccination record if obtained from local pharmacy or Health Dept.  Encouraged to take multivitamin, vitamin d, vitamin c and zinc to increase immune  system. Aware can call office if would like to have vaccine here at office. Verbalized acceptance and understanding.    Morbid obesity with BMI of 45.0-49.9, adult [facility]) Assessment & Plan: he is encouraged to strive for BMI less than 30 to decrease cardiac risk. Goal to perform at least 150 minutes of exercise per week.  Will refer to weight management.  Encouraged to park further when at the store, take stairs instead of elevators and to walk in place during commercials.  Increase water intake to at least one gallon of water daily.   Orders: -     Amb Ref to Medical Weight Management -     Zepbound; Inject 2.5 mg into the skin once a week.  Dispense: 2 mL; Refill: 0  Other long term (current) drug therapy -     CBC with Differential/Platelet    Return for 1 year physical; 2 month weight check. Patient was given opportunity to ask questions. Patient verbalized understanding of the plan and was able to repeat key elements of the plan. All questions were answered to their satisfaction.   Arnette Felts, FNP  I, Arnette Felts, FNP, have reviewed all documentation for this visit. The documentation on 12/01/23 for the exam, diagnosis, procedures, and orders are all accurate and complete.

## 2023-12-01 NOTE — Patient Instructions (Signed)
Health Maintenance  Topic Date Due   COVID-19 Vaccine (1) 12/17/2023*   Pneumococcal Vaccination (1 of 2 - PCV) 11/30/2024*   DTaP/Tdap/Td vaccine (4 - Td or Tdap) 05/23/2031   Hepatitis C Screening  Completed   HIV Screening  Completed   HPV Vaccine  Aged Out   Flu Shot  Discontinued  *Topic was postponed. The date shown is not the original due date.

## 2023-12-02 ENCOUNTER — Encounter (INDEPENDENT_AMBULATORY_CARE_PROVIDER_SITE_OTHER): Payer: 59 | Admitting: Internal Medicine

## 2023-12-02 LAB — CBC WITH DIFFERENTIAL/PLATELET
Basophils Absolute: 0 10*3/uL (ref 0.0–0.2)
Basos: 0 %
EOS (ABSOLUTE): 0.1 10*3/uL (ref 0.0–0.4)
Eos: 1 %
Hematocrit: 41.5 % (ref 37.5–51.0)
Hemoglobin: 13.5 g/dL (ref 13.0–17.7)
Immature Grans (Abs): 0 10*3/uL (ref 0.0–0.1)
Immature Granulocytes: 0 %
Lymphocytes Absolute: 1.7 10*3/uL (ref 0.7–3.1)
Lymphs: 16 %
MCH: 27.6 pg (ref 26.6–33.0)
MCHC: 32.5 g/dL (ref 31.5–35.7)
MCV: 85 fL (ref 79–97)
Monocytes Absolute: 1 10*3/uL — ABNORMAL HIGH (ref 0.1–0.9)
Monocytes: 9 %
Neutrophils Absolute: 7.9 10*3/uL — ABNORMAL HIGH (ref 1.4–7.0)
Neutrophils: 74 %
Platelets: 259 10*3/uL (ref 150–450)
RBC: 4.89 x10E6/uL (ref 4.14–5.80)
RDW: 13.7 % (ref 11.6–15.4)
WBC: 10.6 10*3/uL (ref 3.4–10.8)

## 2023-12-02 LAB — CMP14+EGFR
ALT: 21 [IU]/L (ref 0–44)
AST: 16 [IU]/L (ref 0–40)
Albumin: 3.9 g/dL — ABNORMAL LOW (ref 4.1–5.1)
Alkaline Phosphatase: 95 [IU]/L (ref 44–121)
BUN/Creatinine Ratio: 18 (ref 9–20)
BUN: 12 mg/dL (ref 6–24)
Bilirubin Total: 0.4 mg/dL (ref 0.0–1.2)
CO2: 23 mmol/L (ref 20–29)
Calcium: 9.2 mg/dL (ref 8.7–10.2)
Chloride: 105 mmol/L (ref 96–106)
Creatinine, Ser: 0.67 mg/dL — ABNORMAL LOW (ref 0.76–1.27)
Globulin, Total: 2.5 g/dL (ref 1.5–4.5)
Glucose: 121 mg/dL — ABNORMAL HIGH (ref 70–99)
Potassium: 4.1 mmol/L (ref 3.5–5.2)
Sodium: 140 mmol/L (ref 134–144)
Total Protein: 6.4 g/dL (ref 6.0–8.5)
eGFR: 119 mL/min/{1.73_m2} (ref >59)

## 2023-12-02 LAB — LIPID PANEL
Chol/HDL Ratio: 4 {ratio} (ref 0.0–5.0)
Cholesterol, Total: 123 mg/dL (ref 100–199)
HDL: 31 mg/dL — ABNORMAL LOW (ref >39)
LDL Chol Calc (NIH): 63 mg/dL (ref 0–99)
Triglycerides: 169 mg/dL — ABNORMAL HIGH (ref 0–149)
VLDL Cholesterol Cal: 29 mg/dL (ref 5–40)

## 2023-12-02 LAB — HEMOGLOBIN A1C
Est. average glucose Bld gHb Est-mCnc: 123 mg/dL
Hgb A1c MFr Bld: 5.9 % — ABNORMAL HIGH (ref 4.8–5.6)

## 2023-12-02 LAB — PSA: Prostate Specific Ag, Serum: 0.8 ng/mL (ref 0.0–4.0)

## 2023-12-02 LAB — VITAMIN D 25 HYDROXY (VIT D DEFICIENCY, FRACTURES): Vit D, 25-Hydroxy: 10.8 ng/mL — ABNORMAL LOW (ref 30.0–100.0)

## 2023-12-09 ENCOUNTER — Encounter: Payer: Self-pay | Admitting: Nurse Practitioner

## 2023-12-09 DIAGNOSIS — Z0000 Encounter for general adult medical examination without abnormal findings: Secondary | ICD-10-CM | POA: Insufficient documentation

## 2023-12-09 DIAGNOSIS — Z2821 Immunization not carried out because of patient refusal: Secondary | ICD-10-CM | POA: Insufficient documentation

## 2023-12-09 MED ORDER — VITAMIN D (ERGOCALCIFEROL) 1.25 MG (50000 UNIT) PO CAPS: 50000.0000 [IU] | ORAL_CAPSULE | ORAL | 1 refills | Status: DC

## 2023-12-09 NOTE — Assessment & Plan Note (Signed)
 Behavior modifications discussed and diet history reviewed.   Pt will continue to exercise regularly and modify diet with low GI, plant based foods and decrease intake of processed foods.  Recommend intake of daily multivitamin, Vitamin D, and calcium.  Recommend for preventive screenings, as well as recommend immunizations that include influenza, TDAP

## 2023-12-09 NOTE — Assessment & Plan Note (Addendum)
he is encouraged to strive for BMI less than 30 to decrease cardiac risk. Goal to perform at least 150 minutes of exercise per week.  Will refer to weight management.  Encouraged to park further when at the store, take stairs instead of elevators and to walk in place during commercials.  Increase water intake to at least one gallon of water daily. Once approved for Zepbound he will need to call for nurse visit for teaching. If you have any stomach pain or difficulty swallowing call to office Zepbound may cause nausea allow time for this to improve

## 2023-12-09 NOTE — Assessment & Plan Note (Signed)
This is a new issue, may be related to his untreated sleep apnea. Will refer to Dr. Frances Furbish.

## 2023-12-09 NOTE — Assessment & Plan Note (Signed)

## 2023-12-09 NOTE — Assessment & Plan Note (Signed)
 HgbA1c is stable, continue focusing on healthy diet.

## 2023-12-09 NOTE — Assessment & Plan Note (Signed)
 Will check vitamin D level and supplement as needed.    Also encouraged to spend 15 minutes in the sun daily.

## 2023-12-09 NOTE — Assessment & Plan Note (Signed)
He is not currently wearing his CPAP, I will refer him back to Dr. Frances Furbish for re-evaluation. We will try to get him approved for Zepbound

## 2023-12-17 ENCOUNTER — Ambulatory Visit (INDEPENDENT_AMBULATORY_CARE_PROVIDER_SITE_OTHER): Payer: 59 | Admitting: Family Medicine

## 2023-12-17 ENCOUNTER — Encounter: Payer: Self-pay | Admitting: Family Medicine

## 2023-12-17 ENCOUNTER — Ambulatory Visit: Payer: Self-pay | Admitting: Nurse Practitioner

## 2023-12-17 VITALS — BP 120/76 | HR 95 | Temp 99.0°F | Ht 65.0 in | Wt 277.0 lb

## 2023-12-17 DIAGNOSIS — E66813 Obesity, class 3: Secondary | ICD-10-CM

## 2023-12-17 DIAGNOSIS — Z630 Problems in relationship with spouse or partner: Secondary | ICD-10-CM | POA: Diagnosis not present

## 2023-12-17 DIAGNOSIS — G4733 Obstructive sleep apnea (adult) (pediatric): Secondary | ICD-10-CM | POA: Diagnosis not present

## 2023-12-17 DIAGNOSIS — K921 Melena: Secondary | ICD-10-CM | POA: Diagnosis not present

## 2023-12-17 DIAGNOSIS — Z6841 Body Mass Index (BMI) 40.0 and over, adult: Secondary | ICD-10-CM | POA: Diagnosis not present

## 2023-12-17 DIAGNOSIS — F4325 Adjustment disorder with mixed disturbance of emotions and conduct: Secondary | ICD-10-CM | POA: Diagnosis not present

## 2023-12-17 DIAGNOSIS — K648 Other hemorrhoids: Secondary | ICD-10-CM

## 2023-12-17 DIAGNOSIS — M5440 Lumbago with sciatica, unspecified side: Secondary | ICD-10-CM | POA: Diagnosis not present

## 2023-12-17 MED ORDER — TRIAMCINOLONE ACETONIDE 40 MG/ML IJ SUSP
60.0000 mg | Freq: Once | INTRAMUSCULAR | Status: AC
  Administered 2023-12-17: 60 mg via INTRAMUSCULAR

## 2023-12-17 MED ORDER — HYDROCORTISONE (PERIANAL) 2.5 % EX CREA: 1.0000 | TOPICAL_CREAM | Freq: Two times a day (BID) | CUTANEOUS | 0 refills | Status: DC

## 2023-12-17 MED ORDER — PREDNISONE 5 MG PO TABS: ORAL_TABLET | ORAL | 0 refills | Status: DC

## 2023-12-17 MED ORDER — METHOCARBAMOL 750 MG PO TABS: 750.0000 mg | ORAL_TABLET | Freq: Two times a day (BID) | ORAL | 1 refills | Status: DC | PRN

## 2023-12-17 NOTE — Progress Notes (Signed)
 I,Jameka J Llittleton, CMA,acting as a Neurosurgeon for [employer], NP.,have documented all relevant documentation on the behalf of Ellender Hose, NP,as directed by  Ellender Hose, NP while in the presence of Ellender Hose, NP.  Subjective:  Patient ID: Jorge Allen , male    DOB: 05-26-1981 , 43 y.o.   MRN: [id number]  Chief Complaint  Patient presents with   Blood In Stools    HPI  Patient is a 43 year old male who presents today for blood in his stool. He states that he started seeing  blood in the stool since 2 weeks ago whenever he has a bowel movement. He reports he has also seen a big blood clot.He states that he has hemorrhoids but he does not know if that is what is bleeding right now, he admits that after his bowel movements his anus burns/stings. He denies seeing any blood in his urine. Patient states he has never had colonoscopy done. He wants a referral to Gastroenterology, he states his brother had colon cancer in the past. Patient refused point of care occult blood testing.     Past Medical History:  Diagnosis Date   Headaches, cluster    Sleep apnea      Family History  Problem Relation Age of Onset   Diabetes Mother    Cancer Mother    Dementia Mother    Hypertension Father    Cancer Father    Glaucoma Father    Other Father        cluster headaches   Dementia Maternal Grandmother    Cancer Maternal Grandfather    Hypertension Paternal Grandmother      Current Outpatient Medications:    benzonatate (TESSALON PERLES) 100 MG capsule, Take 1 capsule (100 mg total) by mouth 3 (three) times daily as needed., Disp: 30 capsule, Rfl: 0   cetirizine (ZYRTEC ALLERGY) 10 MG tablet, Take 1 tablet (10 mg total) by mouth daily., Disp: 30 tablet, Rfl: 2   dicyclomine (BENTYL) 10 MG capsule, Take 1 capsule (10 mg total) by mouth every 8 (eight) hours as needed (abdominal cramping)., Disp: 12 capsule, Rfl: 0   hydrocortisone (PROCTOSOL HC) 2.5 % rectal cream, Place 1 Application  rectally 2 (two) times daily., Disp: 30 g, Rfl: 0   meloxicam (MOBIC) 15 MG tablet, Take 1 tablet by mouth once daily, Disp: 30 tablet, Rfl: 0   metFORMIN (GLUCOPHAGE) 500 MG tablet, Take 1 tablet (500 mg total) by mouth daily with breakfast., Disp: 90 tablet, Rfl: 1   methocarbamol (ROBAXIN-750) 750 MG tablet, Take 1 tablet (750 mg total) by mouth 2 (two) times daily as needed for muscle spasms., Disp: 60 tablet, Rfl: 1   mometasone (NASONEX) 50 MCG/ACT nasal spray, Place 2 sprays into the nose daily., Disp: 1 each, Rfl: 2   omeprazole (PRILOSEC OTC) 20 MG tablet, Take 1 tablet (20 mg total) by mouth daily., Disp: 90 tablet, Rfl: 1   predniSONE (DELTASONE) 5 MG tablet, Use as directed, Disp: 21 tablet, Rfl: 0   SUMAtriptan (IMITREX) 50 MG tablet, Take 1 tablet (50 mg total) by mouth every 2 (two) hours as needed for migraine. May repeat in 2 h prn, no more than 2 pills/24 h, no more than 3 pills/week., Disp: 10 tablet, Rfl: 3   verapamil (CALAN) 80 MG tablet, Take 1 tablet (80 mg total) by mouth 2 (two) times daily., Disp: 60 tablet, Rfl: 3   Vitamin D, Ergocalciferol, (DRISDOL) 1.25 MG (50000 UNIT) CAPS capsule, Take  1 capsule (50,000 Units total) by mouth 2 (two) times a week., Disp: 24 capsule, Rfl: 1   hydrOXYzine (ATARAX) 10 MG tablet, Take 1 tablet (10 mg total) by mouth 3 (three) times daily as needed. (Patient not taking: Reported on 12/17/2023), Disp: 30 tablet, Rfl: 2   tirzepatide (ZEPBOUND) 2.5 MG/0.5ML Pen, Inject 2.5 mg into the skin once a week. (Patient not taking: Reported on 12/17/2023), Disp: 2 mL, Rfl: 0   No Known Allergies   Review of Systems  Constitutional: Negative.   Eyes: Negative.   Respiratory: Negative.    Cardiovascular: Negative.   Gastrointestinal:  Positive for blood in stool and diarrhea. Negative for constipation.  Musculoskeletal:  Positive for back pain.  Skin: Negative.   Neurological: Negative.   Psychiatric/Behavioral: Negative.       Today's Vitals    12/17/23 1039  BP: 120/76  Pulse: 95  Temp: 99 F (37.2 C)  TempSrc: Oral  Weight: 277 lb (125.6 kg)  Height: 5\' 5"  (1.651 m)  PainSc: 2   PainLoc: Back   Body mass index is 46.1 kg/m.  Wt Readings from Last 3 Encounters:  12/17/23 277 lb (125.6 kg)  12/01/23 291 lb 3.2 oz (132.1 kg)  10/29/23 296 lb (134.3 kg)    The ASCVD Risk score (Arnett DK, et al., 2019) failed to calculate for the following reasons:   The valid total cholesterol range is 130 to 320 mg/dL  Objective:  Physical Exam Cardiovascular:     Rate and Rhythm: Normal rate and regular rhythm.  Pulmonary:     Effort: Pulmonary effort is normal.     Breath sounds: Normal breath sounds.  Musculoskeletal:        General: Tenderness present.  Neurological:     Mental Status: He is alert and oriented to person, place, and time.  Psychiatric:        Mood and Affect: Mood normal.         Assessment And Plan:  Blood in stool -     Ambulatory referral to Gastroenterology  Other hemorrhoids -     Hydrocortisone (Perianal); Place 1 Application rectally 2 (two) times daily.  Dispense: 30 g; Refill: 0 -     Ambulatory referral to Gastroenterology  Back pain of lumbar region with sciatica -     predniSONE; Use as directed  Dispense: 21 tablet; Refill: 0 -     Methocarbamol; Take 1 tablet (750 mg total) by mouth 2 (two) times daily as needed for muscle spasms.  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 Advised to aim for at least 150 minutes of exercise per week.      Return if symptoms worsen or fail to improve, for keep appt.  Patient was given opportunity to ask questions. Patient verbalized understanding of the plan and was able to repeat key  elements of the plan. All questions were answered to their satisfaction.    I, Ellender Hose, NP, have reviewed all documentation for this visit. The documentation on 12/23/23 for the exam, diagnosis, procedures, and orders are all accurate and complete.   IF YOU HAVE BEEN REFERRED TO A SPECIALIST, IT MAY TAKE 1-2 WEEKS TO SCHEDULE/PROCESS THE REFERRAL. IF YOU HAVE  NOT HEARD FROM US/SPECIALIST IN TWO WEEKS, PLEASE GIVE [place] A CALL AT [phone].

## 2023-12-17 NOTE — Telephone Encounter (Signed)
  Chief Complaint: Blood in stool Symptoms: pain in rectum, clotting Frequency: About 2 weeks Pertinent Negatives: Patient denies fever, abd pain, N/V Disposition: [] ED /[] Urgent Care (no appt availability in office) / [x] Appointment(In office/virtual)/ []  [facility]/ [] Home Care/ [] Refused Recommended Disposition /[] [facility]/ []  Follow-up with PCP Additional Notes: Pt reports he has been experiencing blood in his stool for about 2 weeks. He notes that it is mixed with his stool and he notes occasional small blood clots when wiping. Pt reports pain at rectum, hx of hemorrhoids. Pt denies fever, abd pain, N/V. OV scheduled today AM. This RN educated pt on home care, new-worsening symptoms, when to call back/seek emergent care. Pt verbalized understanding and agrees to plan.    Copied from CRM [id number]. Topic: Clinical - Red Word Triage >> Dec 17, 2023 10:07 AM Phill Myron wrote: Red Word that prompted transfer to Nurse Triage: Blood in stool, weight loss, pain in lower back and butt cheek. Reason for Disposition  MODERATE rectal bleeding (small blood clots, passing blood without stool, or toilet water turns red)  Answer Assessment - Initial Assessment Questions 1. APPEARANCE of BLOOD: "What color is it?" "Is it passed separately, on the surface of the stool, or mixed in with the stool?"      Sometimes it's mixed in with stool, water may be bright red, dark blood on top of stool, sometimes blood clots when wiping. 2. AMOUNT: "How much blood was passed?"      Varies 3. FREQUENCY: "How many times has blood been passed with the stools?"      Every bowel movement 4. ONSET: "When was the blood first seen in the stools?" (Days or weeks)      Approx 2 weeks ago 5. DIARRHEA: "Is there also some diarrhea?" If Yes, ask: "How many diarrhea stools in the past 24 hours?"      Yes, sometimes liquid 6. CONSTIPATION: "Do you have constipation?" If Yes, ask: "How bad is  it?"     None 7. RECURRENT SYMPTOMS: "Have you had blood in your stools before?" If Yes, ask: "When was the last time?" and "What happened that time?"      Hx of hemorrhoid 8. BLOOD THINNERS: "Do you take any blood thinners?" (e.g., Coumadin/warfarin, Pradaxa/dabigatran, aspirin)     None 9. OTHER SYMPTOMS: "Do you have any other symptoms?"  (e.g., abdomen pain, vomiting, dizziness, fever)     None  Protocols used: Rectal Bleeding-A-AH

## 2023-12-23 DIAGNOSIS — K648 Other hemorrhoids: Secondary | ICD-10-CM | POA: Insufficient documentation

## 2023-12-23 DIAGNOSIS — K921 Melena: Secondary | ICD-10-CM | POA: Insufficient documentation

## 2023-12-23 DIAGNOSIS — M5440 Lumbago with sciatica, unspecified side: Secondary | ICD-10-CM | POA: Insufficient documentation

## 2023-12-23 NOTE — Assessment & Plan Note (Signed)
 He is encouraged to strive for BMI less than 30 to decrease cardiac risk. Advised to aim for at least 150 minutes of exercise per week.

## 2023-12-24 DIAGNOSIS — F4325 Adjustment disorder with mixed disturbance of emotions and conduct: Secondary | ICD-10-CM | POA: Diagnosis not present

## 2023-12-24 DIAGNOSIS — Z630 Problems in relationship with spouse or partner: Secondary | ICD-10-CM | POA: Diagnosis not present

## 2023-12-31 ENCOUNTER — Encounter: Payer: Self-pay | Admitting: Neurology

## 2023-12-31 ENCOUNTER — Ambulatory Visit (INDEPENDENT_AMBULATORY_CARE_PROVIDER_SITE_OTHER): Payer: Self-pay | Admitting: Neurology

## 2023-12-31 VITALS — BP 126/76 | HR 88 | Ht 65.0 in | Wt 279.4 lb

## 2023-12-31 DIAGNOSIS — R519 Headache, unspecified: Secondary | ICD-10-CM

## 2023-12-31 DIAGNOSIS — G4733 Obstructive sleep apnea (adult) (pediatric): Secondary | ICD-10-CM

## 2023-12-31 DIAGNOSIS — G4719 Other hypersomnia: Secondary | ICD-10-CM

## 2023-12-31 DIAGNOSIS — Z6841 Body Mass Index (BMI) 40.0 and over, adult: Secondary | ICD-10-CM

## 2023-12-31 DIAGNOSIS — F4325 Adjustment disorder with mixed disturbance of emotions and conduct: Secondary | ICD-10-CM | POA: Diagnosis not present

## 2023-12-31 DIAGNOSIS — Z630 Problems in relationship with spouse or partner: Secondary | ICD-10-CM | POA: Diagnosis not present

## 2023-12-31 MED ORDER — VERAPAMIL HCL 80 MG PO TABS: 80.0000 mg | ORAL_TABLET | Freq: Two times a day (BID) | ORAL | 3 refills | Status: AC

## 2023-12-31 NOTE — Progress Notes (Signed)
 Subjective:    Patient ID: Jorge Allen is a 43 y.o. male.  HPI    Jorge Foley, MD, PhD [name] [address], [address] [address] [place], [place] [address]  Dear [name],  I saw your patient, Jorge Allen, upon your kind request in my sleep clinic today for evaluation of his obstructive sleep apnea.  The patient is accompanied by his mother today.  As you know, Jorge Allen is a 43 year old male with an underlying medical history of recurrent headaches including cluster headaches, allergies, prediabetes, vitamin D deficiency, and morbid obesity with a BMI of over 45, who was previously diagnosed with obstructive sleep apnea and placed on PAP therapy.  He was previously followed by [facility] neurology and I reviewed office visit notes from 02/28/2021 when he saw Jorge Marry, PA and 07/03/2022 when he saw Jorge Hammers, NP.  He reportedly has a history of severe obstructive sleep apnea.  Sleep testing was years ago at [facility].  He believes his sleep study was in 2017.  He does not have a set bedtime, may go to bed somewhere between 9:30 PM and 1 AM.  He received a new CPAP machine about 5 or 6 months ago.  He has a ResMed air sense 11 AutoSet machine with a set pressure of 15 cm without EPR.  He uses a nasal cushion interface.  His DME provider is [facility].  I reviewed his compliance data for the past 30 days.  He is 23 out of 30 days with percent use days greater than 4 hours at 0.70%, indicating adequate compliance, average usage of 6 hours and 38 minutes for days on treatment, residual AHI 1.1/h, leak on the higher side with the 95th percentile at 31.3 L/min. He drinks caffeine in the form of diet Pepsi with alcohol, 1 or 2 servings about 3-4 times a week.  He reports no recurrent morning headaches.  He uses a medium N30 nasal cushion interface from [employer]Med.  He previously tried the nasal pillows.  His Epworth sleepiness score is 16 out of 24, fatigue severity  score is 19 out of 63.  He reports that his headaches have been better.  He continues to take verapamil 80 mg twice daily. He is working on weight loss.  He will reports a recent eye examination, states that his eyesight was 20/30 on the right and 20/20 on the left and he was offered eyeglasses but feels that he is doing well without glasses.  I reviewed your office note from 12/01/2023.  Utilizing Zepbound for weight loss was discussed at the time.   I have followed Jorge Allen in my clinic for episodic cluster headaches.  He was last seen on 10/29/2023 for this.  He has been on verapamil and as needed use of Imitrex.  Previously (copied from previous notes for reference):    10/29/2023: 43 year old male with an underlying medical history of obstructive sleep apnea (on PAP therapy), morbid obesity with a BMI of over 45, vitamin D deficiency, and smoking, who reports recurrence of headaches in the past month.  He has nearly daily headaches.  He has no associated visual disturbance, denies any sudden onset of one-sided weakness or numbness or tingling or droopy face or slurring of speech, headache last for hours, can come twice daily.  He has been utilizing over-the-counter Excedrin in excess, 4 pills at a time, up to twice daily typically.  He does take verapamil but does not think it has helped.  He takes 80 mg in the morning currently.  He has not had an eye examination in years.  He received a new CPAP or AutoPap machine about 6 months ago but has not seen a sleep specialist in years.  He does not know what pressure settings he is on.  He is unsure about his apnea control but reports full compliance with treatment.   He hydrates well with water, estimates that he drinks at least 3 L of water per day.  He drinks alcohol about twice a week, usually 2 drinks of bourbon.  He smokes about a half a pack per day or less.  He limits his caffeine intake.  He used to go to [facility] b[facility] clinic but his insurance  does not cover [facility] system any longer.   I reviewed your office note from 10/01/2023.  He had recently been diagnosed with acute sinusitis and was placed on antibiotic treatment.  He was started on verapamil 40 mg daily for suspected cluster headaches.     I had evaluated him for recurrent headaches in the past, but he was lost to follow-up.   He reports nosebleeds when he blows his nose.  He has not seen ENT for this.   He had a brain MRI with and without contrast on 02/17/2020 and I reviewed the results:   IMPRESSION:  This is a normal MRI of the brain with and without contrast    In addition I personally and independently reviewed images through the PACS system.      02/16/2020: 43 year old right-handed gentleman with an underlying medical history of sleep apnea, and morbid obesity with a BMI of over 45, who presented to the emergency room on 02/13/2020 with a severe headache on the left side, including pain behind the left eye and excess lacrimation.  He was felt to have cluster headaches.  He had a nonfocal neurological exam and no imaging test was done at the time, he was treated symptomatically with Benadryl and Compazine and improved.  He reports that he was given a prescription for Compazine but it did not help and he does not have nausea.  He does not have a prior history of migraines or recurrent headaches.  His father has a history of cluster headaches.  He reports a left-sided headache and it hurts behind the eye and sometimes his nose gets drippy and congested and his left eye tears.  He wakes up in the middle of the night with a headache typically.  He has a history of severe obstructive sleep apnea and had surgery in 2002 after he was diagnosed with sleep apnea and had a tonsillectomy, turbinate reduction and uvulectomy at the time but ended up starting CPAP therapy in 2016.  He had seen his sleep specialist at [facility] for this but has not seen her in years and does  not use his CPAP consistently.  He reports a erratic work schedule and significant work-related stress and does not always sleep with a set schedule and using CPAP makes him sleep deeply that he does not hear the alarm.  Sometimes he does not go to bed until 2 or 3 and his rise time is around 6 or 7.  He owns a Building services engineer.  He is working on weight loss.  He smokes about a pack per day, or a little less.  He tries to hydrate well with water.  He drinks alcohol every day in the form of mixed drinks with bourbon, and a  large yeti cup, 2 or 3/day on average.  He denies any sudden onset of one-sided weakness or numbness or tingling or droopy face or slurring of speech.  He has not seen an eye doctor and does not currently have a primary care physician.  He denies any photophobia or nausea or vomiting.  He currently has a dull achy headache.  Headache started about 10 days ago.   His Past Medical History Is Significant For: Past Medical History:  Diagnosis Date   Headaches, cluster    Sleep apnea     His Past Surgical History Is Significant For: Past Surgical History:  Procedure Laterality Date   FOOT SURGERY     INCISION AND DRAINAGE ABSCESS Right 05/14/2021   Procedure: INCISION AND DRAINAGE ABSCESS;  Surgeon: Riki Altes, MD;  Location: [facility] ORS;  Service: Urology;  Laterality: Right;   TONSILLECTOMY      His Family History Is Significant For: Family History  Problem Relation Age of Onset   Diabetes Mother    Cancer Mother    Dementia Mother    Hypertension Father    Cancer Father    Glaucoma Father    Other Father        cluster headaches   Dementia Maternal Grandmother    Cancer Maternal Grandfather    Hypertension Paternal Grandmother     His Social History Is Significant For: Social History   Socioeconomic History   Marital status: Married    Spouse name: Not on file   Number of children: Not on file   Years of education: Not on file   Highest education level:  Not on file  Occupational History    Comment: catering  Tobacco Use   Smoking status: Every Day    Current packs/day: 0.50    Average packs/day: 0.5 packs/day for 48.0 years (24.0 ttl pk-yrs)    Types: Cigarettes   Smokeless tobacco: Never  Vaping Use   Vaping status: Every Day   Start date: 10/17/2021   Substances: Nicotine   Devices: Vuse  Substance and Sexual Activity   Alcohol use: Yes    Comment: 2-3 days week; 1 or 2 on those days   Drug use: No   Sexual activity: Yes    Birth control/protection: None  Other Topics Concern   Not on file  Social History Narrative   Caffeine: none    Right handed   Social Drivers of Corporate investment banker Strain: Not on file  Food Insecurity: No Food Insecurity (07/03/2022)   Received from [facility]   Hunger Vital Sign    Worried About Running Out of Food in the Last Year: Never true    Ran Out of Food in the Last Year: Never true  Transportation Needs: Not on file  Physical Activity: Not on file  Stress: Not on file  Social Connections: Unknown (03/04/2022)   [facility]   Social Network    Social Network: Not on file    His Allergies Are:  No Known Allergies:   His Current Medications Are:  Outpatient Encounter Medications as of 12/31/2023  Medication Sig   benzonatate (TESSALON PERLES) 100 MG capsule Take 1 capsule (100 mg total) by mouth 3 (three) times daily as needed.   cetirizine (ZYRTEC ALLERGY) 10 MG tablet Take 1 tablet (10 mg total) by mouth daily.   hydrocortisone (PROCTOSOL HC) 2.5 % rectal cream Place 1 Application rectally 2 (two) times daily.  hydrOXYzine (ATARAX) 10 MG tablet Take 1 tablet (10 mg total) by mouth 3 (three) times daily as needed.   meloxicam (MOBIC) 15 MG tablet Take 1 tablet by mouth once daily   metFORMIN (GLUCOPHAGE) 500 MG tablet Take 1 tablet (500 mg total) by mouth daily with breakfast.   methocarbamol (ROBAXIN-750) 750 MG tablet Take  1 tablet (750 mg total) by mouth 2 (two) times daily as needed for muscle spasms.   mometasone (NASONEX) 50 MCG/ACT nasal spray Place 2 sprays into the nose daily.   omeprazole (PRILOSEC OTC) 20 MG tablet Take 1 tablet (20 mg total) by mouth daily.   predniSONE (DELTASONE) 5 MG tablet Use as directed   SUMAtriptan (IMITREX) 50 MG tablet Take 1 tablet (50 mg total) by mouth every 2 (two) hours as needed for migraine. May repeat in 2 h prn, no more than 2 pills/24 h, no more than 3 pills/week.   verapamil (CALAN) 80 MG tablet Take 1 tablet (80 mg total) by mouth 2 (two) times daily.   Vitamin D, Ergocalciferol, (DRISDOL) 1.25 MG (50000 UNIT) CAPS capsule Take 1 capsule (50,000 Units total) by mouth 2 (two) times a week.   dicyclomine (BENTYL) 10 MG capsule Take 1 capsule (10 mg total) by mouth every 8 (eight) hours as needed (abdominal cramping). (Patient not taking: Reported on 12/31/2023)   tirzepatide (ZEPBOUND) 2.5 MG/0.5ML Pen Inject 2.5 mg into the skin once a week. (Patient not taking: Reported on 12/31/2023)   No facility-administered encounter medications on file as of 12/31/2023.  :   Review of Systems:  Out of a complete 14 point review of systems, all are reviewed and negative with the exception of these symptoms as listed below:  Review of Systems  Neurological:        Patient in room #9 with his mother. Patient states he is well and stable, with no new concerns. Patient states his headaches has gotten better. ESS-16, FSS-19    Objective:  Neurological Exam  Physical Exam Physical Examination:   Vitals:   12/31/23 1019  BP: 126/76  Pulse: 88    General Examination: The patient is a very pleasant 43 y.o. male in no acute distress. He appears well-developed and well-nourished and well groomed.   HEENT: Normocephalic, atraumatic, pupils are equal, round and reactive to light, extraocular tracking is good without limitation to gaze excursion or nystagmus noted. Normal smooth  pursuit is noted. Hearing is grossly intact. Face is symmetric with normal facial animation and normal facial sensation. Speech is clear with no dysarthria noted. There is no hypophonia. There is no lip, neck/head, jaw or voice tremor. Neck is supple with full range of passive and active motion. There are no carotid bruits on auscultation. Oropharynx exam reveals: mild mouth dryness, adequate dental hygiene and moderate airway crowding, with small airway entry noted, s/p TE and UPPP. Tongue protrudes centrally in palate elevates symmetrically.  Neck circumference 17-3/8 inches.   Chest: Clear to auscultation without wheezing, rhonchi or crackles noted.   Heart: S1+S2+0, regular and normal without murmurs, rubs or gallops noted.    Abdomen: Soft, non-tender and non-distended.   Extremities: There is no pitting edema in the distal lower extremities bilaterally.     Skin: Warm and dry without trophic changes noted. There are no varicose veins.   Musculoskeletal: exam reveals no obvious joint deformities.    Neurologically:  Mental status: The patient is awake, alert and oriented in all 4 spheres. His immediate and remote memory,  attention, language skills and fund of knowledge are appropriate. There is no evidence of aphasia, agnosia, apraxia or anomia. Speech is clear with normal prosody and enunciation. Thought process is linear. Mood is normal and affect is normal.  Cranial nerves II - XII are as described above under HEENT exam. In addition: shoulder shrug is normal with equal shoulder height noted. Motor exam: Normal bulk, strength and tone is noted. There is no obvious resting or action tremor.  Fine motor skills and coordination: Grossly intact.    Cerebellar testing: No dysmetria or intention tremor on finger to nose testing. Heel to shin is unremarkable bilaterally but slightly difficult due to body habitus. There is no truncal or gait ataxia.  Sensory exam: intact to light touch in the  upper and lower extremities.  Gait, station and balance: He stands easily. No veering to one side is noted. No leaning to one side is noted. Posture is age-appropriate and stance is narrow based. Gait shows normal stride length and normal pace. No problems turning are noted.    Assessment and Plan:    In summary, Jorge Allen is a 43 year old male with an underlying medical history of right ankle injury, obstructive sleep apnea, vitamin D deficiency, smoking, recurrent headaches including cluster headaches, and morbid obesity with a BMI of over 45, who presents for evaluation of his obstructive sleep apnea of several years duration.  Sleep testing was years ago, prior sleep study results are not available for my review today.  He is compliant with his CPAP of 15 cm without EPR, via nasal cushion interface.  He is encouraged to keep a set schedule for sleep and try to get 7 to 8 hours of sleep on any given night and not to skip any nights.  He is advised to continue with his CPAP therapy at the current settings.  He is advised to continue with verapamil 80 mg twice daily.  He is encouraged to continue to work on weight loss, stay well-hydrated and avoid caffeine and alcohol because of his headaches and sleep apnea.  At this juncture, he can keep his appointment with the nurse practitioner coming up in July and if he is stable at the time with regards to his headaches and his sleep apnea, we can see him yearly thereafter.  I answered all his questions today and he was in agreement. Thank you very much for allowing me to participate in the care of this nice patient. If I can be of any further assistance to you please do not hesitate to call me at [phone].  Sincerely,   Jorge Foley, MD, PhD  I spent 60 minutes in total face-to-face time and in reviewing records during pre-charting, more than 50% of which was spent in counseling and coordination of care, reviewing test results, reviewing medications  and treatment regimen and/or in discussing or reviewing the diagnosis of OSA, recurrent headaches, the prognosis and treatment options. Pertinent laboratory and imaging test results that were available during this visit with the patient were reviewed by me and considered in my medical decision making (see chart for details). '

## 2023-12-31 NOTE — Patient Instructions (Addendum)
 Please continue using your CPAP regularly. While your insurance requires that you use CPAP at least 4 hours each night on 70% of the nights, I recommend, that you not skip any nights and use it throughout the night if you can. Getting used to CPAP and staying with the treatment long term does take time and patience and discipline. Untreated obstructive sleep apnea when it is moderate to severe can have an adverse impact on cardiovascular health and raise her risk for heart disease, arrhythmias, hypertension, congestive heart failure, stroke and diabetes. Untreated obstructive sleep apnea causes sleep disruption, nonrestorative sleep, and sleep deprivation. This can have an impact on your day to day functioning and cause daytime sleepiness and impairment of cognitive function, memory loss, mood disturbance, and problems focussing. Using CPAP regularly can improve these symptoms. Continue with verapamil 80 mg twice daily and as needed use of Imitrex. Avoid alcohol and caffeine is much as possible due to your headaches and sleep apnea. Keep a set schedule for your sleep, try to get 7 to 8 hours of sleep on any given night, try not to skip any nights with your CPAP. Follow up as scheduled.

## 2024-01-07 ENCOUNTER — Encounter: Payer: Self-pay | Admitting: Podiatry

## 2024-01-07 ENCOUNTER — Encounter (INDEPENDENT_AMBULATORY_CARE_PROVIDER_SITE_OTHER): Payer: Self-pay | Admitting: Adult Health

## 2024-01-07 ENCOUNTER — Ambulatory Visit (INDEPENDENT_AMBULATORY_CARE_PROVIDER_SITE_OTHER): Payer: Self-pay | Admitting: Podiatry

## 2024-01-07 DIAGNOSIS — F4325 Adjustment disorder with mixed disturbance of emotions and conduct: Secondary | ICD-10-CM | POA: Diagnosis not present

## 2024-01-07 DIAGNOSIS — L989 Disorder of the skin and subcutaneous tissue, unspecified: Secondary | ICD-10-CM

## 2024-01-07 DIAGNOSIS — Z630 Problems in relationship with spouse or partner: Secondary | ICD-10-CM | POA: Diagnosis not present

## 2024-01-07 NOTE — Progress Notes (Signed)
  Subjective:  Patient ID: Jorge Allen, male    DOB: 1981-06-28,  MRN: [id number]  Chief Complaint  Patient presents with   Callouses    RM#13 Bilateral calluses right foot causing pain left fourth toe callus very painful.    Discussed the use of AI scribe software for clinical note transcription with the patient, who gave verbal consent to proceed.  History of Present Illness The patient, with a history of foot surgery due to a plantar wart, presents with painful calluses on both feet, particularly under the toes. The calluses have been present for a while and have been managed with regular pedicures during which he is shaved down. He has also seen Dr. Logan Bores for this issue in the past.  However, about three weeks ago, the patient noticed pus coming out of the calluses, suggesting an infection on the right. The infection seemed to subside, but the pain persisted and has recently worsened. The patient denies any specific injury or incident that could have caused the calluses. He attributes the calluses to being on his feet all the time. The patient also mentions a thick callus and scar tissue from a previous foot surgery, which occasionally causes discomfort. The patient has been using a urea cream inconsistently to manage the calluses.      Objective:    Physical Exam  General: AAO x3, NAD  Dermatological: Calluses noted submetatarsal 3, third interspace bilaterally right side worse than left without any underlying ulceration, drainage or any signs of infection.  Hyperkeratotic lesion also noted along the distal left fourth toe.  There is no areas of fluctuation or crepitation.  No other.  There is no obvious signs of infection noted today.  Hyperkeratotic lesion left fifth metatarsal head again without any underlying ulceration.  There is a scar on the plantar aspect foot from prior surgery which she gets callus overlying this area as well.  Vascular: Dorsalis Pedis artery and Posterior  Tibial artery pedal pulses are 2/4 bilateral with immedate capillary fill time. . There is no pain with calf compression, swelling, warmth, erythema.   Neruologic: Grossly intact via light touch bilateral.   Musculoskeletal: Tenderness on the skin lesions. Gait: Unassisted, Nonantalgic.      No images are attached to the encounter.    Results    Assessment:   1. Benign skin lesion      Plan:  Patient was evaluated and treated and all questions answered.  Assessment and Plan Assessment & Plan Calluses with possible infection Painful calluses with recent pus drainage suggest possible infection. Inconsistent use of urea cream noted. Calluses likely due to pressure and gait, exacerbated by footwear. -Sharply pared all the hyperkeratotic lesions x 4 today without any complications or bleeding.  There is no drainage or any signs of infection noted today. - Re-prescribe urea cream for consistent daily use. - Order compounded cream with urea and salicylic acid for daily application.  This is ordered through [employer].  - Provide pressure-relieving pads. - Recommend Oofos shoes for arch support and cushioning.    No follow-ups on file.   Vivi Barrack DPM

## 2024-01-21 ENCOUNTER — Telehealth: Payer: Self-pay

## 2024-01-21 NOTE — Telephone Encounter (Signed)
 PA FOR ZEPBOUND SENT TO PLAN.

## 2024-02-02 ENCOUNTER — Ambulatory Visit: Payer: 59 | Admitting: Nurse Practitioner

## 2024-02-10 ENCOUNTER — Ambulatory Visit: Admitting: Nurse Practitioner

## 2024-02-10 NOTE — Progress Notes (Deleted)
 [place] Favia, CMA,acting as a Neurosurgeon for Susanna Epley, FNP.,have documented all relevant documentation on the behalf of Susanna Epley, FNP,as directed by  Susanna Epley, FNP while in the presence of Susanna Epley, FNP.  Subjective:  Patient ID: Jorge Allen , male    DOB: 27-Jun-1981 , 43 y.o.   MRN: [id number]  No chief complaint on file.   HPI  Patient presents today for a weight follow up, Patient reports compliance with medication. Patient denies any chest pain, SOB, or headaches. Patient has no concerns today.     Past Medical History:  Diagnosis Date  . Headaches, cluster   . Sleep apnea      Family History  Problem Relation Age of Onset  . Diabetes Mother   . Cancer Mother   . Dementia Mother   . Hypertension Father   . Cancer Father   . Glaucoma Father   . Other Father        cluster headaches  . Dementia Maternal Grandmother   . Cancer Maternal Grandfather   . Hypertension Paternal Grandmother      Current Outpatient Medications:  .  benzonatate  (TESSALON  PERLES) 100 MG capsule, Take 1 capsule (100 mg total) by mouth 3 (three) times daily as needed. (Patient not taking: Reported on 01/07/2024), Disp: 30 capsule, Rfl: 0 .  cetirizine  (ZYRTEC  ALLERGY) 10 MG tablet, Take 1 tablet (10 mg total) by mouth daily., Disp: 30 tablet, Rfl: 2 .  dicyclomine  (BENTYL ) 10 MG capsule, Take 1 capsule (10 mg total) by mouth every 8 (eight) hours as needed (abdominal cramping)., Disp: 12 capsule, Rfl: 0 .  hydrocortisone  (PROCTOSOL HC) 2.5 % rectal cream, Place 1 Application rectally 2 (two) times daily., Disp: 30 g, Rfl: 0 .  hydrOXYzine  (ATARAX ) 10 MG tablet, Take 1 tablet (10 mg total) by mouth 3 (three) times daily as needed., Disp: 30 tablet, Rfl: 2 .  meloxicam  (MOBIC ) 15 MG tablet, Take 1 tablet by mouth once daily, Disp: 30 tablet, Rfl: 0 .  metFORMIN  (GLUCOPHAGE ) 500 MG tablet, Take 1 tablet (500 mg total) by mouth daily with breakfast., Disp: 90 tablet, Rfl: 1 .   methocarbamol  (ROBAXIN -750) 750 MG tablet, Take 1 tablet (750 mg total) by mouth 2 (two) times daily as needed for muscle spasms., Disp: 60 tablet, Rfl: 1 .  mometasone  (NASONEX ) 50 MCG/ACT nasal spray, Place 2 sprays into the nose daily., Disp: 1 each, Rfl: 2 .  omeprazole  (PRILOSEC  OTC) 20 MG tablet, Take 1 tablet (20 mg total) by mouth daily., Disp: 90 tablet, Rfl: 1 .  predniSONE  (DELTASONE ) 5 MG tablet, Use as directed, Disp: 21 tablet, Rfl: 0 .  SUMAtriptan  (IMITREX ) 50 MG tablet, Take 1 tablet (50 mg total) by mouth every 2 (two) hours as needed for migraine. May repeat in 2 h prn, no more than 2 pills/24 h, no more than 3 pills/week., Disp: 10 tablet, Rfl: 3 .  tirzepatide  (ZEPBOUND ) 2.5 MG/0.5ML Pen, Inject 2.5 mg into the skin once a week., Disp: 2 mL, Rfl: 0 .  verapamil  (CALAN ) 80 MG tablet, Take 1 tablet (80 mg total) by mouth 2 (two) times daily., Disp: 60 tablet, Rfl: 3 .  Vitamin D , Ergocalciferol , (DRISDOL ) 1.25 MG (50000 UNIT) CAPS capsule, Take 1 capsule (50,000 Units total) by mouth 2 (two) times a week., Disp: 24 capsule, Rfl: 1   No Known Allergies   Review of Systems   There were no vitals filed for this visit. There is no height or  weight on file to calculate BMI.  Wt Readings from Last 3 Encounters:  12/31/23 279 lb 6.4 oz (126.7 kg)  12/17/23 277 lb (125.6 kg)  12/01/23 291 lb 3.2 oz (132.1 kg)    The ASCVD Risk score (Arnett DK, et al., 2019) failed to calculate for the following reasons:   The valid total cholesterol range is 130 to 320 mg/dL  Objective:  Physical Exam      Assessment And Plan:  There are no diagnoses linked to this encounter.  No follow-ups on file.  Patient was given opportunity to ask questions. Patient verbalized understanding of the plan and was able to repeat key elements of the plan. All questions were answered to their satisfaction.    Inge Mangle, FNP, have reviewed all documentation for this visit. The documentation on  02/10/24 for the exam, diagnosis, procedures, and orders are all accurate and complete.   IF YOU HAVE BEEN REFERRED TO A SPECIALIST, IT MAY TAKE 1-2 WEEKS TO SCHEDULE/PROCESS THE REFERRAL. IF YOU HAVE NOT HEARD FROM US /SPECIALIST IN TWO WEEKS, PLEASE GIVE US  A CALL AT [phone].

## 2024-03-02 ENCOUNTER — Telehealth: Payer: Self-pay

## 2024-03-02 NOTE — Telephone Encounter (Signed)
 Copied from CRM [id number]. Topic: Clinical - Medication Question >> Mar 01, 2024  1:37 PM Marissa P wrote: Reason for CRM: Patient called in saying insurance denied meds because they needed more info they stated that they needed to know more such as bmi, weight etc proving that patient does have sleep apnea. Please follow up with patient as well. They needed all of that included in the pre authorization.  Will look at PA again for patient.

## 2024-04-11 ENCOUNTER — Telehealth: Payer: Self-pay | Admitting: Nurse Practitioner

## 2024-04-11 NOTE — Telephone Encounter (Signed)
 Routing to PCP office for further evaluation & assistance  Copied from CRM [id number]. Topic: Clinical - Prescription Issue >> Apr 11, 2024 12:38 PM Donee H wrote: Reason for CRM:  Patient called wanting an update on medication for Zebound. Patient saying insurance denied meds because they needed more info they stated that they needed to know more such as bmi, weight etc proving that patient does have sleep apnea. Please follow up with patient as well. Patient stating has reached out twice with no follow up on medication as of yet. Call back number [phone]

## 2024-04-11 NOTE — Telephone Encounter (Unsigned)
 Copied from CRM [id number]. Topic: Clinical - Prescription Issue >> Apr 11, 2024 12:38 PM Donee H wrote: Reason for CRM:  Patient called wanting an update on medication for Zebound. Patient saying insurance denied meds because they needed more info they stated that they needed to know more such as bmi, weight etc proving that patient does have sleep apnea. Please follow up with patient as well. Patient stating has reached out twice with no follow up on medication as of yet. Call back number [phone]

## 2024-04-12 ENCOUNTER — Emergency Department: Payer: Self-pay

## 2024-04-12 ENCOUNTER — Other Ambulatory Visit: Payer: Self-pay

## 2024-04-12 DIAGNOSIS — R7303 Prediabetes: Secondary | ICD-10-CM | POA: Insufficient documentation

## 2024-04-12 DIAGNOSIS — F1090 Alcohol use, unspecified, uncomplicated: Secondary | ICD-10-CM | POA: Insufficient documentation

## 2024-04-12 DIAGNOSIS — L03314 Cellulitis of groin: Secondary | ICD-10-CM | POA: Diagnosis not present

## 2024-04-12 DIAGNOSIS — N492 Inflammatory disorders of scrotum: Secondary | ICD-10-CM | POA: Diagnosis not present

## 2024-04-12 DIAGNOSIS — Z6841 Body Mass Index (BMI) 40.0 and over, adult: Secondary | ICD-10-CM | POA: Diagnosis not present

## 2024-04-12 DIAGNOSIS — G4733 Obstructive sleep apnea (adult) (pediatric): Secondary | ICD-10-CM | POA: Insufficient documentation

## 2024-04-12 DIAGNOSIS — Z7901 Long term (current) use of anticoagulants: Secondary | ICD-10-CM | POA: Diagnosis not present

## 2024-04-12 DIAGNOSIS — F17210 Nicotine dependence, cigarettes, uncomplicated: Secondary | ICD-10-CM | POA: Diagnosis not present

## 2024-04-12 DIAGNOSIS — R6510 Systemic inflammatory response syndrome (SIRS) of non-infectious origin without acute organ dysfunction: Secondary | ICD-10-CM | POA: Diagnosis not present

## 2024-04-12 DIAGNOSIS — N50811 Right testicular pain: Secondary | ICD-10-CM | POA: Diagnosis present

## 2024-04-12 LAB — URINALYSIS, ROUTINE W REFLEX MICROSCOPIC
Bacteria, UA: NONE SEEN
Bilirubin Urine: NEGATIVE
Glucose, UA: NEGATIVE mg/dL
Hgb urine dipstick: NEGATIVE
Ketones, ur: NEGATIVE mg/dL
Nitrite: NEGATIVE
Protein, ur: NEGATIVE mg/dL
Specific Gravity, Urine: 1.018 (ref 1.005–1.030)
pH: 5 (ref 5.0–8.0)

## 2024-04-12 NOTE — ED Triage Notes (Signed)
 Pt reports growth on right testicle, pt reports it is hard and has grown to be larger than his testicle. Pt reports he had something similar a few years ago that they surgically removed. Pt reports pain to area and swelling. Pt reports he is also having back pain, hx sciatica

## 2024-04-13 ENCOUNTER — Encounter
Admission: EM | Disposition: A | Discharge status: Home / Self Care | Payer: Self-pay | Source: Home / Self Care | Attending: Emergency Medicine

## 2024-04-13 ENCOUNTER — Observation Stay: Admitting: Anesthesiology

## 2024-04-13 ENCOUNTER — Encounter: Payer: Self-pay | Admitting: Internal Medicine

## 2024-04-13 ENCOUNTER — Other Ambulatory Visit: Payer: Self-pay

## 2024-04-13 ENCOUNTER — Observation Stay
Admission: EM | Admit: 2024-04-13 | Discharge: 2024-04-13 | Disposition: A | Discharge status: Home / Self Care | Payer: Self-pay | Attending: Urology | Admitting: Urology

## 2024-04-13 DIAGNOSIS — G4733 Obstructive sleep apnea (adult) (pediatric): Secondary | ICD-10-CM

## 2024-04-13 DIAGNOSIS — Z6841 Body Mass Index (BMI) 40.0 and over, adult: Secondary | ICD-10-CM

## 2024-04-13 DIAGNOSIS — R7303 Prediabetes: Secondary | ICD-10-CM | POA: Diagnosis present

## 2024-04-13 DIAGNOSIS — N492 Inflammatory disorders of scrotum: Secondary | ICD-10-CM | POA: Diagnosis not present

## 2024-04-13 DIAGNOSIS — R6510 Systemic inflammatory response syndrome (SIRS) of non-infectious origin without acute organ dysfunction: Secondary | ICD-10-CM

## 2024-04-13 HISTORY — PX: INCISION AND DRAINAGE ABSCESS: SHX5864

## 2024-04-13 LAB — BASIC METABOLIC PANEL WITH GFR
Anion gap: 9 (ref 5–15)
BUN: 22 mg/dL — ABNORMAL HIGH (ref 6–20)
CO2: 22 mmol/L (ref 22–32)
Calcium: 8.7 mg/dL — ABNORMAL LOW (ref 8.9–10.3)
Chloride: 103 mmol/L (ref 98–111)
Creatinine, Ser: 0.63 mg/dL (ref 0.61–1.24)
GFR, Estimated: >60 mL/min (ref >60)
Glucose, Bld: 129 mg/dL — ABNORMAL HIGH (ref 70–99)
Potassium: 3.5 mmol/L (ref 3.5–5.1)
Sodium: 134 mmol/L — ABNORMAL LOW (ref 135–145)

## 2024-04-13 LAB — CBC WITH DIFFERENTIAL/PLATELET
Abs Immature Granulocytes: 0.05 10*3/uL (ref 0.00–0.07)
Basophils Absolute: 0 10*3/uL (ref 0.0–0.1)
Basophils Relative: 0 %
Eosinophils Absolute: 0.3 10*3/uL (ref 0.0–0.5)
Eosinophils Relative: 2 %
HCT: 41.6 % (ref 39.0–52.0)
Hemoglobin: 13.7 g/dL (ref 13.0–17.0)
Immature Granulocytes: 0 %
Lymphocytes Relative: 21 %
Lymphs Abs: 3.3 10*3/uL (ref 0.7–4.0)
MCH: 28.1 pg (ref 26.0–34.0)
MCHC: 32.9 g/dL (ref 30.0–36.0)
MCV: 85.4 fL (ref 80.0–100.0)
Monocytes Absolute: 1.1 10*3/uL — ABNORMAL HIGH (ref 0.1–1.0)
Monocytes Relative: 7 %
Neutro Abs: 10.8 10*3/uL — ABNORMAL HIGH (ref 1.7–7.7)
Neutrophils Relative %: 70 %
Platelets: 237 10*3/uL (ref 150–400)
RBC: 4.87 MIL/uL (ref 4.22–5.81)
RDW: 14.6 % (ref 11.5–15.5)
WBC: 15.5 10*3/uL — ABNORMAL HIGH (ref 4.0–10.5)
nRBC: 0 % (ref 0.0–0.2)

## 2024-04-13 LAB — HEMOGLOBIN A1C
Hgb A1c MFr Bld: 5.5 % (ref 4.8–5.6)
Mean Plasma Glucose: 111.15 mg/dL

## 2024-04-13 LAB — PROTIME-INR
INR: 1.1 (ref 0.8–1.2)
Prothrombin Time: 13.9 s (ref 11.4–15.2)

## 2024-04-13 LAB — CHLAMYDIA/NGC RT PCR (ARMC ONLY)
Chlamydia Tr: NOT DETECTED
N gonorrhoeae: NOT DETECTED

## 2024-04-13 LAB — GLUCOSE, CAPILLARY: Glucose-Capillary: 124 mg/dL — ABNORMAL HIGH (ref 70–99)

## 2024-04-13 LAB — LACTIC ACID, PLASMA: Lactic Acid, Venous: 0.6 mmol/L (ref 0.5–1.9)

## 2024-04-13 LAB — CORTISOL-AM, BLOOD: Cortisol - AM: 3.4 ug/dL — ABNORMAL LOW (ref 6.7–22.6)

## 2024-04-13 LAB — HIV ANTIBODY (ROUTINE TESTING W REFLEX): HIV Screen 4th Generation wRfx: NONREACTIVE

## 2024-04-13 SURGERY — INCISION AND DRAINAGE, ABSCESS
Anesthesia: General | Site: Scrotum | Laterality: Right

## 2024-04-13 MED ORDER — HYDROCODONE-ACETAMINOPHEN 5-325 MG PO TABS: 1.0000 | ORAL_TABLET | ORAL | 0 refills | Status: DC | PRN

## 2024-04-13 MED ORDER — DEXAMETHASONE SODIUM PHOSPHATE 10 MG/ML IJ SOLN
INTRAMUSCULAR | Status: AC
Start: 2024-04-13 — End: 2024-04-13
  Filled 2024-04-13: qty 1

## 2024-04-13 MED ORDER — BUPIVACAINE HCL (PF) 0.5 % IJ SOLN
INTRAMUSCULAR | Status: AC
  Filled 2024-04-13: qty 30

## 2024-04-13 MED ORDER — KETOROLAC TROMETHAMINE 30 MG/ML IJ SOLN
INTRAMUSCULAR | Status: AC
  Filled 2024-04-13: qty 1

## 2024-04-13 MED ORDER — ONDANSETRON HCL 4 MG/2ML IJ SOLN
INTRAMUSCULAR | Status: DC | PRN
  Administered 2024-04-13: 4 mg via INTRAVENOUS

## 2024-04-13 MED ORDER — SODIUM CHLORIDE 0.9 % IV SOLN: 2.0000 g | INTRAVENOUS | Status: DC

## 2024-04-13 MED ORDER — SUCCINYLCHOLINE CHLORIDE 200 MG/10ML IV SOSY
PREFILLED_SYRINGE | INTRAVENOUS | Status: AC
Start: 2024-04-13 — End: 2024-04-13
  Filled 2024-04-13: qty 10

## 2024-04-13 MED ORDER — DEXMEDETOMIDINE HCL IN NACL 80 MCG/20ML IV SOLN
INTRAVENOUS | Status: DC | PRN
Start: 2024-04-13 — End: 2024-04-13
  Administered 2024-04-13 (×2): 8 ug via INTRAVENOUS

## 2024-04-13 MED ORDER — OXYCODONE HCL 5 MG/5ML PO SOLN: 5.0000 mg | Freq: Once | ORAL | Status: DC | PRN

## 2024-04-13 MED ORDER — SODIUM CHLORIDE 0.9 % IV SOLN
2.0000 g | Freq: Once | INTRAVENOUS | Status: AC
  Administered 2024-04-13: 2 g via INTRAVENOUS
  Filled 2024-04-13: qty 20

## 2024-04-13 MED ORDER — GLYCOPYRROLATE 0.2 MG/ML IJ SOLN
INTRAMUSCULAR | Status: AC
  Filled 2024-04-13: qty 1

## 2024-04-13 MED ORDER — ACETAMINOPHEN 325 MG PO TABS
650.0000 mg | ORAL_TABLET | Freq: Four times a day (QID) | ORAL | Status: DC | PRN
Start: 2024-04-13 — End: 2024-04-13

## 2024-04-13 MED ORDER — KETOROLAC TROMETHAMINE 30 MG/ML IJ SOLN
INTRAMUSCULAR | Status: DC | PRN
  Administered 2024-04-13: 30 mg via INTRAVENOUS

## 2024-04-13 MED ORDER — LIDOCAINE HCL (PF) 1 % IJ SOLN
INTRAMUSCULAR | Status: AC
  Filled 2024-04-13: qty 30

## 2024-04-13 MED ORDER — ROCURONIUM BROMIDE 10 MG/ML (PF) SYRINGE
PREFILLED_SYRINGE | INTRAVENOUS | Status: AC
  Filled 2024-04-13: qty 10

## 2024-04-13 MED ORDER — DEXMEDETOMIDINE HCL IN NACL 80 MCG/20ML IV SOLN
INTRAVENOUS | Status: AC
  Filled 2024-04-13: qty 20

## 2024-04-13 MED ORDER — 0.9 % SODIUM CHLORIDE (POUR BTL) OPTIME
TOPICAL | Status: DC | PRN
  Administered 2024-04-13: 500 mL

## 2024-04-13 MED ORDER — SUCCINYLCHOLINE CHLORIDE 200 MG/10ML IV SOSY
PREFILLED_SYRINGE | INTRAVENOUS | Status: DC | PRN
  Administered 2024-04-13: 200 mg via INTRAVENOUS

## 2024-04-13 MED ORDER — PHENYLEPHRINE 80 MCG/ML (10ML) SYRINGE FOR IV PUSH (FOR BLOOD PRESSURE SUPPORT)
PREFILLED_SYRINGE | INTRAVENOUS | Status: DC | PRN
  Administered 2024-04-13: 80 ug via INTRAVENOUS

## 2024-04-13 MED ORDER — OXYCODONE HCL 5 MG PO TABS: 5.0000 mg | ORAL_TABLET | Freq: Once | ORAL | Status: DC | PRN

## 2024-04-13 MED ORDER — LACTATED RINGERS IV SOLN: INTRAVENOUS | Status: DC | PRN

## 2024-04-13 MED ORDER — ONDANSETRON HCL 4 MG PO TABS: 4.0000 mg | ORAL_TABLET | Freq: Four times a day (QID) | ORAL | Status: DC | PRN

## 2024-04-13 MED ORDER — FENTANYL CITRATE (PF) 100 MCG/2ML IJ SOLN: 25.0000 ug | INTRAMUSCULAR | Status: DC | PRN

## 2024-04-13 MED ORDER — LIDOCAINE HCL (PF) 2 % IJ SOLN
INTRAMUSCULAR | Status: AC
  Filled 2024-04-13: qty 5

## 2024-04-13 MED ORDER — KETOROLAC TROMETHAMINE 30 MG/ML IJ SOLN: 30.0000 mg | Freq: Four times a day (QID) | INTRAMUSCULAR | Status: DC | PRN

## 2024-04-13 MED ORDER — AMOXICILLIN-POT CLAVULANATE 875-125 MG PO TABS: 1.0000 | ORAL_TABLET | Freq: Two times a day (BID) | ORAL | 0 refills | Status: AC

## 2024-04-13 MED ORDER — ONDANSETRON HCL 4 MG/2ML IJ SOLN: 4.0000 mg | Freq: Four times a day (QID) | INTRAMUSCULAR | Status: DC | PRN

## 2024-04-13 MED ORDER — ONDANSETRON HCL 4 MG/2ML IJ SOLN
INTRAMUSCULAR | Status: AC
  Filled 2024-04-13: qty 2

## 2024-04-13 MED ORDER — MIDAZOLAM HCL 2 MG/2ML IJ SOLN
INTRAMUSCULAR | Status: AC
  Filled 2024-04-13: qty 2

## 2024-04-13 MED ORDER — ACETAMINOPHEN 650 MG RE SUPP: 650.0000 mg | Freq: Four times a day (QID) | RECTAL | Status: DC | PRN

## 2024-04-13 MED ORDER — LIDOCAINE HCL (CARDIAC) PF 100 MG/5ML IV SOSY
PREFILLED_SYRINGE | INTRAVENOUS | Status: DC | PRN
  Administered 2024-04-13: 100 mg via INTRAVENOUS

## 2024-04-13 MED ORDER — PROPOFOL 10 MG/ML IV BOLUS
INTRAVENOUS | Status: DC | PRN
  Administered 2024-04-13: 200 mg via INTRAVENOUS

## 2024-04-13 MED ORDER — ACETAMINOPHEN 10 MG/ML IV SOLN
INTRAVENOUS | Status: AC
  Filled 2024-04-13: qty 100

## 2024-04-13 MED ORDER — LACTATED RINGERS IV SOLN: INTRAVENOUS | Status: DC

## 2024-04-13 MED ORDER — FENTANYL CITRATE (PF) 100 MCG/2ML IJ SOLN
INTRAMUSCULAR | Status: AC
  Filled 2024-04-13: qty 2

## 2024-04-13 MED ORDER — HYDROCODONE-ACETAMINOPHEN 5-325 MG PO TABS: 1.0000 | ORAL_TABLET | ORAL | Status: DC | PRN

## 2024-04-13 MED ORDER — BUPIVACAINE HCL 0.5 % IJ SOLN
INTRAMUSCULAR | Status: DC | PRN
  Administered 2024-04-13: 4 mL

## 2024-04-13 MED ORDER — DEXAMETHASONE SODIUM PHOSPHATE 10 MG/ML IJ SOLN
INTRAMUSCULAR | Status: DC | PRN
  Administered 2024-04-13: 4 mg via INTRAVENOUS

## 2024-04-13 MED ORDER — DOXYCYCLINE HYCLATE 100 MG PO TABS
100.0000 mg | ORAL_TABLET | Freq: Two times a day (BID) | ORAL | 0 refills | Status: AC
Start: 2024-04-13 — End: 2024-04-20

## 2024-04-13 MED ORDER — FENTANYL CITRATE (PF) 100 MCG/2ML IJ SOLN
INTRAMUSCULAR | Status: DC | PRN
  Administered 2024-04-13: 50 ug via INTRAVENOUS

## 2024-04-13 MED ORDER — PROPOFOL 10 MG/ML IV BOLUS
INTRAVENOUS | Status: AC
  Filled 2024-04-13: qty 40

## 2024-04-13 MED ORDER — GLYCOPYRROLATE 0.2 MG/ML IJ SOLN
INTRAMUSCULAR | Status: DC | PRN
  Administered 2024-04-13: .1 mg via INTRAVENOUS

## 2024-04-13 SURGICAL SUPPLY — 29 items
CHLORAPREP W/TINT 26 (MISCELLANEOUS) ×1 IMPLANT
DERMABOND ADVANCED .7 DNX12 (GAUZE/BANDAGES/DRESSINGS) ×1 IMPLANT
DRAIN PENROSE 12X.25 LTX STRL (MISCELLANEOUS) ×1 IMPLANT
DRAPE LAPAROTOMY 77X122 PED (DRAPES) ×1 IMPLANT
DRSG GAUZE FLUFF 36X18 (GAUZE/BANDAGES/DRESSINGS) ×1 IMPLANT
DRSG TELFA 3X8 NADH STRL (GAUZE/BANDAGES/DRESSINGS) IMPLANT
ELECTRODE REM PT RTRN 9FT ADLT (ELECTROSURGICAL) ×1 IMPLANT
GAUZE 4X4 16PLY ~~LOC~~+RFID DBL (SPONGE) ×1 IMPLANT
GAUZE PACKING IODOFORM 1/2INX (GAUZE/BANDAGES/DRESSINGS) IMPLANT
GAUZE SPONGE 4X4 12PLY STRL (GAUZE/BANDAGES/DRESSINGS) ×1 IMPLANT
GAUZE STRETCH 2X75IN STRL (MISCELLANEOUS) ×1 IMPLANT
GLOVE BIOGEL PI IND STRL 7.5 (GLOVE) ×1 IMPLANT
GOWN STRL REUS W/ TWL LRG LVL3 (GOWN DISPOSABLE) ×2 IMPLANT
GOWN STRL REUS W/ TWL XL LVL3 (GOWN DISPOSABLE) ×1 IMPLANT
KIT TURNOVER KIT A (KITS) ×1 IMPLANT
LABEL OR SOLS (LABEL) ×1 IMPLANT
MANIFOLD NEPTUNE II (INSTRUMENTS) ×1 IMPLANT
NDL HYPO 25X1 1.5 SAFETY (NEEDLE) ×1 IMPLANT
NEEDLE HYPO 25X1 1.5 SAFETY (NEEDLE) ×1 IMPLANT
NS IRRIG 500ML POUR BTL (IV SOLUTION) ×1 IMPLANT
PACK BASIN MINOR ARMC (MISCELLANEOUS) ×1 IMPLANT
SOLUTION PREP PVP 2OZ (MISCELLANEOUS) ×1 IMPLANT
SUPPORETR ATHLETIC LG (MISCELLANEOUS) ×1 IMPLANT
SUT VIC AB 3-0 SH 27X BRD (SUTURE) ×2 IMPLANT
SUT VIC AB 4-0 SH 27XANBCTRL (SUTURE) ×1 IMPLANT
SUTURE EHLN 3-0 FS-10 30 BLK (SUTURE) ×1 IMPLANT
SYR 10ML LL (SYRINGE) ×1 IMPLANT
TRAP FLUID SMOKE EVACUATOR (MISCELLANEOUS) ×1 IMPLANT
WATER STERILE IRR 500ML POUR (IV SOLUTION) ×1 IMPLANT

## 2024-04-13 NOTE — Hospital Course (Addendum)
 Taken from H&P.  Jorge Allen is a 43 y.o. male with medical history significant for Prediabetes, morbid obesity, OSA on CPAP, history of hospitalization in 2022 for right scrotal abscess I&D'd in the OR being admitted with a recurrent right scrotal abscess with surrounding cellulitis.   On presentation vitals stable.  Labs with leukocytosis at 15,000. Normal urinalysis and negative chlamydia/GC PCR . Scrotal ultrasound showed right scrotal wall abscess with cellulitis.   Urology was consulted and they were planning to take him to the OR for I&D.  6/25: Vital stable, s/p I&D by urology-pending cultures. Patient did very well after the procedure, no significant pain and he wants to go home.  Case was discussed with urology and they made him an outpatient follow-up appointment for tomorrow for dressing change.  Patient still has packing in place.  Instructions were provided and he was started on Augmentin  and doxycycline  for 1 week.  Urology will follow-up on cultures and make changes to antibiotics as appropriate.  There was some concern of soft sepsis on admission, sepsis ruled out.  Patient was also given some pain medications to use only as needed.  Instructions were provided.  He will continue on current medications and follow-up with his providers closely for further management.

## 2024-04-13 NOTE — Anesthesia Preprocedure Evaluation (Signed)
 Anesthesia Evaluation  Patient identified by MRN, date of birth, ID band Patient awake    Reviewed: Allergy & Precautions, NPO status , Patient's Chart, lab work & pertinent test results  History of Anesthesia Complications Negative for: history of anesthetic complications  Airway Mallampati: III  TM Distance: >3 FB Neck ROM: full    Dental  (+) Chipped   Pulmonary neg shortness of breath, sleep apnea , Current Smoker   Pulmonary exam normal        Cardiovascular Exercise Tolerance: Good (-) angina (-) Past MI negative cardio ROS Normal cardiovascular exam     Neuro/Psych  Headaches  Neuromuscular disease  negative psych ROS   GI/Hepatic Neg liver ROS,GERD  ,,  Endo/Other  negative endocrine ROS    Renal/GU      Musculoskeletal   Abdominal   Peds  Hematology negative hematology ROS (+)   Anesthesia Other Findings Past Medical History: No date: Headaches, cluster No date: Sleep apnea  Past Surgical History: No date: FOOT SURGERY 05/14/2021: INCISION AND DRAINAGE ABSCESS; Right     Comment:  Procedure: INCISION AND DRAINAGE ABSCESS;  Surgeon:               Twylla Glendia BROCKS, MD;  Location: [facility] ORS;  Service:               Urology;  Laterality: Right; No date: TONSILLECTOMY  BMI    Body Mass Index: 47.03 kg/m      Reproductive/Obstetrics negative OB ROS                             Anesthesia Physical Anesthesia Plan  ASA: 3 and emergent  Anesthesia Plan: General ETT   Post-op Pain Management:    Induction: Intravenous  PONV Risk Score and Plan: Ondansetron , Dexamethasone , Midazolam  and Treatment may vary due to age or medical condition  Airway Management Planned: Oral ETT and Video Laryngoscope Planned  Additional Equipment:   Intra-op Plan:   Post-operative Plan: Extubation in OR  Informed Consent: I have reviewed the patients History and Physical, chart, labs and  discussed the procedure including the risks, benefits and alternatives for the proposed anesthesia with the patient or authorized representative who has indicated his/her understanding and acceptance.     Dental Advisory Given  Plan Discussed with: Anesthesiologist, CRNA and Surgeon  Anesthesia Plan Comments: (Patient consented for risks of anesthesia including but not limited to:  - adverse reactions to medications - damage to eyes, teeth, lips or other oral mucosa - nerve damage due to positioning  - sore throat or hoarseness - Damage to heart, brain, nerves, lungs, other parts of body or loss of life  Patient voiced understanding and assent.)       Anesthesia Quick Evaluation

## 2024-04-13 NOTE — Anesthesia Procedure Notes (Signed)
 Procedure Name: Intubation Date/Time: 04/13/2024 8:22 AM  Performed by: Myra Lawless, CRNAPre-anesthesia Checklist: Patient identified, Patient being monitored, Timeout performed, Emergency Drugs available and Suction available Patient Re-evaluated:Patient Re-evaluated prior to induction Oxygen Delivery Method: Circle system utilized Preoxygenation: Pre-oxygenation with 100% oxygen Induction Type: IV induction Ventilation: Mask ventilation without difficulty Laryngoscope Size: McGrath and 4 Grade View: Grade I Tube type: Oral Tube size: 7.0 mm Number of attempts: 1 Airway Equipment and Method: Stylet Placement Confirmation: ETT inserted through vocal cords under direct vision, positive ETCO2 and breath sounds checked- equal and bilateral Secured at: 23 cm Tube secured with: Tape Dental Injury: Teeth and Oropharynx as per pre-operative assessment  Comments: DL x1 with McGrath MAC 4 blade, grade 1 view. Atraumatic intubation. Dentition unchanged from preop baseline.

## 2024-04-13 NOTE — Progress Notes (Signed)
 Patient has a ride coming to pick him up from the hospital. Made patient aware of the driving restriction again per MD.

## 2024-04-13 NOTE — Anesthesia Postprocedure Evaluation (Signed)
 Anesthesia Post Note  Patient: Jorge Allen  Procedure(s) Performed: INCISION AND DRAINAGE, ABSCESS (Right: Scrotum)  Patient location during evaluation: PACU Anesthesia Type: General Level of consciousness: awake and alert Pain management: pain level controlled Vital Signs Assessment: post-procedure vital signs reviewed and stable Respiratory status: spontaneous breathing, nonlabored ventilation and respiratory function stable Cardiovascular status: blood pressure returned to baseline and stable Postop Assessment: no apparent nausea or vomiting Anesthetic complications: no   No notable events documented.   Last Vitals:  Vitals:   04/13/24 0940 04/13/24 0945  BP:  108/66  Pulse: 79 77  Resp: 15 18  Temp:    SpO2: 98% 97%    Last Pain:  Vitals:   04/13/24 0945  TempSrc:   PainSc: 0-No pain                 Fairy POUR Nikkita Adeyemi

## 2024-04-13 NOTE — Discharge Summary (Signed)
 Physician Discharge Summary   Patient: Jorge Allen MRN: [id number] DOB: [birth year]  Admit date:     04/13/2024  Discharge date: 04/13/24  Discharge Physician: Amaryllis Dare   PCP: Georgina Speaks, FNP   Recommendations at discharge:  Please obtain CBC and BMP on follow-up Follow-up with urology tomorrow Follow-up with primary care provider  Discharge Diagnoses: Principal Problem:   Right Scrotal abscess with cellulitis,recurrent Active Problems:   SIRS (systemic inflammatory response syndrome) (HCC)   Prediabetes   Morbid obesity with BMI of 45.0-49.9, adult (HCC)   OSA on CPAP   Hospital Course: Taken from H&P.  Jorge Allen is a 43 y.o. male with medical history significant for Prediabetes, morbid obesity, OSA on CPAP, history of hospitalization in 2022 for right scrotal abscess I&D'd in the OR being admitted with a recurrent right scrotal abscess with surrounding cellulitis.   On presentation vitals stable.  Labs with leukocytosis at 15,000. Normal urinalysis and negative chlamydia/GC PCR . Scrotal ultrasound showed right scrotal wall abscess with cellulitis.   Urology was consulted and they were planning to take him to the OR for I&D.  6/25: Vital stable, s/p I&D by urology-pending cultures. Patient did very well after the procedure, no significant pain and he wants to go home.  Case was discussed with urology and they made him an outpatient follow-up appointment for tomorrow for dressing change.  Patient still has packing in place.  Instructions were provided and he was started on Augmentin  and doxycycline  for 1 week.  Urology will follow-up on cultures and make changes to antibiotics as appropriate.  There was some concern of soft sepsis on admission, sepsis ruled out.  Patient was also given some pain medications to use only as needed.  Instructions were provided.  He will continue on current medications and follow-up with his providers closely for further  management.   Pain control - [place]  Controlled Substance Reporting System database was reviewed. and patient was instructed, not to drive, operate heavy machinery, perform activities at heights, swimming or participation in water activities or provide baby-sitting services while on Pain, Sleep and Anxiety Medications; until their outpatient Physician has advised to do so again. Also recommended to not to take more than prescribed Pain, Sleep and Anxiety Medications.  Consultants: Urology Procedures performed: Incision and drainage of scrotal abscess Disposition: Home Diet recommendation:  Discharge Diet Orders (From admission, onward)     Start     Ordered   04/13/24 0000  Diet - low sodium heart healthy        04/13/24 1228           Carb modified diet DISCHARGE MEDICATION: Allergies as of 04/13/2024   No Known Allergies      Medication List     STOP taking these medications    predniSONE  5 MG tablet Commonly known as: DELTASONE        TAKE these medications    amoxicillin -clavulanate 875-125 MG tablet Commonly known as: AUGMENTIN  Take 1 tablet by mouth 2 (two) times daily for 7 days.   BC HEADACHE PO Take 1 Package by mouth as needed.   benzonatate  100 MG capsule Commonly known as: Tessalon  Perles Take 1 capsule (100 mg total) by mouth 3 (three) times daily as needed.   cetirizine  10 MG tablet Commonly known as: ZyrTEC  Allergy Take 1 tablet (10 mg total) by mouth daily. What changed:  when to take this reasons to take this   cyclobenzaprine  10 MG tablet Commonly known as:  FLEXERIL  Take 10 mg by mouth every 8 (eight) hours as needed.   doxycycline  100 MG tablet Commonly known as: VIBRA -TABS Take 1 tablet (100 mg total) by mouth 2 (two) times daily for 7 days.   HYDROcodone -acetaminophen  5-325 MG tablet Commonly known as: NORCO/VICODIN Take 1-2 tablets by mouth every 4 (four) hours as needed for moderate pain (pain score 4-6).   hydrOXYzine   10 MG tablet Commonly known as: ATARAX  Take 1 tablet (10 mg total) by mouth 3 (three) times daily as needed.   meloxicam  15 MG tablet Commonly known as: MOBIC  Take 1 tablet by mouth once daily   mometasone  50 MCG/ACT nasal spray Commonly known as: Nasonex  Place 2 sprays into the nose daily. What changed:  when to take this reasons to take this   omeprazole  20 MG tablet Commonly known as: PriLOSEC  OTC Take 1 tablet (20 mg total) by mouth daily.   SUMAtriptan  50 MG tablet Commonly known as: Imitrex  Take 1 tablet (50 mg total) by mouth every 2 (two) hours as needed for migraine. May repeat in 2 h prn, no more than 2 pills/24 h, no more than 3 pills/week.   verapamil  80 MG tablet Commonly known as: CALAN  Take 1 tablet (80 mg total) by mouth 2 (two) times daily. What changed:  when to take this reasons to take this   Vitamin D  (Ergocalciferol ) 1.25 MG (50000 UNIT) Caps capsule Commonly known as: DRISDOL  Take 1 capsule (50,000 Units total) by mouth 2 (two) times a week.   Zepbound  2.5 MG/0.5ML Pen Generic drug: tirzepatide  Inject 2.5 mg into the skin once a week.               Discharge Care Instructions  (From admission, onward)           Start     Ordered   04/13/24 0000  Leave dressing on - Keep it clean, dry, and intact until clinic visit        04/13/24 1228            Follow-up Information     Georgina Speaks, FNP. Schedule an appointment as soon as possible for a visit in 1 week(s).   Specialty: General Practice Contact information: [address] [place] [address] [phone]         Twylla Glendia BROCKS, MD. Go on 04/14/2024.   Specialty: Urology Why: At 1:00 PM Contact information: [address] [place] [address] [phone]                Discharge Exam: Fredricka Weights   04/12/24 2117 04/13/24 0332 04/13/24 0751  Weight: 127.9 kg 128.2 kg 128.2 kg   General.  Morbidly obese gentleman,  in no acute distress. Pulmonary.  Lungs clear bilaterally, normal respiratory effort. CV.  Regular rate and rhythm, no JVD, rub or murmur. Abdomen.  Soft, nontender, nondistended, BS positive. CNS.  Alert and oriented .  No focal neurologic deficit. Extremities.  No edema, no cyanosis, pulses intact and symmetrical. Psychiatry.  Judgment and insight appears normal.   Condition at discharge: stable  The results of significant diagnostics from this hospitalization (including imaging, microbiology, ancillary and laboratory) are listed below for reference.   Imaging Studies: US  SCROTUM W/DOPPLER Result Date: 04/12/2024 CLINICAL DATA:  Right scrotal swelling EXAM: SCROTAL ULTRASOUND DOPPLER ULTRASOUND OF THE TESTICLES TECHNIQUE: Complete ultrasound examination of the testicles, epididymis, and other scrotal structures was performed. Color and spectral Doppler ultrasound were also utilized to evaluate  blood flow to the testicles. COMPARISON:  None Available. FINDINGS: Right testicle Measurements: 4.7 x 2.4 x 2.7 cm. No mass or microlithiasis visualized. Left testicle Measurements: 4.1 x 2.1 x 2.7 cm. No mass or microlithiasis visualized. Right epididymis:  Normal in size and appearance. Left epididymis:  Normal in size and appearance. Hydrocele:  Small left hydrocele Varicocele:  Bilateral varicoceles. Pulsed Doppler interrogation of both testes demonstrates normal low resistance arterial and venous waveforms bilaterally. Other: In the area of palpable lump, there is a hypoechoic area measuring 1.9 x 1.9 x 1.2 cm. Surrounding soft tissue thickening with hyperemia. Findings concerning for abscess and surrounding cellulitis. IMPRESSION: No testicular abnormality or evidence of torsion. Hypoechoic complex fluid area in the right scrotal sac lateral to the testicle with surrounding soft tissue thickening and hyperemia. Findings concerning for abscess with surrounding cellulitis. Electronically Signed   By: Franky Crease M.D.   On: 04/12/2024 23:02    Microbiology: Results for orders placed or performed during the hospital encounter of 04/13/24  Chlamydia/NGC rt PCR ([facility] only)     Status: None   Collection Time: 04/12/24  9:10 PM   Specimen: Urine, Clean Catch  Result Value Ref Range Status   Specimen source GC/Chlam ENDOCERVICAL  Final   Chlamydia Tr NOT DETECTED NOT DETECTED Final   N gonorrhoeae NOT DETECTED NOT DETECTED Final    Comment: (NOTE) This CT/NG assay has not been evaluated in patients with a history of  hysterectomy. Performed at [facility], [address] [place], [place] [address]     Labs: CBC: Recent Labs  Lab 04/13/24 0044  WBC 15.5*  NEUTROABS 10.8*  HGB 13.7  HCT 41.6  MCV 85.4  PLT 237   Basic Metabolic Panel: Recent Labs  Lab 04/13/24 0044  NA 134*  K 3.5  CL 103  CO2 22  GLUCOSE 129*  BUN 22*  CREATININE 0.63  CALCIUM 8.7*   Liver Function Tests: No results for input(s): AST, ALT, ALKPHOS, BILITOT, PROT, ALBUMIN in the last 168 hours. CBG: Recent Labs  Lab 04/13/24 1041  GLUCAP 124*    Discharge time spent: greater than 30 minutes.  This record has been created using Conservation officer, historic buildings. Errors have been sought and corrected,but may not always be located. Such creation errors do not reflect on the standard of care.   Signed: Amaryllis Dare, MD [facility] 04/13/2024

## 2024-04-13 NOTE — Consult Note (Signed)
 Urology Consult  Requesting physician: Alm Dixons, MD  Reason for consultation: Scrotal abscess   Assessment/Recommendations:  1.  Right scrotal wall abscess Incision and drainage today The procedure was discussed including potential risks of bleeding.  The need for packing the cavity with dressing changes was discussed. All questions were answered and he desires to proceed  Chief Complaint: Scrotal infection  History of Present Illness: Jorge Allen is a 43 y.o. with a 4-5-day history of right hemiscrotal pain and swelling which has progressively worsened.  Presented to ED last night; scrotal sonogram showed normal-appearing testes and a 2 cm right scrotal wall abscess with changes consistent with surrounding cellulitis Denies fever or chills.  Temp in the ED was 99.  Leukocytosis at 15.5 Similar episode July 2022, underwent I&D  Past Medical History:  Diagnosis Date   Headaches, cluster    Sleep apnea     Past Surgical History:  Procedure Laterality Date   FOOT SURGERY     INCISION AND DRAINAGE ABSCESS Right 05/14/2021   Procedure: INCISION AND DRAINAGE ABSCESS;  Surgeon: Twylla Glendia BROCKS, MD;  Location: [facility] ORS;  Service: Urology;  Laterality: Right;   TONSILLECTOMY      Home Medications:  Current Meds  Medication Sig   Aspirin -Salicylamide-Caffeine (BC HEADACHE PO) Take 1 Package by mouth as needed.   benzonatate  (TESSALON  PERLES) 100 MG capsule Take 1 capsule (100 mg total) by mouth 3 (three) times daily as needed.   cetirizine  (ZYRTEC  ALLERGY) 10 MG tablet Take 1 tablet (10 mg total) by mouth daily. (Patient taking differently: Take 10 mg by mouth daily as needed for allergies.)   cyclobenzaprine  (FLEXERIL ) 10 MG tablet Take 10 mg by mouth every 8 (eight) hours as needed.   hydrOXYzine  (ATARAX ) 10 MG tablet Take 1 tablet (10 mg total) by mouth 3 (three) times daily as needed.   meloxicam  (MOBIC ) 15 MG tablet Take 1 tablet by mouth once daily   mometasone   (NASONEX ) 50 MCG/ACT nasal spray Place 2 sprays into the nose daily. (Patient taking differently: Place 2 sprays into the nose daily as needed.)   SUMAtriptan  (IMITREX ) 50 MG tablet Take 1 tablet (50 mg total) by mouth every 2 (two) hours as needed for migraine. May repeat in 2 h prn, no more than 2 pills/24 h, no more than 3 pills/week.   verapamil  (CALAN ) 80 MG tablet Take 1 tablet (80 mg total) by mouth 2 (two) times daily. (Patient taking differently: Take 80 mg by mouth 2 (two) times daily as needed.)   Vitamin D , Ergocalciferol , (DRISDOL ) 1.25 MG (50000 UNIT) CAPS capsule Take 1 capsule (50,000 Units total) by mouth 2 (two) times a week.    Allergies: No Known Allergies  Family History  Problem Relation Age of Onset   Diabetes Mother    Cancer Mother    Dementia Mother    Hypertension Father    Cancer Father    Glaucoma Father    Other Father        cluster headaches   Dementia Maternal Grandmother    Cancer Maternal Grandfather    Hypertension Paternal Grandmother     Social History:  reports that he has been smoking cigarettes. He has a 24 pack-year smoking history. He has never used smokeless tobacco. He reports current alcohol use. He reports that he does not use drugs.  ROS: A complete review of systems was performed.  All systems are negative except for pertinent findings as noted.  Physical Exam:  Vital signs in last 24 hours: Temp:  [98.4 F (36.9 C)-99 F (37.2 C)] 98.8 F (37.1 C) (06/25 0324) Pulse Rate:  [83-93] 83 (06/25 0324) Resp:  [16-20] 16 (06/25 0324) BP: (106-126)/(60-77) 112/64 (06/25 0324) SpO2:  [93 %-97 %] 93 % (06/25 0324) Weight:  [127.9 kg-128.2 kg] 128.2 kg (06/25 0332) Constitutional:  Alert and oriented, No acute distress HEENT: [place] AT, moist mucus membranes.  Trachea midline, no masses Cardiovascular: Regular rate and rhythm Respiratory: Normal respiratory effort, lungs clear bilaterally GI: Abdomen is soft, nontender, nondistended, no  abdominal masses GU: Phallus without lesions.  Testes descended bilateral without masses or tenderness.  ~3 cm fluctuant area lateral aspect right hemiscrotum with slight drainage.  Surrounding induration Psychiatric: Normal mood and affect   Laboratory Data:  Recent Labs    04/13/24 0044  WBC 15.5*  HGB 13.7  HCT 41.6   Recent Labs    04/13/24 0044  NA 134*  K 3.5  CL 103  CO2 22  GLUCOSE 129*  BUN 22*  CREATININE 0.63  CALCIUM 8.7*   Recent Labs    04/13/24 0421  INR 1.1    Radiologic Imaging: Ultrasound images were personally reviewed and interpreted  US  SCROTUM W/DOPPLER Result Date: 04/12/2024 CLINICAL DATA:  Right scrotal swelling EXAM: SCROTAL ULTRASOUND DOPPLER ULTRASOUND OF THE TESTICLES TECHNIQUE: Complete ultrasound examination of the testicles, epididymis, and other scrotal structures was performed. Color and spectral Doppler ultrasound were also utilized to evaluate blood flow to the testicles. COMPARISON:  None Available. FINDINGS: Right testicle Measurements: 4.7 x 2.4 x 2.7 cm. No mass or microlithiasis visualized. Left testicle Measurements: 4.1 x 2.1 x 2.7 cm. No mass or microlithiasis visualized. Right epididymis:  Normal in size and appearance. Left epididymis:  Normal in size and appearance. Hydrocele:  Small left hydrocele Varicocele:  Bilateral varicoceles. Pulsed Doppler interrogation of both testes demonstrates normal low resistance arterial and venous waveforms bilaterally. Other: In the area of palpable lump, there is a hypoechoic area measuring 1.9 x 1.9 x 1.2 cm. Surrounding soft tissue thickening with hyperemia. Findings concerning for abscess and surrounding cellulitis. IMPRESSION: No testicular abnormality or evidence of torsion. Hypoechoic complex fluid area in the right scrotal sac lateral to the testicle with surrounding soft tissue thickening and hyperemia. Findings concerning for abscess with surrounding cellulitis. Electronically Signed   By:  Franky Crease M.D.   On: 04/12/2024 23:02     04/13/2024, 7:29 AM  Glendia Barba,  MD

## 2024-04-13 NOTE — Assessment & Plan Note (Signed)
 Complicating factor to overall prognosis Patient currently on Zepbound 

## 2024-04-13 NOTE — TOC CM/SW Note (Signed)
 Transition of Care [facility]) - Inpatient Brief Assessment   Patient Details  Name: Jorge Allen MRN: [id number] Date of Birth: 25-Jul-1981  [facility] CM/SW Contact:    Lauraine JAYSON Carpen, LCSW Phone Number: 04/13/2024, 1:07 PM   Clinical Narrative: Patient has orders to discharge home today. Chart reviewed. No TOC needs identified. CSW signing off.  Transition of Care Asessment: Insurance and Status: Insurance coverage has been reviewed Patient has primary care physician: Yes Home environment has been reviewed: Single family home Prior level of function:: Not documented Prior/Current Home Services: No current home services Social Drivers of Health Review: SDOH reviewed no interventions necessary Readmission risk has been reviewed: Yes Transition of care needs: no transition of care needs at this time

## 2024-04-13 NOTE — Op Note (Signed)
    Preoperative diagnosis:  Right scrotal wall abscess  Postoperative diagnosis:  Same  Procedure: Incision and drainage right scrotal wall abscess  Surgeon: Glendia JAYSON Barba, MD  Anesthesia: General  Complications: None  Intraoperative findings:  ~5 cc purulent fluid obtained Abscess cavity ~1 x 2 cm  EBL: Minimal  Specimens: Culture swabs obtained  Indication: Jorge Allen is a 43 y.o. male with a right scrotal wall abscess.  Refer to consult note 04/13/2024 for details.  After reviewing the management options for treatment, he elected to proceed with the above surgical procedure(s). We have discussed the potential benefits and risks of the procedure, side effects of the proposed treatment, the likelihood of the patient achieving the goals of the procedure, and any potential problems that might occur during the procedure or recuperation. Informed consent has been obtained.  Description of procedure:  The patient was taken to the operating room and general anesthesia was induced.  The patient was placed in the supine position, prepped and draped in the usual sterile fashion. A preoperative time-out was performed.   The fluctuant area in the right hemiscrotum was grasped and elevated.  A 2 cm incision was made transversely with findings described above.  Culture swabs were obtained as above.  The abscess cavity was explored with a hemostat and no loculations were appreciated.  The cavity was copiously irrigated with saline.  Hemostasis was noted to be adequate.  Skin edges were anesthetized with 0.5% plain bupivacaine.  The cavity was packed with 1/2 iodoform gauze  A dressing of Telfa, fluffs and scrotal support was applied.  After anesthetic reversal he was transported to the PACU in stable condition.  Plan: Follow-up 04/14/2024 for dressing change   Glendia JAYSON Barba, M.D.

## 2024-04-13 NOTE — Hospital Course (Signed)
 Jorge Allen

## 2024-04-13 NOTE — Assessment & Plan Note (Addendum)
 A1c of 5.9 in February 2025, down from 6.2 in 2022 We will get repeat A1c Blood sugar currently normal-will get CBG twice daily

## 2024-04-13 NOTE — Assessment & Plan Note (Signed)
 SIRS, possible sepsis Soft sepsis criteria to include low-grade temp mild tachycardia mild hypotension, leukocytosis Will get lactic acid IV fluid bolus Continue Rocephin N.p.o. for possible I&D Formal urology consult placed

## 2024-04-13 NOTE — Assessment & Plan Note (Signed)
 CPAP nightly

## 2024-04-13 NOTE — H&P (Addendum)
 History and Physical    Patient: Jorge Allen FMW:[id number] DOB: 1981-03-11 DOA: 04/13/2024 DOS: the patient was seen and examined on 04/13/2024 PCP: Georgina Speaks, FNP  Patient coming from: Home  Chief Complaint:  Chief Complaint  Patient presents with   Testicle Pain    HPI: Jorge Allen is a 43 y.o. male with medical history significant for Prediabetes, morbid obesity, OSA on CPAP, history of hospitalization in 2022 for right scrotal abscess I&D'd in the OR being admitted with a recurrent right scrotal abscess with surrounding cellulitis.  Patient states that he has ongoing swelling of the right scrotum but over the past 4 days it has become increasingly tender  He denies fever or chills.  In the ED Tmax of 99 with initial heart rate 93 and soft BP of 106/60.  Labs notable for WBC 15,000 but otherwise unremarkable.  Normal urinalysis and negative chlamydia/GC PCR .  Scrotal ultrasound showed right scrotal wall abscess with cellulitis.  The ED provider spoke with urologist, Dr. Twylla who will see patient and might take to the OR for I&D versus bedside.  Patient treated with Rocephin and placed NPO.  Admission requested.     Review of Systems: As mentioned in the history of present illness. All other systems reviewed and are negative.  Past Medical History:  Diagnosis Date   Headaches, cluster    Sleep apnea    Past Surgical History:  Procedure Laterality Date   FOOT SURGERY     INCISION AND DRAINAGE ABSCESS Right 05/14/2021   Procedure: INCISION AND DRAINAGE ABSCESS;  Surgeon: Twylla Glendia BROCKS, MD;  Location: [facility] ORS;  Service: Urology;  Laterality: Right;   TONSILLECTOMY     Social History:  reports that he has been smoking cigarettes. He has a 24 pack-year smoking history. He has never used smokeless tobacco. He reports current alcohol use. He reports that he does not use drugs.  No Known Allergies  Family History  Problem Relation Age of Onset   Diabetes Mother     Cancer Mother    Dementia Mother    Hypertension Father    Cancer Father    Glaucoma Father    Other Father        cluster headaches   Dementia Maternal Grandmother    Cancer Maternal Grandfather    Hypertension Paternal Grandmother     Prior to Admission medications   Medication Sig Start Date End Date Taking? Authorizing Provider  Aspirin -Salicylamide-Caffeine (BC HEADACHE PO) Take 1 Package by mouth as needed.   Yes [provider]  benzonatate  (TESSALON  PERLES) 100 MG capsule Take 1 capsule (100 mg total) by mouth 3 (three) times daily as needed. 10/01/23 09/30/24 Yes Petrina Pries, NP  cetirizine  (ZYRTEC  ALLERGY) 10 MG tablet Take 1 tablet (10 mg total) by mouth daily. Patient taking differently: Take 10 mg by mouth daily as needed for allergies. 03/24/23 04/13/24 Yes Georgina Speaks, FNP  cyclobenzaprine  (FLEXERIL ) 10 MG tablet Take 10 mg by mouth every 8 (eight) hours as needed. 03/03/24  Yes [provider]  hydrOXYzine  (ATARAX ) 10 MG tablet Take 1 tablet (10 mg total) by mouth 3 (three) times daily as needed. 08/14/22  Yes Georgina Speaks, FNP  meloxicam  (MOBIC ) 15 MG tablet Take 1 tablet by mouth once daily 11/27/23  Yes Petrina Pries, NP  mometasone  (NASONEX ) 50 MCG/ACT nasal spray Place 2 sprays into the nose daily. Patient taking differently: Place 2 sprays into the nose daily as needed. 03/24/23 04/13/24 Yes Moore,  Gaines, FNP  SUMAtriptan  (IMITREX ) 50 MG tablet Take 1 tablet (50 mg total) by mouth every 2 (two) hours as needed for migraine. May repeat in 2 h prn, no more than 2 pills/24 h, no more than 3 pills/week. 10/29/23  Yes Buck Saucer, MD  verapamil  (CALAN ) 80 MG tablet Take 1 tablet (80 mg total) by mouth 2 (two) times daily. Patient taking differently: Take 80 mg by mouth 2 (two) times daily as needed. 12/31/23  Yes Buck Saucer, MD  Vitamin D , Ergocalciferol , (DRISDOL ) 1.25 MG (50000 UNIT) CAPS capsule Take 1 capsule (50,000 Units total) by mouth 2 (two) times a  week. 12/10/23  Yes Georgina Gaines, FNP  omeprazole  (PRILOSEC  OTC) 20 MG tablet Take 1 tablet (20 mg total) by mouth daily. Patient not taking: Reported on 04/13/2024 11/25/22   Georgina Gaines, FNP  predniSONE  (DELTASONE ) 5 MG tablet Use as directed Patient not taking: Reported on 04/13/2024 12/17/23   Petrina Pries, NP  tirzepatide  (ZEPBOUND ) 2.5 MG/0.5ML Pen Inject 2.5 mg into the skin once a week. Patient not taking: Reported on 04/13/2024 12/01/23   Georgina Gaines, FNP    Physical Exam: Vitals:   04/12/24 2117 04/12/24 2118 04/13/24 0204  BP:  126/77 106/60  Pulse:  93 87  Resp:  18 20  Temp:  98.4 F (36.9 C) 99 F (37.2 C)  TempSrc:   Oral  SpO2:  97% 96%  Weight: 127.9 kg    Height: 5' 5 (1.651 m)     Physical Exam Vitals and nursing note reviewed.  Constitutional:      General: He is not in acute distress. HENT:     Head: Normocephalic and atraumatic.   Cardiovascular:     Rate and Rhythm: Normal rate and regular rhythm.     Heart sounds: Normal heart sounds.  Pulmonary:     Effort: Pulmonary effort is normal.     Breath sounds: Normal breath sounds.  Abdominal:     Palpations: Abdomen is soft.     Tenderness: There is no abdominal tenderness.   Neurological:     Mental Status: Mental status is at baseline.     Labs on Admission: I have personally reviewed following labs and imaging studies  CBC: Recent Labs  Lab 04/13/24 0044  WBC 15.5*  NEUTROABS 10.8*  HGB 13.7  HCT 41.6  MCV 85.4  PLT 237   Basic Metabolic Panel: Recent Labs  Lab 04/13/24 0044  NA 134*  K 3.5  CL 103  CO2 22  GLUCOSE 129*  BUN 22*  CREATININE 0.63  CALCIUM 8.7*   GFR: Estimated Creatinine Clearance: 148.4 mL/min (by C-G formula based on SCr of 0.63 mg/dL). Liver Function Tests: No results for input(s): AST, ALT, ALKPHOS, BILITOT, PROT, ALBUMIN in the last 168 hours. No results for input(s): LIPASE, AMYLASE in the last 168 hours. No results for input(s):  AMMONIA in the last 168 hours. Coagulation Profile: No results for input(s): INR, PROTIME in the last 168 hours. Cardiac Enzymes: No results for input(s): CKTOTAL, CKMB, CKMBINDEX, TROPONINI in the last 168 hours. BNP (last 3 results) No results for input(s): PROBNP in the last 8760 hours. HbA1C: No results for input(s): HGBA1C in the last 72 hours. CBG: No results for input(s): GLUCAP in the last 168 hours. Lipid Profile: No results for input(s): CHOL, HDL, LDLCALC, TRIG, CHOLHDL, LDLDIRECT in the last 72 hours. Thyroid Function Tests: No results for input(s): TSH, T4TOTAL, FREET4, T3FREE, THYROIDAB in the last 72 hours. Anemia Panel:  No results for input(s): VITAMINB12, FOLATE, FERRITIN, TIBC, IRON, RETICCTPCT in the last 72 hours. Urine analysis:    Component Value Date/Time   COLORURINE STRAW (A) 04/12/2024 2110   APPEARANCEUR CLEAR (A) 04/12/2024 2110   LABSPEC 1.018 04/12/2024 2110   PHURINE 5.0 04/12/2024 2110   GLUCOSEU NEGATIVE 04/12/2024 2110   HGBUR NEGATIVE 04/12/2024 2110   HGBUR negative 12/17/2007 1359   BILIRUBINUR NEGATIVE 04/12/2024 2110   KETONESUR NEGATIVE 04/12/2024 2110   PROTEINUR NEGATIVE 04/12/2024 2110   UROBILINOGEN 0.2 12/17/2007 1359   NITRITE NEGATIVE 04/12/2024 2110   LEUKOCYTESUR TRACE (A) 04/12/2024 2110    Radiological Exams on Admission: US  SCROTUM W/DOPPLER Result Date: 04/12/2024 CLINICAL DATA:  Right scrotal swelling EXAM: SCROTAL ULTRASOUND DOPPLER ULTRASOUND OF THE TESTICLES TECHNIQUE: Complete ultrasound examination of the testicles, epididymis, and other scrotal structures was performed. Color and spectral Doppler ultrasound were also utilized to evaluate blood flow to the testicles. COMPARISON:  None Available. FINDINGS: Right testicle Measurements: 4.7 x 2.4 x 2.7 cm. No mass or microlithiasis visualized. Left testicle Measurements: 4.1 x 2.1 x 2.7 cm. No mass or microlithiasis  visualized. Right epididymis:  Normal in size and appearance. Left epididymis:  Normal in size and appearance. Hydrocele:  Small left hydrocele Varicocele:  Bilateral varicoceles. Pulsed Doppler interrogation of both testes demonstrates normal low resistance arterial and venous waveforms bilaterally. Other: In the area of palpable lump, there is a hypoechoic area measuring 1.9 x 1.9 x 1.2 cm. Surrounding soft tissue thickening with hyperemia. Findings concerning for abscess and surrounding cellulitis. IMPRESSION: No testicular abnormality or evidence of torsion. Hypoechoic complex fluid area in the right scrotal sac lateral to the testicle with surrounding soft tissue thickening and hyperemia. Findings concerning for abscess with surrounding cellulitis. Electronically Signed   By: Franky Crease M.D.   On: 04/12/2024 23:02   Data Reviewed for HPI: Relevant notes from primary care and specialist visits, past discharge summaries as available in EHR, including Care Everywhere. Prior diagnostic testing as pertinent to current admission diagnoses Updated medications and problem lists for reconciliation ED course, including vitals, labs, imaging, treatment and response to treatment Triage notes, nursing and pharmacy notes and ED provider's notes Notable results as noted above in HPI      Assessment and Plan: * Right Scrotal abscess with cellulitis,recurrent SIRS, possible sepsis Soft sepsis criteria to include low-grade temp mild tachycardia mild hypotension, leukocytosis Will get lactic acid IV fluid bolus Continue Rocephin N.p.o. for possible I&D Formal urology consult placed  Prediabetes  A1c of 5.9 in February 2025, down from 6.2 in 2022 We will get repeat A1c Blood sugar currently normal-will get CBG twice daily  OSA on CPAP CPAP nightly  Morbid obesity with BMI of 45.0-49.9, adult (HCC) Complicating factor to overall prognosis Patient currently on Zepbound     DVT prophylaxis: SCD  for OR procedure  Consults: Urology, Dr. Twylla  Advance Care Planning:   Code Status: Prior   Family Communication: none  Disposition Plan: Back to previous home environment  Severity of Illness: The appropriate patient status for this patient is OBSERVATION. Observation status is judged to be reasonable and necessary in order to provide the required intensity of service to ensure the patient's safety. The patient's presenting symptoms, physical exam findings, and initial radiographic and laboratory data in the context of their medical condition is felt to place them at decreased risk for further clinical deterioration. Furthermore, it is anticipated that the patient will be medically stable for discharge from the  hospital within 2 midnights of admission.   Author: Delayne LULLA Solian, MD 04/13/2024 2:19 AM  For on call review [email or web address].

## 2024-04-13 NOTE — Progress Notes (Deleted)
 04/14/2024 9:45 PM   Jorge Allen 16-Aug-1981 [id number]  Referring provider: Georgina Speaks, FNP [address] [place],  [place] [address]  Urological history: 1. Scrotal abscess - I & D of right scrotal abscess (2022)   No chief complaint on file.  HPI: Jorge Allen is a 43 y.o. man who presents today for ongoing wound care following incision and drainage of right scrotal abscess.  Previous records reviewed.   He reports mild discomfort, but denies fever, chills, or purulent drainage.  No urinary symptoms or new skin changes noted.    PMH: Past Medical History:  Diagnosis Date   Headaches, cluster    Sleep apnea     Surgical History: Past Surgical History:  Procedure Laterality Date   FOOT SURGERY     INCISION AND DRAINAGE ABSCESS Right 05/14/2021   Procedure: INCISION AND DRAINAGE ABSCESS;  Surgeon: Twylla Glendia BROCKS, MD;  Location: [facility] ORS;  Service: Urology;  Laterality: Right;   TONSILLECTOMY      Home Medications:  Allergies as of 04/14/2024   No Known Allergies      Medication List        Accurate as of April 13, 2024  9:45 PM. If you have any questions, ask your nurse or doctor.          amoxicillin -clavulanate 875-125 MG tablet Commonly known as: AUGMENTIN  Take 1 tablet by mouth 2 (two) times daily for 7 days.   BC HEADACHE PO Take 1 Package by mouth as needed.   benzonatate  100 MG capsule Commonly known as: Tessalon  Perles Take 1 capsule (100 mg total) by mouth 3 (three) times daily as needed.   cetirizine  10 MG tablet Commonly known as: ZyrTEC  Allergy Take 1 tablet (10 mg total) by mouth daily. What changed:  when to take this reasons to take this   cyclobenzaprine  10 MG tablet Commonly known as: FLEXERIL  Take 10 mg by mouth every 8 (eight) hours as needed.   doxycycline  100 MG tablet Commonly known as: VIBRA -TABS Take 1 tablet (100 mg total) by mouth 2 (two) times daily for 7 days.    HYDROcodone -acetaminophen  5-325 MG tablet Commonly known as: NORCO/VICODIN Take 1-2 tablets by mouth every 4 (four) hours as needed for moderate pain (pain score 4-6).   hydrOXYzine  10 MG tablet Commonly known as: ATARAX  Take 1 tablet (10 mg total) by mouth 3 (three) times daily as needed.   meloxicam  15 MG tablet Commonly known as: MOBIC  Take 1 tablet by mouth once daily   mometasone  50 MCG/ACT nasal spray Commonly known as: Nasonex  Place 2 sprays into the nose daily. What changed:  when to take this reasons to take this   omeprazole  20 MG tablet Commonly known as: PriLOSEC  OTC Take 1 tablet (20 mg total) by mouth daily.   SUMAtriptan  50 MG tablet Commonly known as: Imitrex  Take 1 tablet (50 mg total) by mouth every 2 (two) hours as needed for migraine. May repeat in 2 h prn, no more than 2 pills/24 h, no more than 3 pills/week.   verapamil  80 MG tablet Commonly known as: CALAN  Take 1 tablet (80 mg total) by mouth 2 (two) times daily. What changed:  when to take this reasons to take this   Vitamin D  (Ergocalciferol ) 1.25 MG (50000 UNIT) Caps capsule Commonly known as: DRISDOL  Take 1 capsule (50,000 Units total) by mouth 2 (two) times a week.   Zepbound  2.5 MG/0.5ML Pen Generic drug: tirzepatide  Inject 2.5 mg into  the skin once a week.        Allergies: No Known Allergies  Family History: Family History  Problem Relation Age of Onset   Diabetes Mother    Cancer Mother    Dementia Mother    Hypertension Father    Cancer Father    Glaucoma Father    Other Father        cluster headaches   Dementia Maternal Grandmother    Cancer Maternal Grandfather    Hypertension Paternal Grandmother     Social History:  reports that he has been smoking cigarettes. He has a 24 pack-year smoking history. He has never used smokeless tobacco. He reports current alcohol use. He reports that he does not use drugs.  ROS: Pertinent ROS in HPI  Physical Exam: There were  no vitals taken for this visit.  Constitutional:  Well nourished. Alert and oriented, No acute distress. HEENT: [place] AT, moist mucus membranes.  Trachea midline, no masses. Cardiovascular: No clubbing, cyanosis, or edema. Respiratory: Normal respiratory effort, no increased work of breathing. GU: Open wound located on the right hemi-scrotum measuring approximately ***.  Wound bed appears to have heathy granulating tissue.  No signs of of active infection.  No erythema, fluctuance or foul odor. Minimal serosanguinous drainage.  No crepitus or expanding tissue involvement.  Previous packing material removed without difficulty.   Neurologic: Grossly intact, no focal deficits, moving all 4 extremities. Psychiatric: Normal mood and affect.  Laboratory Data: Lab Results  Component Value Date   WBC 15.5 (H) 04/13/2024   HGB 13.7 04/13/2024   HCT 41.6 04/13/2024   MCV 85.4 04/13/2024   PLT 237 04/13/2024    Lab Results  Component Value Date   CREATININE 0.63 04/13/2024    Lab Results  Component Value Date   HGBA1C 5.5 04/13/2024       Component Value Date/Time   CHOL 123 12/01/2023 0000   HDL 31 (L) 12/01/2023 0000   CHOLHDL 4.0 12/01/2023 0000   CHOLHDL 6.6 CALC 12/17/2007 1523   VLDL 33 12/17/2007 1523   LDLCALC 63 12/01/2023 0000    Lab Results  Component Value Date   AST 16 12/01/2023   Lab Results  Component Value Date   ALT 21 12/01/2023    Urinalysis *** See EPIC and HPI  I have reviewed the labs.   Pertinent Imaging: ***  Procedure: Wound gently irrigated with sterile water/saline ***.  Iodoform guaze packed into the wound bed, covered with gauze packing moistened with saline placed in wound bed, dry dressing applied to cover and ABD pad placed on top of wound dressing.    Assessment & Plan:  ***  1. Right scrotal abscess - Wound dressing changed today - continue daily wound packing and dressing changes - advised patient to monitor for signs of infection  (fever, increased drainage, redness, worsening pain) - Follow up in 2 to 3 days for re-evaluation or sooner if symptoms worsen  - pain control with acetaminophen  or ibuprofen  as needed   No follow-ups on file.  These notes generated with voice recognition software. I apologize for typographical errors.  CLOTILDA HELON RIGGERS  [facility] [address]  [address] [place], [place] [address] [phone]

## 2024-04-13 NOTE — Transfer of Care (Addendum)
 Immediate Anesthesia Transfer of Care Note  Patient: Jorge Allen  Procedure(s) Performed: INCISION AND DRAINAGE, ABSCESS (Right: Scrotum)  Patient Location: PACU  Anesthesia Type:General  Level of Consciousness: awake, alert , oriented, and patient cooperative  Airway & Oxygen Therapy: Patient Spontanous Breathing and Patient connected to face mask oxygen  Post-op Assessment: Report given to RN, Post -op Vital signs reviewed and stable, and Patient moving all extremities X 4  Post vital signs: Reviewed and stable  Last Vitals:  Vitals Value Taken Time  BP 96/64 04/13/24 0900  Temp 36.4 04/13/24 0900  Pulse 82 04/13/24 09:00  Resp 20 04/13/24 09:00  SpO2 98 % 04/13/24 09:00  Vitals shown include unfiled device data.  Last Pain:  Vitals:   04/13/24 0751  TempSrc: Oral  PainSc: 0-No pain       Patent airway to PACU, breathing spontaneously on 6L O2 via FM. Pt awake, responsive and comfortable. VSS.   Complications: No notable events documented.

## 2024-04-13 NOTE — Plan of Care (Signed)
  Problem: Education: Goal: Knowledge of General Education information will improve Description: Including pain rating scale, medication(s)/side effects and non-pharmacologic comfort measures Outcome: Progressing   Problem: Clinical Measurements: Goal: Ability to maintain clinical measurements within normal limits will improve Outcome: Progressing Goal: Diagnostic test results will improve Outcome: Progressing Goal: Respiratory complications will improve Outcome: Progressing Goal: Cardiovascular complication will be avoided Outcome: Progressing   Problem: Activity: Goal: Risk for activity intolerance will decrease Outcome: Progressing   Problem: Nutrition: Goal: Adequate nutrition will be maintained Outcome: Progressing   Problem: Coping: Goal: Level of anxiety will decrease Outcome: Progressing   Problem: Elimination: Goal: Will not experience complications related to bowel motility Outcome: Progressing Goal: Will not experience complications related to urinary retention Outcome: Progressing   Problem: Pain Managment: Goal: General experience of comfort will improve and/or be controlled Outcome: Progressing   Problem: Safety: Goal: Ability to remain free from injury will improve Outcome: Progressing   Problem: Skin Integrity: Goal: Risk for impaired skin integrity will decrease Outcome: Progressing   Problem: Fluid Volume: Goal: Hemodynamic stability will improve Outcome: Progressing   Problem: Clinical Measurements: Goal: Diagnostic test results will improve Outcome: Progressing Goal: Signs and symptoms of infection will decrease Outcome: Progressing   Problem: Respiratory: Goal: Ability to maintain adequate ventilation will improve Outcome: Progressing

## 2024-04-13 NOTE — ED Provider Notes (Signed)
 [facility] Provider Note    Event Date/Time   First MD Initiated Contact with Patient 04/13/24 0026     (approximate)   History   Testicle Pain   HPI Jorge Allen is a 43 y.o. male presenting today for testicular pain.  Patient states within the past 3 to 4 days he has noted pain around his right testicle.  He has had increased swelling since that time and noted a spot which looks like a boil.  Did not have any initial drainage.  Denies any fevers or abdominal pain.  No dysuria.  Reports prior history of scrotal abscess which required him to go to the OR.  Chart review: Patient was seen in 2022 for scrotal abscess with Fournier's gangrene and sepsis requiring him to go to the OR with urology.     Physical Exam   Triage Vital Signs: ED Triage Vitals  Encounter Vitals Group     BP 04/12/24 2118 126/77     Girls Systolic BP Percentile --      Girls Diastolic BP Percentile --      Boys Systolic BP Percentile --      Boys Diastolic BP Percentile --      Pulse Rate 04/12/24 2118 93     Resp 04/12/24 2118 18     Temp 04/12/24 2118 98.4 F (36.9 C)     Temp src --      SpO2 04/12/24 2118 97 %     Weight 04/12/24 2117 282 lb (127.9 kg)     Height 04/12/24 2117 5' 5 (1.651 m)     Head Circumference --      Peak Flow --      Pain Score 04/12/24 2117 6     Pain Loc --      Pain Education --      Exclude from Growth Chart --     Most recent vital signs: Vitals:   04/12/24 2118 04/13/24 0204  BP: 126/77 106/60  Pulse: 93 87  Resp: 18 20  Temp: 98.4 F (36.9 C) 99 F (37.2 C)  SpO2: 97% 96%   I have reviewed the vital signs. General:  Awake, alert, no acute distress. Head:  Normocephalic, Atraumatic. EENT:  PERRL, EOMI, Oral mucosa pink and moist, Neck is supple. Cardiovascular: Regular rate, 2+ distal pulses. Respiratory:  Normal respiratory effort, symmetrical expansion, no distress.   Extremities:  Moving all four extremities through  full ROM without pain.   GU: Area of erythema with around the right testicle with mild tenderness to palpation and slight fluctuance. Neuro:  Alert and oriented.  Interacting appropriately.   Skin:  Warm, dry, no rash.   Psych: Appropriate affect.    ED Results / Procedures / Treatments   Labs (all labs ordered are listed, but only abnormal results are displayed) Labs Reviewed  URINALYSIS, ROUTINE W REFLEX MICROSCOPIC - Abnormal; Notable for the following components:      Result Value   Color, Urine STRAW (*)    APPearance CLEAR (*)    Leukocytes,Ua TRACE (*)    All other components within normal limits  CBC WITH DIFFERENTIAL/PLATELET - Abnormal; Notable for the following components:   WBC 15.5 (*)    Neutro Abs 10.8 (*)    Monocytes Absolute 1.1 (*)    All other components within normal limits  BASIC METABOLIC PANEL WITH GFR - Abnormal; Notable for the following components:   Sodium 134 (*)    Glucose,  Bld 129 (*)    BUN 22 (*)    Calcium 8.7 (*)    All other components within normal limits  CHLAMYDIA/NGC RT PCR ([facility] ONLY)               EKG    RADIOLOGY Independently interpreted ultrasound with evidence of scrotal abscess near the right testicle and surrounding cellulitis   PROCEDURES:  Critical Care performed: No  Procedures   MEDICATIONS ORDERED IN ED: Medications  cefTRIAXone (ROCEPHIN) 2 g in sodium chloride  0.9 % 100 mL IVPB (2 g Intravenous New Bag/Given 04/13/24 0201)     IMPRESSION / MDM / ASSESSMENT AND PLAN / ED COURSE  I reviewed the triage vital signs and the nursing notes.                              Differential diagnosis includes, but is not limited to, scrotal abscess, epididymitis, cellulitis  Patient's presentation is most consistent with acute complicated illness / injury requiring diagnostic workup.  Patient is a 43 year old male presenting today for scrotal pain and swelling.  Vital signs stable with no tachycardia or fever.  No  other associated symptoms from a systemic standpoint.  Exam shows area of erythema with centralized boil appearance and tenderness to palpation.  Scrotal ultrasound shows evidence of abscess near the right testicle measuring 2 cm x 2 cm x 1.2 cm with surrounding cellulitis.  Urology consulted for further recommendations given prior history of Fournier's gangrene.  Dr. Twylla recommends admission for IV antibiotics given prior history of Fournier's gangrene, obesity, and type 2 diabetes.  Will start on ceftriaxone and placed NPO.  Plan for evaluation later this morning to decide between bedside I&D versus OR with urology.  Patient admitted to hospitalist for further care.  The patient is on the cardiac monitor to evaluate for evidence of arrhythmia and/or significant heart rate changes. Clinical Course as of 04/13/24 0222  Wed Apr 13, 2024  0113 Consulting urology given scrotal abscess with leukocytosis and prior history of Fournier's gangrene [DW]  0132 Urology - start on IV antibiotics, admit to hospitalist. NPO for possible OR tomorrow [DW]    Clinical Course User Index [DW] Malvina Alm DASEN, MD     FINAL CLINICAL IMPRESSION(S) / ED DIAGNOSES   Final diagnoses:  Scrotal abscess     Rx / DC Orders   ED Discharge Orders     None        Note:  This document was prepared using Dragon voice recognition software and may include unintentional dictation errors.   Malvina Alm DASEN, MD 04/13/24 GARLON

## 2024-04-13 NOTE — Progress Notes (Signed)
 Patient advised by nurse that Surgeon does not want patient driving for 24 hours since he had sedation for his procedure today. Patient calling ride to have someone pick him up from the hospital.

## 2024-04-14 ENCOUNTER — Telehealth: Payer: Self-pay

## 2024-04-14 ENCOUNTER — Ambulatory Visit: Admitting: Physician Assistant

## 2024-04-14 ENCOUNTER — Ambulatory Visit: Admitting: Urology

## 2024-04-14 ENCOUNTER — Encounter: Payer: Self-pay | Admitting: Urology

## 2024-04-14 VITALS — BP 134/77 | HR 78 | Ht 65.0 in | Wt 284.0 lb

## 2024-04-14 DIAGNOSIS — N492 Inflammatory disorders of scrotum: Secondary | ICD-10-CM

## 2024-04-14 NOTE — Transitions of Care (Post Inpatient/ED Visit) (Signed)
 04/14/2024  Name: Jorge Allen MRN: [id number] DOB: 1981-08-26  Today's TOC FU Call Status: Today's TOC FU Call Status:: Successful TOC FU Call Completed TOC FU Call Complete Date: 04/14/24 Patient's Name and Date of Birth confirmed.  Transition Care Management Follow-up Telephone Call Date of Discharge: 04/14/24 Discharge Facility: [facility]) Type of Discharge: Inpatient Admission Primary Inpatient Discharge Diagnosis:: abscess How have you been since you were released from the hospital?: Better Any questions or concerns?: No  Items Reviewed: Did you receive and understand the discharge instructions provided?: Yes Medications obtained,verified, and reconciled?: Yes (Medications Reviewed) Any new allergies since your discharge?: No Dietary orders reviewed?: Yes Do you have support at home?: Yes People in Home [RPT]: spouse  Medications Reviewed Today: Medications Reviewed Today     Reviewed by Emmitt Pan, LPN (Licensed Practical Nurse) on 04/14/24 at 1430  Med List Status: <None>   Medication Order Taking? Sig Documenting Provider Last Dose Status Informant  amoxicillin -clavulanate (AUGMENTIN ) 875-125 MG tablet [id number] Yes Take 1 tablet by mouth 2 (two) times daily for 7 days. Amin, Sumayya, MD  Active   Aspirin -Salicylamide-Caffeine [facility] HEADACHE PO) [id number] Yes Take 1 Package by mouth as needed. [provider]  Active Self  benzonatate  (TESSALON  PERLES) 100 MG capsule [id number] Yes Take 1 capsule (100 mg total) by mouth 3 (three) times daily as needed. Petrina Pries, NP  Active Self  cetirizine  (ZYRTEC  ALLERGY) 10 MG tablet [id number] Yes Take 1 tablet (10 mg total) by mouth daily.  Patient taking differently: Take 10 mg by mouth daily as needed for allergies.   Georgina Speaks, FNP  Active Self  cyclobenzaprine  (FLEXERIL ) 10 MG tablet [id number] Yes Take 10 mg by mouth every 8 (eight) hours as needed. [provider]   Active Self  doxycycline  (VIBRA -TABS) 100 MG tablet [id number] Yes Take 1 tablet (100 mg total) by mouth 2 (two) times daily for 7 days. Caleen Qualia, MD  Active   HYDROcodone -acetaminophen  (NORCO/VICODIN) 5-325 MG tablet [id number] Yes Take 1-2 tablets by mouth every 4 (four) hours as needed for moderate pain (pain score 4-6). Caleen Qualia, MD  Active   hydrOXYzine  (ATARAX ) 10 MG tablet [id number] Yes Take 1 tablet (10 mg total) by mouth 3 (three) times daily as needed. Georgina Speaks, FNP  Active Self  meloxicam  (MOBIC ) 15 MG tablet [id number] Yes Take 1 tablet by mouth once daily Petrina Pries, NP  Active Self  mometasone  (NASONEX ) 50 MCG/ACT nasal spray [id number] Yes Place 2 sprays into the nose daily.  Patient taking differently: Place 2 sprays into the nose daily as needed.   Georgina Speaks, FNP  Active Self  omeprazole  (PRILOSEC  OTC) 20 MG tablet [id number]  Take 1 tablet (20 mg total) by mouth daily.  Patient not taking: Reported on 04/14/2024   Georgina Speaks, FNP  Active Self  SUMAtriptan  (IMITREX ) 50 MG tablet [id number] Yes Take 1 tablet (50 mg total) by mouth every 2 (two) hours as needed for migraine. May repeat in 2 h prn, no more than 2 pills/24 h, no more than 3 pills/week. Athar, Saima, MD  Active Self  tirzepatide  (ZEPBOUND ) 2.5 MG/0.5ML Pen [id number]  Inject 2.5 mg into the skin once a week.  Patient not taking: Reported on 04/14/2024   Georgina Speaks, FNP  Active Self  verapamil  (CALAN ) 80 MG tablet [id number] Yes Take 1 tablet (80 mg total) by mouth 2 (two) times daily.  Patient taking differently: Take 80 mg by mouth 2 (two) times daily  as needed.   Buck Saucer, MD  Active Self  Vitamin D , Ergocalciferol , (DRISDOL ) 1.25 MG (50000 UNIT) CAPS capsule [id number] Yes Take 1 capsule (50,000 Units total) by mouth 2 (two) times a week. Georgina Speaks, FNP  Active Self            Home Care and Equipment/Supplies: Were Home Health Services Ordered?: NA Any new equipment or medical  supplies ordered?: NA  Functional Questionnaire: Do you need assistance with bathing/showering or dressing?: No Do you need assistance with meal preparation?: No Do you need assistance with eating?: No Do you have difficulty maintaining continence: No Do you need assistance with getting out of bed/getting out of a chair/moving?: No Do you have difficulty managing or taking your medications?: No  Follow up appointments reviewed: PCP Follow-up appointment confirmed?: Yes Date of PCP follow-up appointment?: 04/19/24 Follow-up Provider: [facility] Follow-up appointment confirmed?: Yes Date of Specialist follow-up appointment?: 04/14/24 Follow-Up Specialty Provider:: uro Do you need transportation to your follow-up appointment?: No Do you understand care options if your condition(s) worsen?: Yes-patient verbalized understanding    SIGNATURE Julian Lemmings, LPN [facility] Nurse Health Advisor Direct Dial [phone]

## 2024-04-14 NOTE — Progress Notes (Signed)
 Patient presented today for dressing change.  He is POD 1 from I&D of a right scrotal wall abscess with Dr. Twylla.  He reports burning and pain at his surgical site.  I removed his iodoform gauze in its entirety.  Patient tolerated well.  I repacked the wound with quarter inch iodoform gauze and left a tail.  He is very hesitant to repack the wound at home.  I recommended premedication with the Norco he was prescribed yesterday at least for his first few dressing changes.  Ultimately, he agreed to progressively remove about an inch of iodoform packing daily until the packing falls out entirely.  I counseled him to replace the iodoform once it has been entirely removed from the wound.  I sent him home with supplies including iodoform gauze and sterile Q-tips.  Will plan to see him back next week for wound recheck and repacking, though he may cancel this if he is doing well.  Maudine Kluesner, PA-C 04/14/24 5:15 PM

## 2024-04-19 ENCOUNTER — Ambulatory Visit (INDEPENDENT_AMBULATORY_CARE_PROVIDER_SITE_OTHER): Payer: Self-pay | Admitting: Nurse Practitioner

## 2024-04-19 ENCOUNTER — Encounter: Payer: Self-pay | Admitting: Nurse Practitioner

## 2024-04-19 ENCOUNTER — Ambulatory Visit: Admitting: Physician Assistant

## 2024-04-19 VITALS — BP 120/80 | HR 77 | Temp 98.7°F | Ht 65.0 in | Wt 289.0 lb

## 2024-04-19 DIAGNOSIS — E66813 Obesity, class 3: Secondary | ICD-10-CM

## 2024-04-19 DIAGNOSIS — E559 Vitamin D deficiency, unspecified: Secondary | ICD-10-CM

## 2024-04-19 DIAGNOSIS — R7303 Prediabetes: Secondary | ICD-10-CM

## 2024-04-19 DIAGNOSIS — G4733 Obstructive sleep apnea (adult) (pediatric): Secondary | ICD-10-CM

## 2024-04-19 DIAGNOSIS — N492 Inflammatory disorders of scrotum: Secondary | ICD-10-CM | POA: Diagnosis not present

## 2024-04-19 DIAGNOSIS — Z6841 Body Mass Index (BMI) 40.0 and over, adult: Secondary | ICD-10-CM

## 2024-04-19 LAB — AEROBIC/ANAEROBIC CULTURE W GRAM STAIN (SURGICAL/DEEP WOUND)
Culture: NO GROWTH
Gram Stain: NONE SEEN

## 2024-04-19 MED ORDER — MELOXICAM 15 MG PO TABS: 15.0000 mg | ORAL_TABLET | Freq: Every day | ORAL | 2 refills | Status: AC

## 2024-04-19 MED ORDER — MELOXICAM 15 MG PO TABS: 15.0000 mg | ORAL_TABLET | Freq: Every day | ORAL | 0 refills | Status: DC

## 2024-04-19 MED ORDER — VITAMIN D (ERGOCALCIFEROL) 1.25 MG (50000 UNIT) PO CAPS: 50000.0000 [IU] | ORAL_CAPSULE | ORAL | 1 refills | Status: AC

## 2024-04-19 NOTE — Progress Notes (Signed)
 LILLETTE Kristeen JINNY Gladis, CMA,acting as a Neurosurgeon for Jorge Ada, FNP.,have documented all relevant documentation on the behalf of Jorge Ada, FNP,as directed by  Jorge Ada, FNP while in the presence of Jorge Ada, FNP.  Subjective:  Patient ID: Jorge Allen , male    DOB: 02-Aug-1981 , 43 y.o.   MRN: [id number]  Chief Complaint  Patient presents with   Obesity    Patient presents today for a weight check, Patient reports compliance with medication. Patient denies any chest pain, SOB, or headaches. Patient would like to start Zepbound .    Scrotal abscess    Patient reports he had surgery 04/13/2024 he had a Right Scrotal abscess with cellulitis. He reports this is his second time having this. He reports he is feeling fine.     HPI  [name] is a male patient who recently had a hospital admission for a scrotal abscess. He was seen by a surgeon/urologist last Thursday after discharge. The surgical packing has been removed from the site. The patient reports the area is now itching, which I explained is a sign of healing. He had an appointment scheduled today at 1 PM to check if repacking was needed. [name] spent the winter in [place] and has returned for follow-up care. His recent A1C was 5.5, down from 5.9 previously.   This is his second abscess to scrotal area. He is due to see them again today at 2p to see if need new packing. He has itching to the area.   He thinks his last sleep study was done in 2017 at [facility]. He had been a sleep study that was severe sleep apnea     Past Medical History:  Diagnosis Date   Headaches, cluster    Sleep apnea      Family History  Problem Relation Age of Onset   Diabetes Mother    Cancer Mother    Dementia Mother    Hypertension Father    Cancer Father    Glaucoma Father    Other Father        cluster headaches   Dementia Maternal Grandmother    Cancer Maternal Grandfather    Hypertension Paternal Grandmother      Current Outpatient  Medications:    Aspirin -Salicylamide-Caffeine (BC HEADACHE PO), Take 1 Package by mouth as needed., Disp: , Rfl:    benzonatate  (TESSALON  PERLES) 100 MG capsule, Take 1 capsule (100 mg total) by mouth 3 (three) times daily as needed., Disp: 30 capsule, Rfl: 0   cetirizine  (ZYRTEC  ALLERGY) 10 MG tablet, Take 1 tablet (10 mg total) by mouth daily. (Patient taking differently: Take 10 mg by mouth daily as needed for allergies.), Disp: 30 tablet, Rfl: 2   cyclobenzaprine  (FLEXERIL ) 10 MG tablet, Take 10 mg by mouth every 8 (eight) hours as needed., Disp: , Rfl:    HYDROcodone -acetaminophen  (NORCO/VICODIN) 5-325 MG tablet, Take 1-2 tablets by mouth every 4 (four) hours as needed for moderate pain (pain score 4-6)., Disp: 20 tablet, Rfl: 0   hydrOXYzine  (ATARAX ) 10 MG tablet, Take 1 tablet (10 mg total) by mouth 3 (three) times daily as needed., Disp: 30 tablet, Rfl: 2   mometasone  (NASONEX ) 50 MCG/ACT nasal spray, Place 2 sprays into the nose daily. (Patient taking differently: Place 2 sprays into the nose daily as needed.), Disp: 1 each, Rfl: 2   omeprazole  (PRILOSEC  OTC) 20 MG tablet, Take 1 tablet (20 mg total) by mouth daily., Disp: 90 tablet, Rfl: 1   SUMAtriptan  (IMITREX )  50 MG tablet, Take 1 tablet (50 mg total) by mouth every 2 (two) hours as needed for migraine. May repeat in 2 h prn, no more than 2 pills/24 h, no more than 3 pills/week., Disp: 10 tablet, Rfl: 3   verapamil  (CALAN ) 80 MG tablet, Take 1 tablet (80 mg total) by mouth 2 (two) times daily. (Patient taking differently: Take 80 mg by mouth 2 (two) times daily as needed.), Disp: 60 tablet, Rfl: 3   meloxicam  (MOBIC ) 15 MG tablet, Take 1 tablet (15 mg total) by mouth daily., Disp: 30 tablet, Rfl: 2   tirzepatide  (ZEPBOUND ) 2.5 MG/0.5ML Pen, Inject 2.5 mg into the skin once a week. (Patient not taking: Reported on 04/19/2024), Disp: 2 mL, Rfl: 0   Vitamin D , Ergocalciferol , (DRISDOL ) 1.25 MG (50000 UNIT) CAPS capsule, Take 1 capsule (50,000  Units total) by mouth 2 (two) times a week., Disp: 24 capsule, Rfl: 1   No Known Allergies   Review of Systems  Constitutional: Negative.   Eyes: Negative.   Respiratory: Negative.    Cardiovascular: Negative.   Endocrine: Negative for polydipsia, polyphagia and polyuria.  Musculoskeletal: Negative.   Skin: Negative.   Psychiatric/Behavioral: Negative.       Today's Vitals   04/19/24 1125  BP: 120/80  Pulse: 77  Temp: 98.7 F (37.1 C)  TempSrc: Oral  Weight: 289 lb (131.1 kg)  Height: 5' 5 (1.651 m)  PainSc: 10-Worst pain ever  PainLoc: Foot   Body mass index is 48.09 kg/m.  Wt Readings from Last 3 Encounters:  04/19/24 289 lb (131.1 kg)  04/14/24 284 lb (128.8 kg)  04/13/24 282 lb 10.1 oz (128.2 kg)      Objective:  Physical Exam Vitals and nursing note reviewed.  Constitutional:      General: He is not in acute distress.    Appearance: Normal appearance. He is obese.  Cardiovascular:     Rate and Rhythm: Normal rate and regular rhythm.     Pulses: Normal pulses.     Heart sounds: Normal heart sounds. No murmur heard. Pulmonary:     Effort: Pulmonary effort is normal. No respiratory distress.     Breath sounds: Normal breath sounds. No wheezing.  Musculoskeletal:        General: No swelling, tenderness, deformity or signs of injury. Normal range of motion.  Skin:    General: Skin is warm.     Capillary Refill: Capillary refill takes less than 2 seconds.  Neurological:     General: No focal deficit present.     Mental Status: He is alert and oriented to person, place, and time.     Cranial Nerves: No cranial nerve deficit.     Motor: No weakness.         Assessment And Plan:  Right Scrotal abscess with cellulitis,recurrent Assessment & Plan: He had a recent hospitalization for a scrotal mass and is currently receiving treatment from surgery and is to have the wound evaluated and packing removed. TCM Performed. A member of the clinical team spoke with  the patient upon dischare. Discharge summary was reviewed in full detail during the visit. Meds reconciled and compared to discharge meds. Medication list is updated and reviewed with the patient.  Greater than 50% face to face time was spent in counseling an coordination of care.  All questions were answered to the satisfaction of the patient.      OSA on CPAP Assessment & Plan: There is a previous note he had  a sleep study done in 2016 at [facility], we need to get the records to confirm to see if he can get approved for Zepbound    Prediabetes Assessment & Plan: HgbA1c is stable, continue focusing on healthy diet.    Vitamin D  deficiency Assessment & Plan: Will check vitamin D  level and supplement as needed.    Also encouraged to spend 15 minutes in the sun daily.    Orders: -     Vitamin D  (Ergocalciferol ); Take 1 capsule (50,000 Units total) by mouth 2 (two) times a week.  Dispense: 24 capsule; Refill: 1  Class 3 severe obesity due to excess calories with body mass index (BMI) of 45.0 to 49.9 in adult, unspecified whether serious comorbidity present  Other orders -     Meloxicam ; Take 1 tablet (15 mg total) by mouth daily.  Dispense: 30 tablet; Refill: 2    Return for 2 months weight check.  Patient was given opportunity to ask questions. Patient verbalized understanding of the plan and was able to repeat key elements of the plan. All questions were answered to their satisfaction.     LILLETTE Jorge Ada, FNP, have reviewed all documentation for this visit. The documentation on 04/19/24 for the exam, diagnosis, procedures, and orders are all accurate and complete.  IF YOU HAVE BEEN REFERRED TO A SPECIALIST, IT MAY TAKE 1-2 WEEKS TO SCHEDULE/PROCESS THE REFERRAL. IF YOU HAVE NOT HEARD FROM US /SPECIALIST IN TWO WEEKS, PLEASE GIVE US  A CALL AT [phone].

## 2024-04-21 ENCOUNTER — Ambulatory Visit: Admitting: Physician Assistant

## 2024-04-27 NOTE — Progress Notes (Deleted)
 [facility] [address] [place]. [place] [address] [phone]       OFFICE FOLLOW UP NOTE  Jorge Allen Date of Birth:  Oct 04, 1981 Medical Record Number:  [id number]    Primary neurologist: *** Reason for visit: Initial CPAP follow-up    SUBJECTIVE:   CHIEF COMPLAINT:  No chief complaint on file.   Follow-up visit:  Prior visit: 12/31/2023 with Dr. Buck  Brief HPI:   Jorge Allen is a 43 y.o. male who was evaluated by Dr. Buck in 10/2023 for daily headache evaluation with underlying medical history of OSA, morbid obesity, s/p tonsillectomy, turbinate reduction and uvulectomy 2022, vitamin D  deficiency and tobacco use.  Suspected mixed headache, suspected primarily cluster headaches worsened by tobacco and EtOH use but also several other contributing factors including sleep apnea related headaches, rebound and medication overuse headaches.  Advised to trial sumatriptan  as needed for rescue and increased verapamil  to 80 mg twice daily.  He returned 12/31/2023 for sleep apnea evaluation previously followed by [facility] neurology, reporting prior sleep study in 2017 and noted current CPAP compliance. Set up with new CPAP 05/2023.      Interval history:        ROS:   14 system review of systems performed and negative with exception of those listed in HPI  PMH:  Past Medical History:  Diagnosis Date   Headaches, cluster    Sleep apnea     PSH:  Past Surgical History:  Procedure Laterality Date   FOOT SURGERY     INCISION AND DRAINAGE ABSCESS Right 05/14/2021   Procedure: INCISION AND DRAINAGE ABSCESS;  Surgeon: Twylla Glendia BROCKS, MD;  Location: [facility] ORS;  Service: Urology;  Laterality: Right;   INCISION AND DRAINAGE ABSCESS Right 04/13/2024   Procedure: INCISION AND DRAINAGE, ABSCESS;  Surgeon: Twylla Glendia BROCKS, MD;  Location: [facility] ORS;  Service: Urology;  Laterality: Right;  RIGHT SCROTAL ABSCESS   TONSILLECTOMY      Social  History:  Social History   Socioeconomic History   Marital status: Married    Spouse name: Not on file   Number of children: Not on file   Years of education: Not on file   Highest education level: Not on file  Occupational History    Comment: catering  Tobacco Use   Smoking status: Every Day    Current packs/day: 0.50    Average packs/day: 0.5 packs/day for 48.0 years (24.0 ttl pk-yrs)    Types: Cigarettes   Smokeless tobacco: Never  Vaping Use   Vaping status: Every Day   Start date: 10/17/2021   Substances: Nicotine    Devices: Vuse  Substance and Sexual Activity   Alcohol use: Yes    Comment: 2-3 days week; 1 or 2 on those days   Drug use: No   Sexual activity: Yes    Birth control/protection: None  Other Topics Concern   Not on file  Social History Narrative   Caffeine: none    Right handed   Social Drivers of Corporate investment banker Strain: Not on file  Food Insecurity: No Food Insecurity (04/13/2024)   Hunger Vital Sign    Worried About Running Out of Food in the Last Year: Never true    Ran Out of Food in the Last Year: Never true  Transportation Needs: No Transportation Needs (04/13/2024)   PRAPARE - Administrator, Civil Service (Medical): No    Lack of Transportation (Non-Medical): No  Physical Activity:  Not on file  Stress: Not on file  Social Connections: Unknown (03/04/2022)   Received from [facility]   Social Network    Social Network: Not on file  Intimate Partner Violence: Not At Risk (04/13/2024)   Humiliation, Afraid, Rape, and Kick questionnaire    Fear of Current or Ex-Partner: No    Emotionally Abused: No    Physically Abused: No    Sexually Abused: No    Family History:  Family History  Problem Relation Age of Onset   Diabetes Mother    Cancer Mother    Dementia Mother    Hypertension Father    Cancer Father    Glaucoma Father    Other Father        cluster headaches   Dementia Maternal Grandmother    Cancer  Maternal Grandfather    Hypertension Paternal Grandmother     Medications:   Current Outpatient Medications on File Prior to Visit  Medication Sig Dispense Refill   Aspirin -Salicylamide-Caffeine (BC HEADACHE PO) Take 1 Package by mouth as needed.     benzonatate  (TESSALON  PERLES) 100 MG capsule Take 1 capsule (100 mg total) by mouth 3 (three) times daily as needed. 30 capsule 0   cetirizine  (ZYRTEC  ALLERGY) 10 MG tablet Take 1 tablet (10 mg total) by mouth daily. (Patient taking differently: Take 10 mg by mouth daily as needed for allergies.) 30 tablet 2   cyclobenzaprine  (FLEXERIL ) 10 MG tablet Take 10 mg by mouth every 8 (eight) hours as needed.     HYDROcodone -acetaminophen  (NORCO/VICODIN) 5-325 MG tablet Take 1-2 tablets by mouth every 4 (four) hours as needed for moderate pain (pain score 4-6). 20 tablet 0   hydrOXYzine  (ATARAX ) 10 MG tablet Take 1 tablet (10 mg total) by mouth 3 (three) times daily as needed. 30 tablet 2   meloxicam  (MOBIC ) 15 MG tablet Take 1 tablet (15 mg total) by mouth daily. 30 tablet 2   mometasone  (NASONEX ) 50 MCG/ACT nasal spray Place 2 sprays into the nose daily. (Patient taking differently: Place 2 sprays into the nose daily as needed.) 1 each 2   omeprazole  (PRILOSEC  OTC) 20 MG tablet Take 1 tablet (20 mg total) by mouth daily. 90 tablet 1   SUMAtriptan  (IMITREX ) 50 MG tablet Take 1 tablet (50 mg total) by mouth every 2 (two) hours as needed for migraine. May repeat in 2 h prn, no more than 2 pills/24 h, no more than 3 pills/week. 10 tablet 3   tirzepatide  (ZEPBOUND ) 2.5 MG/0.5ML Pen Inject 2.5 mg into the skin once a week. (Patient not taking: Reported on 04/19/2024) 2 mL 0   verapamil  (CALAN ) 80 MG tablet Take 1 tablet (80 mg total) by mouth 2 (two) times daily. (Patient taking differently: Take 80 mg by mouth 2 (two) times daily as needed.) 60 tablet 3   Vitamin D , Ergocalciferol , (DRISDOL ) 1.25 MG (50000 UNIT) CAPS capsule Take 1 capsule (50,000 Units total) by  mouth 2 (two) times a week. 24 capsule 1   No current facility-administered medications on file prior to visit.    Allergies:  No Known Allergies    OBJECTIVE:  Physical Exam  There were no vitals filed for this visit. There is no height or weight on file to calculate BMI. No results found.   General: well developed, well nourished, seated, in no evident distress Head: head normocephalic and atraumatic.   Neck: supple with no carotid or supraclavicular bruits Cardiovascular: regular rate and rhythm, no murmurs  Neurologic  Exam Mental Status: Awake and fully alert. Oriented to place and time. Recent and remote memory intact. Attention span, concentration and fund of knowledge appropriate. Mood and affect appropriate.  Cranial Nerves: Pupils equal, briskly reactive to light. Extraocular movements full without nystagmus. Visual fields full to confrontation. Hearing intact. Facial sensation intact. Face, tongue, palate moves normally and symmetrically.  Motor: Normal bulk and tone. Normal strength in all tested extremity muscles Gait and Station: Arises from chair without difficulty. Stance is normal. Gait demonstrates normal stride length and balance without use of AD.         ASSESSMENT/PLAN: Jorge Allen is a 43 y.o. year old male    OSA on CPAP :  Compliance report shows satisfactory usage with optimal residual AHI.   Continue current pressure settings *** Discussed continued nightly usage with ensuring greater than 4 hours nightly for optimal benefit and per insurance purposes.   Continue to follow with DME company for any needed supplies or CPAP related concerns CPAP set up ***, due for new machine *** Chronic headache, mixed: Prevention: Continue verapamil  Rescue: Continue sumatriptan  as needed, can repeat x1 >2 hrs if needed Reiterated importance of limiting OTC pain relievers to avoid rebound headache     Follow up in *** or call earlier if  needed   CC:  PCP: Georgina Speaks, FNP    I personally spent a total of *** minutes in the care of the patient today including {Time Based Coding:210964241}.     Harlene Bogaert, AGNP-BC  [facility] [address] [place], [place] [address]  Phone [phone] Fax [phone] Note: This document was prepared with digital dictation and possible smart phrase technology. Any transcriptional errors that result from this process are unintentional.

## 2024-04-28 ENCOUNTER — Ambulatory Visit: Payer: 59 | Admitting: Adult Health

## 2024-04-28 ENCOUNTER — Encounter: Payer: Self-pay | Admitting: Adult Health

## 2024-05-08 ENCOUNTER — Encounter: Payer: Self-pay | Admitting: Nurse Practitioner

## 2024-05-08 DIAGNOSIS — E669 Obesity, unspecified: Secondary | ICD-10-CM | POA: Insufficient documentation

## 2024-05-08 NOTE — Assessment & Plan Note (Signed)
 Will check vitamin D level and supplement as needed.    Also encouraged to spend 15 minutes in the sun daily.

## 2024-05-08 NOTE — Assessment & Plan Note (Signed)
 Will see if he can get approved for Wegovy /Zepbound  (due to sleep apnea) but we need his most recent sleep study.

## 2024-05-08 NOTE — Assessment & Plan Note (Signed)
 There is a previous note he had a sleep study done in 2016 at [facility], we need to get the records to confirm to see if he can get approved for Zepbound 

## 2024-05-08 NOTE — Assessment & Plan Note (Signed)
 HgbA1c is stable, continue focusing on healthy diet.

## 2024-05-08 NOTE — Assessment & Plan Note (Signed)
 He had a recent hospitalization for a scrotal mass and is currently receiving treatment from surgery and is to have the wound evaluated and packing removed. TCM Performed. A member of the clinical team spoke with the patient upon dischare. Discharge summary was reviewed in full detail during the visit. Meds reconciled and compared to discharge meds. Medication list is updated and reviewed with the patient.  Greater than 50% face to face time was spent in counseling an coordination of care.  All questions were answered to the satisfaction of the patient.

## 2024-05-10 NOTE — Progress Notes (Signed)
 Updated

## 2024-05-10 NOTE — Addendum Note (Signed)
 Addended by: GEORGINA GAINES FALCON on: 05/10/2024 05:38 PM   Modules accepted: Level of Service

## 2024-06-21 ENCOUNTER — Ambulatory Visit: Admitting: Nurse Practitioner

## 2024-06-21 ENCOUNTER — Ambulatory Visit: Payer: Self-pay

## 2024-06-28 ENCOUNTER — Other Ambulatory Visit

## 2024-06-28 ENCOUNTER — Ambulatory Visit: Payer: Self-pay

## 2024-06-28 DIAGNOSIS — Z111 Encounter for screening for respiratory tuberculosis: Secondary | ICD-10-CM

## 2024-06-30 LAB — QUANTIFERON-TB GOLD PLUS
QuantiFERON Mitogen Value: >10 [IU]/mL
QuantiFERON Nil Value: 0.07 [IU]/mL
QuantiFERON TB1 Ag Value: 0.08 [IU]/mL
QuantiFERON TB2 Ag Value: 0.09 [IU]/mL
QuantiFERON-TB Gold Plus: NEGATIVE

## 2024-07-01 ENCOUNTER — Ambulatory Visit: Payer: Self-pay | Admitting: Nurse Practitioner

## 2024-08-17 ENCOUNTER — Encounter: Payer: Self-pay | Admitting: Nurse Practitioner

## 2024-09-28 ENCOUNTER — Telehealth: Payer: Self-pay | Admitting: Nurse Practitioner

## 2024-09-28 NOTE — Telephone Encounter (Unsigned)
 Copied from CRM #[id number]. Topic: Clinical - Medication Refill >> Sep 28, 2024  5:05 PM Alfonso HERO wrote: Medication: tirzepatide  (ZEPBOUND ) 2.5 MG/0.5ML Pen  Has the patient contacted their pharmacy? No (Agent: If no, request that the patient contact the pharmacy for the refill. If patient does not wish to contact the pharmacy document the reason why and proceed with request.) (Agent: If yes, when and what did the pharmacy advise?)  This is the patient's preferred pharmacy:  [facility] [address] [place], [place] - [address] [place] [address] Phone: [phone] Fax: [phone]   Is this the correct pharmacy for this prescription? Yes If no, delete pharmacy and type the correct one.   Has the prescription been filled recently? Yes  Is the patient out of the medication? Yes  Has the patient been seen for an appointment in the last year OR does the patient have an upcoming appointment? Yes  Can we respond through MyChart? Yes  Agent: Please be advised that Rx refills may take up to 3 business days. We ask that you follow-up with your pharmacy.

## 2024-10-25 ENCOUNTER — Telehealth: Payer: Self-pay

## 2024-10-25 NOTE — Telephone Encounter (Signed)
 Copied from CRM [id number]. Topic: Clinical - Prescription Issue >> Oct 25, 2024  8:43 AM Jorge Allen wrote: Reason for CRM: Patient insurance does not cover Zepbound . Patient would like a new prescription for Ozempic which is on the list of approved medications. [phone]  [facility] - [place], [place] - [address] [place] [address] Phone: [phone] Fax: [phone]

## 2024-10-25 NOTE — Telephone Encounter (Signed)
LVM for pt to sch appt

## 2024-10-26 ENCOUNTER — Ambulatory Visit: Payer: Self-pay | Admitting: Nurse Practitioner

## 2024-10-26 ENCOUNTER — Encounter: Payer: Self-pay | Admitting: Nurse Practitioner

## 2024-10-26 VITALS — BP 120/70 | HR 85 | Temp 98.7°F | Ht 65.0 in | Wt 308.8 lb

## 2024-10-26 DIAGNOSIS — G4733 Obstructive sleep apnea (adult) (pediatric): Secondary | ICD-10-CM | POA: Diagnosis not present

## 2024-10-26 DIAGNOSIS — E66813 Obesity, class 3: Secondary | ICD-10-CM

## 2024-10-26 DIAGNOSIS — K760 Fatty (change of) liver, not elsewhere classified: Secondary | ICD-10-CM | POA: Diagnosis not present

## 2024-10-26 DIAGNOSIS — R0789 Other chest pain: Secondary | ICD-10-CM

## 2024-10-26 DIAGNOSIS — R7303 Prediabetes: Secondary | ICD-10-CM

## 2024-10-26 DIAGNOSIS — Z6841 Body Mass Index (BMI) 40.0 and over, adult: Secondary | ICD-10-CM

## 2024-10-26 LAB — HEMOGLOBIN A1C
Est. average glucose Bld gHb Est-mCnc: 126 mg/dL
Hgb A1c MFr Bld: 6 % — ABNORMAL HIGH (ref 4.8–5.6)

## 2024-10-26 NOTE — Patient Instructions (Signed)
" °  VISIT SUMMARY: Today, we discussed your weight loss options, ongoing rib pain, and prediabetes management. We also reviewed your current diet and exercise habits and explored potential medication options for weight loss and prediabetes.  YOUR PLAN: -CLASS 3 OBESITY: Class 3 obesity means having a body mass index (BMI) of 40 or higher. We discussed weight loss options, including Wegovy  and an upcoming oral medication. You were prescribed Wegovy  at $199/month for two refills. We emphasized the importance of regular physical activity, aiming for at least 150 minutes per week, and maintaining a healthy diet to support weight loss.  -HEPATIC STEATOSIS: Hepatic steatosis, or fatty liver, is the buildup of fat in the liver. This condition can potentially be reversed with weight loss and dietary changes. We ordered labs to assess the current status of your liver and advised you to continue with weight loss and dietary changes to help reverse this condition.  -PREDIABETES: Prediabetes means your blood sugar levels are higher than normal but not high enough to be classified as diabetes. We discussed that your insurance does not cover certain medications unless diabetes is diagnosed. We will continue to monitor your blood glucose levels and A1c, and if diabetes is diagnosed, we can explore medication options that may be covered by your insurance.  -RIB PAIN WITH PLEURISY: Pleurisy is inflammation of the tissues that line the lungs and chest cavity, causing pain when breathing. We ordered a chest x-ray to rule out other causes of your pain and recommended taking meloxicam  once daily for one week to manage inflammation and pain.  INSTRUCTIONS: Please follow up with the recommended chest x-ray and lab tests. Continue taking meloxicam  as prescribed for your rib pain. Maintain regular physical activity and a healthy diet to support weight loss and manage your prediabetes. We will monitor your blood glucose levels and  A1c, and if diabetes is diagnosed, we can discuss further medication options. Follow up with us  as needed for any concerns or if your symptoms change.    Contains text generated by Abridge.    "

## 2024-10-26 NOTE — Progress Notes (Signed)
 Jorge Allen, CMA,acting as a neurosurgeon for Jorge Ada, FNP.,have documented all relevant documentation on the behalf of Jorge Ada, FNP,as directed by  Jorge Ada, FNP while in the presence of Jorge Ada, FNP.  Subjective:  Patient ID: Jorge Allen , male    DOB: 26-Sep-1981 , 44 y.o.   MRN: [id number]  Chief Complaint  Patient presents with   Obesity    Patient presents today for wanting to start a weight loss medication. He reports his insurance doesn't cover zepbound . He would like to try something else today.      Here to discuss weight loss options - he does tend to eat what his daughter eats, tries to eat more vegetables. He does drink energy drinks and water. 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 He does not watch much TV.     Discussed the use of AI scribe software for clinical note transcription with the patient, who gave verbal consent to proceed.  History of Present Illness Jorge Allen is a 44 year old male who presents for follow-up and assistance with weight loss.  He has been approved for Zetban for weight loss, but it is not covered by his insurance, making it financially inaccessible. He is considering other options such as Wegovy  and an oral medication that is not yet available in the system.  His current diet includes convenient foods similar to what his daughter eats, such as nuggets, fries, and pizza, though he incorporates vegetables and sandwiches. He primarily drinks water and sugar-free energy drinks. 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 He experiences foot pain but does not attribute his lack of exercise to it.  He experiences pain when breathing, particularly in the morning, which has been ongoing for about a month. The pain is described as being located 'all up in through here and all the way around to the side.' He has not been taking  any specific medication for this pain, although he occasionally uses muscle relaxers prescribed previously. He also has meloxicam  for foot pain but has not used it recently.  He has a history of prediabetes and is inquiring about medication options related to this condition. He is aware that his insurance does not cover certain medications unless specific conditions like diabetes or fatty liver are present. He is concerned about the cost of medications and the potential need for long-term use.  Past Medical History:  Diagnosis Date   Headaches, cluster    Sleep apnea      Family History  Problem Relation Age of Onset   Diabetes Mother    Cancer Mother    Dementia Mother    Hypertension Father    Cancer Father    Glaucoma Father    Other Father        cluster headaches   Dementia Maternal Grandmother    Cancer Maternal Grandfather    Hypertension Paternal Grandmother     Current Medications[1]   Allergies[2]   Review of Systems  Constitutional: Negative.   Eyes: Negative.   Respiratory: Negative.    Cardiovascular: Negative.   Endocrine: Negative for polydipsia, polyphagia and polyuria.  Musculoskeletal: Negative.   Skin: Negative.   Psychiatric/Behavioral: Negative.       Today's Vitals   10/26/24 1639  BP: 120/70  Pulse: 85  Temp: 98.7 F (37.1 C)  TempSrc: Oral  Weight: (!) 308 lb 12.8 oz (  140.1 kg)  Height: 5' 5 (1.651 m)  PainSc: 0-No pain   Body mass index is 51.39 kg/m.  Wt Readings from Last 3 Encounters:  10/26/24 (!) 308 lb 12.8 oz (140.1 kg)  04/19/24 289 lb (131.1 kg)  04/14/24 284 lb (128.8 kg)     Objective:  Physical Exam Vitals and nursing note reviewed.  Constitutional:      General: He is not in acute distress.    Appearance: Normal appearance. He is obese.  Cardiovascular:     Rate and Rhythm: Normal rate and regular rhythm.     Pulses: Normal pulses.     Heart sounds: Normal heart sounds. No murmur heard. Pulmonary:     Effort:  Pulmonary effort is normal. No respiratory distress.     Breath sounds: Normal breath sounds. No wheezing.  Musculoskeletal:        General: No swelling, tenderness, deformity or signs of injury. Normal range of motion.  Skin:    General: Skin is warm.     Capillary Refill: Capillary refill takes less than 2 seconds.  Neurological:     General: No focal deficit present.     Mental Status: He is alert and oriented to person, place, and time.     Cranial Nerves: No cranial nerve deficit.     Motor: No weakness.      Assessment And Plan:   Assessment & Plan Prediabetes Insurance does not cover GLP-1 receptor agonists without diabetes diagnosis. Explained potential for coverage if diabetes is diagnosed. - Continue to monitor blood glucose levels and A1c. - Discussed potential for insurance coverage if diabetes is diagnosed. OSA on CPAP He is encouraged to wear his CPAP regularly Hepatic steatosis Potential for reversal with weight loss and dietary changes. Long-term fatty liver can lead to irreversible cirrhosis. - Ordered labs to assess current status of hepatic steatosis. - Advised on weight loss and dietary changes to reverse hepatic steatosis. Rib pain Likely due to inflammation in pleural spaces. Pain exacerbated by deep breathing. Differential includes pleuritis. No shortness of breath reported. Discussed meloxicam  for inflammation management. - Ordered chest x-ray to rule out other causes of pleuritic pain. - Recommended meloxicam  once daily for one week to manage inflammation and pain. Class 3 severe obesity due to excess calories without serious comorbidity with body mass index (BMI) of 50.0 to 59.9 in adult Jorge Allen) Discussed weight loss options. Insurance does not cover Zepbound . Discussed Wegovy  and oral GLP-1 receptor agonists. Emphasized diet and exercise for sustainable weight loss. Potential 20% weight loss in a year with medication. Highlighted need for regular physical  activity if medication becomes unaffordable. - Prescribed Wegovy  at $199/month for two refills. - Plan for oral GLP-1 receptor agonist when available in system. - Encouraged regular physical activity, at least 150 minutes per week. - Advised on maintaining a healthy diet to support weight loss.  Orders Placed This Encounter  Procedures   DG Chest 2 View   Hemoglobin A1c   FIB-4     Return for keep same next.   Patient was given opportunity to ask questions. Patient verbalized understanding of the plan and was able to repeat key elements of the plan. All questions were answered to their satisfaction.    Jorge Jorge Ada, FNP, have reviewed all documentation for this visit. The documentation on 10/26/24 for the exam, diagnosis, procedures, and orders are all accurate and complete.   IF YOU HAVE BEEN REFERRED TO A SPECIALIST, IT MAY TAKE 1-2 WEEKS TO  SCHEDULE/PROCESS THE REFERRAL. IF YOU HAVE NOT HEARD FROM US /SPECIALIST IN TWO WEEKS, PLEASE GIVE US  A CALL AT [phone] X [phone].      [1]  Current Outpatient Medications:    Aspirin -Salicylamide-Caffeine (BC HEADACHE PO), Take 1 Package by mouth as needed., Disp: , Rfl:    cetirizine  (ZYRTEC  ALLERGY) 10 MG tablet, Take 1 tablet (10 mg total) by mouth daily. (Patient taking differently: Take 10 mg by mouth daily as needed for allergies.), Disp: 30 tablet, Rfl: 2   cyclobenzaprine  (FLEXERIL ) 10 MG tablet, Take 10 mg by mouth every 8 (eight) hours as needed., Disp: , Rfl:    HYDROcodone -acetaminophen  (NORCO/VICODIN) 5-325 MG tablet, Take 1-2 tablets by mouth every 4 (four) hours as needed for moderate pain (pain score 4-6)., Disp: 20 tablet, Rfl: 0   hydrOXYzine  (ATARAX ) 10 MG tablet, Take 1 tablet (10 mg total) by mouth 3 (three) times daily as needed., Disp: 30 tablet, Rfl: 2   meloxicam  (MOBIC ) 15 MG tablet, Take 1 tablet (15 mg total) by mouth daily., Disp: 30 tablet, Rfl: 2   mometasone  (NASONEX ) 50 MCG/ACT nasal spray, Place 2 sprays  into the nose daily. (Patient taking differently: Place 2 sprays into the nose daily as needed.), Disp: 1 each, Rfl: 2   omeprazole  (PRILOSEC  OTC) 20 MG tablet, Take 1 tablet (20 mg total) by mouth daily., Disp: 90 tablet, Rfl: 1   SUMAtriptan  (IMITREX ) 50 MG tablet, Take 1 tablet (50 mg total) by mouth every 2 (two) hours as needed for migraine. May repeat in 2 h prn, no more than 2 pills/24 h, no more than 3 pills/week., Disp: 10 tablet, Rfl: 3   verapamil  (CALAN ) 80 MG tablet, Take 1 tablet (80 mg total) by mouth 2 (two) times daily. (Patient taking differently: Take 80 mg by mouth 2 (two) times daily as needed.), Disp: 60 tablet, Rfl: 3   Vitamin D , Ergocalciferol , (DRISDOL ) 1.25 MG (50000 UNIT) CAPS capsule, Take 1 capsule (50,000 Units total) by mouth 2 (two) times a week., Disp: 24 capsule, Rfl: 1 [2] No Known Allergies

## 2024-10-27 LAB — FIB-4
ALT: 27 IU/L (ref 0–44)
AST: 15 IU/L (ref 0–40)
FIB-4 Index: 0.53 (ref 0.00–2.67)
Platelets: 240 x10E3/uL (ref 150–450)

## 2024-11-06 ENCOUNTER — Ambulatory Visit: Payer: Self-pay | Admitting: Nurse Practitioner

## 2024-11-06 DIAGNOSIS — R0789 Other chest pain: Secondary | ICD-10-CM | POA: Insufficient documentation

## 2024-11-06 DIAGNOSIS — K760 Fatty (change of) liver, not elsewhere classified: Secondary | ICD-10-CM | POA: Insufficient documentation

## 2024-11-06 NOTE — Assessment & Plan Note (Signed)
 Likely due to inflammation in pleural spaces. Pain exacerbated by deep breathing. Differential includes pleuritis. No shortness of breath reported. Discussed meloxicam  for inflammation management. - Ordered chest x-ray to rule out other causes of pleuritic pain. - Recommended meloxicam  once daily for one week to manage inflammation and pain.

## 2024-11-06 NOTE — Assessment & Plan Note (Signed)
 Insurance does not cover GLP-1 receptor agonists without diabetes diagnosis. Explained potential for coverage if diabetes is diagnosed. - Continue to monitor blood glucose levels and A1c. - Discussed potential for insurance coverage if diabetes is diagnosed.

## 2024-11-06 NOTE — Assessment & Plan Note (Signed)
 Discussed weight loss options. Insurance does not cover Zepbound . Discussed Wegovy  and oral GLP-1 receptor agonists. Emphasized diet and exercise for sustainable weight loss. Potential 20% weight loss in a year with medication. Highlighted need for regular physical activity if medication becomes unaffordable. - Prescribed Wegovy  at $199/month for two refills. - Plan for oral GLP-1 receptor agonist when available in system. - Encouraged regular physical activity, at least 150 minutes per week. - Advised on maintaining a healthy diet to support weight loss.

## 2024-11-06 NOTE — Assessment & Plan Note (Signed)
 Potential for reversal with weight loss and dietary changes. Long-term fatty liver can lead to irreversible cirrhosis. - Ordered labs to assess current status of hepatic steatosis. - Advised on weight loss and dietary changes to reverse hepatic steatosis.

## 2024-11-06 NOTE — Assessment & Plan Note (Signed)
 He is encouraged to wear his CPAP regularly

## 2024-11-07 ENCOUNTER — Other Ambulatory Visit: Payer: Self-pay

## 2024-11-08 ENCOUNTER — Other Ambulatory Visit: Payer: Self-pay | Admitting: Nurse Practitioner

## 2024-11-08 ENCOUNTER — Other Ambulatory Visit: Payer: Self-pay

## 2024-11-08 DIAGNOSIS — Z6841 Body Mass Index (BMI) 40.0 and over, adult: Secondary | ICD-10-CM

## 2024-11-08 MED ORDER — WEGOVY 1.5 MG PO TABS: 1.5000 mg | ORAL_TABLET | Freq: Every day | ORAL | 1 refills | Status: DC

## 2024-11-08 NOTE — Telephone Encounter (Signed)
 Please schedule a 2 month f/u for weight check.

## 2024-11-21 ENCOUNTER — Emergency Department (HOSPITAL_BASED_OUTPATIENT_CLINIC_OR_DEPARTMENT_OTHER): Admission: EM | Admit: 2024-11-21 | Discharge: 2024-11-21 | Disposition: A | Discharge status: Home / Self Care

## 2024-11-21 ENCOUNTER — Emergency Department (HOSPITAL_BASED_OUTPATIENT_CLINIC_OR_DEPARTMENT_OTHER): Admitting: Radiology

## 2024-11-21 ENCOUNTER — Emergency Department (HOSPITAL_BASED_OUTPATIENT_CLINIC_OR_DEPARTMENT_OTHER)

## 2024-11-21 ENCOUNTER — Other Ambulatory Visit: Payer: Self-pay

## 2024-11-21 ENCOUNTER — Encounter (HOSPITAL_BASED_OUTPATIENT_CLINIC_OR_DEPARTMENT_OTHER): Payer: Self-pay

## 2024-11-21 DIAGNOSIS — Z7982 Long term (current) use of aspirin: Secondary | ICD-10-CM | POA: Insufficient documentation

## 2024-11-21 DIAGNOSIS — E66813 Obesity, class 3: Secondary | ICD-10-CM

## 2024-11-21 DIAGNOSIS — R079 Chest pain, unspecified: Secondary | ICD-10-CM

## 2024-11-21 DIAGNOSIS — R072 Precordial pain: Secondary | ICD-10-CM | POA: Insufficient documentation

## 2024-11-21 DIAGNOSIS — D72829 Elevated white blood cell count, unspecified: Secondary | ICD-10-CM | POA: Insufficient documentation

## 2024-11-21 LAB — CBC
HCT: 42 % (ref 39.0–52.0)
Hemoglobin: 13.9 g/dL (ref 13.0–17.0)
MCH: 28.1 pg (ref 26.0–34.0)
MCHC: 33.1 g/dL (ref 30.0–36.0)
MCV: 85 fL (ref 80.0–100.0)
Platelets: 244 10*3/uL (ref 150–400)
RBC: 4.94 MIL/uL (ref 4.22–5.81)
RDW: 14.4 % (ref 11.5–15.5)
WBC: 10.9 10*3/uL — ABNORMAL HIGH (ref 4.0–10.5)
nRBC: 0 % (ref 0.0–0.2)

## 2024-11-21 LAB — BASIC METABOLIC PANEL WITH GFR
Anion gap: 12 (ref 5–15)
BUN: 11 mg/dL (ref 6–20)
CO2: 25 mmol/L (ref 22–32)
Calcium: 9.7 mg/dL (ref 8.9–10.3)
Chloride: 101 mmol/L (ref 98–111)
Creatinine, Ser: 0.75 mg/dL (ref 0.61–1.24)
GFR, Estimated: >60 mL/min
Glucose, Bld: 134 mg/dL — ABNORMAL HIGH (ref 70–99)
Potassium: 3.7 mmol/L (ref 3.5–5.1)
Sodium: 138 mmol/L (ref 135–145)

## 2024-11-21 LAB — TROPONIN T, HIGH SENSITIVITY
Troponin T High Sensitivity: 6 ng/L (ref 0–19)
Troponin T High Sensitivity: 9 ng/L (ref 0–19)

## 2024-11-21 MED ORDER — MORPHINE SULFATE (PF) 2 MG/ML IV SOLN
2.0000 mg | Freq: Once | INTRAVENOUS | Status: AC
  Administered 2024-11-21: 2 mg via INTRAVENOUS
  Filled 2024-11-21: qty 1

## 2024-11-21 MED ORDER — PANTOPRAZOLE SODIUM 40 MG PO TBEC: 40.0000 mg | DELAYED_RELEASE_TABLET | Freq: Every day | ORAL | 0 refills | Status: DC

## 2024-11-21 MED ORDER — WEGOVY 1.5 MG PO TABS: 1.5000 mg | ORAL_TABLET | Freq: Every day | ORAL | 1 refills | Status: AC

## 2024-11-21 MED ORDER — ALUM & MAG HYDROXIDE-SIMETH 200-200-20 MG/5ML PO SUSP
30.0000 mL | Freq: Once | ORAL | Status: AC
  Administered 2024-11-21: 30 mL via ORAL
  Filled 2024-11-21: qty 30

## 2024-11-21 MED ORDER — MORPHINE SULFATE (PF) 4 MG/ML IV SOLN
4.0000 mg | Freq: Once | INTRAVENOUS | Status: AC
  Administered 2024-11-21: 4 mg via INTRAVENOUS
  Filled 2024-11-21: qty 1

## 2024-11-21 MED ORDER — LIDOCAINE VISCOUS HCL 2 % MT SOLN
15.0000 mL | Freq: Once | OROMUCOSAL | Status: AC
  Administered 2024-11-21: 15 mL via ORAL
  Filled 2024-11-21: qty 15

## 2024-11-21 MED ORDER — ONDANSETRON HCL 4 MG/2ML IJ SOLN
4.0000 mg | Freq: Once | INTRAMUSCULAR | Status: AC
  Administered 2024-11-21: 4 mg via INTRAVENOUS
  Filled 2024-11-21: qty 2

## 2024-11-21 MED ORDER — IOHEXOL 350 MG/ML SOLN
100.0000 mL | Freq: Once | INTRAVENOUS | Status: AC | PRN
  Administered 2024-11-21: 100 mL via INTRAVENOUS

## 2024-11-21 NOTE — Discharge Instructions (Signed)

## 2024-11-21 NOTE — ED Notes (Signed)
 Pt requesting to speak to provider again prior to discharge

## 2024-11-21 NOTE — ED Triage Notes (Signed)
 Pt states that he has had sharp intermittent midsternal chest pain that radiates into mid-back x 1 month with worsening x 1 day. Pt reports that pain worsens with certain movements. Pt also states that the pain takes his breath away.

## 2024-11-22 ENCOUNTER — Ambulatory Visit: Payer: Self-pay

## 2024-11-22 ENCOUNTER — Ambulatory Visit

## 2024-11-22 ENCOUNTER — Ambulatory Visit: Admitting: Nurse Practitioner

## 2024-11-22 VITALS — BP 120/60 | HR 94 | Temp 98.2°F | Ht 65.0 in | Wt 304.0 lb

## 2024-11-22 DIAGNOSIS — Z8719 Personal history of other diseases of the digestive system: Secondary | ICD-10-CM

## 2024-11-22 DIAGNOSIS — R079 Chest pain, unspecified: Secondary | ICD-10-CM

## 2024-11-22 DIAGNOSIS — J40 Bronchitis, not specified as acute or chronic: Secondary | ICD-10-CM

## 2024-11-22 DIAGNOSIS — R9431 Abnormal electrocardiogram [ECG] [EKG]: Secondary | ICD-10-CM

## 2024-11-22 DIAGNOSIS — R10816 Epigastric abdominal tenderness: Secondary | ICD-10-CM

## 2024-11-22 DIAGNOSIS — Z720 Tobacco use: Secondary | ICD-10-CM

## 2024-11-22 MED ORDER — PANTOPRAZOLE SODIUM 40 MG PO TBEC: 40.0000 mg | DELAYED_RELEASE_TABLET | Freq: Every day | ORAL | 0 refills | Status: AC

## 2024-11-22 MED ORDER — NITROGLYCERIN 0.4 MG SL SUBL: 0.4000 mg | SUBLINGUAL_TABLET | SUBLINGUAL | 0 refills | Status: AC | PRN

## 2024-11-22 NOTE — Progress Notes (Unsigned)
 "  Office Visit    Patient Name: Jorge Allen Date of Encounter: 11/22/2024  Primary Care Provider:  Georgina Speaks, FNP Primary Cardiologist:  None  Chief Complaint    ***  Past Medical History    Past Medical History:  Diagnosis Date   Headaches, cluster    Sleep apnea    Past Surgical History:  Procedure Laterality Date   FOOT SURGERY     INCISION AND DRAINAGE ABSCESS Right 05/14/2021   Procedure: INCISION AND DRAINAGE ABSCESS;  Surgeon: Twylla Glendia BROCKS, MD;  Location: [facility];  Service: Urology;  Laterality: Right;   INCISION AND DRAINAGE ABSCESS Right 04/13/2024   Procedure: INCISION AND DRAINAGE, ABSCESS;  Surgeon: Twylla Glendia BROCKS, MD;  Location: [facility];  Service: Urology;  Laterality: Right;  RIGHT SCROTAL ABSCESS   TONSILLECTOMY      Allergies  Allergies[1]   Labs/Other Studies Reviewed    The following studies were reviewed today: ***    Recent Labs: 10/26/2024: ALT 27 11/21/2024: BUN 11; Creatinine, Ser 0.75; Hemoglobin 13.9; Platelets 244; Potassium 3.7; Sodium 138  Recent Lipid Panel    Component Value Date/Time   CHOL 123 12/01/2023 0000   TRIG 169 (H) 12/01/2023 0000   HDL 31 (L) 12/01/2023 0000   CHOLHDL 4.0 12/01/2023 0000   CHOLHDL 6.6 CALC 12/17/2007 1523   VLDL 33 12/17/2007 1523   LDLCALC 63 12/01/2023 0000    History of Present Illness    44 year old male with a history of prediabetes, OSA, obesity, and tobacco use (vapes)  He was seen in the ED on 11/21/2024 in the setting of chest pain that radiated to his back, intermittent nausea.  BP was mildly elevated.  Troponin was negative x 2.  Labs were otherwise unremarkable.  EKG showed new right bundle branch block, otherwise unremarkable.  Chest x-ray showed changes associated with bronchitis.   CT angio the chest was negative for dissection, PE, lungs were without acute process.  Incidental finding of hepatic steatosis.  It was noted that his symptoms were possibly related to GERD.  He was  discharged home in stable condition advised to follow-up with cardiology as an outpatient.  [facility] for [facility].   CC: -Initial onset:  -Frequency/Duration:  -Associated symptoms:  -Aggravating/alleviating factors:  -Syncope/near syncope: -Prior cardiac history: -Prior ECG:  -Prior workup: -Prior treatment:  -Possible medication interactions:  -Caffeine:  -Alcohol:  -Tobacco: -OTC supplements:  -Comorbidities:  -Exercise level:  -Labs:  -Cardiac ROS: no chest pain, no shortness of breath, no PND, no orthopnea, no LE edema. She denies any headaches. -Family history:   -Specialists:    Home Medications    Current Outpatient Medications  Medication Sig Dispense Refill   Aspirin -Salicylamide-Caffeine (BC HEADACHE PO) Take 1 Package by mouth as needed.     cetirizine  (ZYRTEC  ALLERGY) 10 MG tablet Take 1 tablet (10 mg total) by mouth daily. (Patient taking differently: Take 10 mg by mouth daily as needed for allergies.) 30 tablet 2   cyclobenzaprine  (FLEXERIL ) 10 MG tablet Take 10 mg by mouth every 8 (eight) hours as needed.     HYDROcodone -acetaminophen  (NORCO/VICODIN) 5-325 MG tablet Take 1-2 tablets by mouth every 4 (four) hours as needed for moderate pain (pain score 4-6). 20 tablet 0   hydrOXYzine  (ATARAX ) 10 MG tablet Take 1 tablet (10 mg total) by mouth 3 (three) times daily as needed. 30 tablet 2   meloxicam  (MOBIC ) 15 MG tablet Take 1 tablet (15  mg total) by mouth daily. 30 tablet 2   mometasone  (NASONEX ) 50 MCG/ACT nasal spray Place 2 sprays into the nose daily. (Patient taking differently: Place 2 sprays into the nose daily as needed.) 1 each 2   omeprazole  (PRILOSEC  OTC) 20 MG tablet Take 1 tablet (20 mg total) by mouth daily. 90 tablet 1   pantoprazole  (PROTONIX ) 40 MG tablet Take 1 tablet (40 mg total) by mouth daily. 30 tablet 0   semaglutide -weight management (WEGOVY ) 1.5 MG tablet Take 1 tablet (1.5 mg total) by mouth  daily. Daily in AM on an empty stomach with 4 oz of water. Do not eat or drink for 30 minutes after dose. 30 tablet 1   SUMAtriptan  (IMITREX ) 50 MG tablet Take 1 tablet (50 mg total) by mouth every 2 (two) hours as needed for migraine. May repeat in 2 h prn, no more than 2 pills/24 h, no more than 3 pills/week. 10 tablet 3   verapamil  (CALAN ) 80 MG tablet Take 1 tablet (80 mg total) by mouth 2 (two) times daily. (Patient taking differently: Take 80 mg by mouth 2 (two) times daily as needed.) 60 tablet 3   Vitamin D , Ergocalciferol , (DRISDOL ) 1.25 MG (50000 UNIT) CAPS capsule Take 1 capsule (50,000 Units total) by mouth 2 (two) times a week. 24 capsule 1   No current facility-administered medications for this visit.     Review of Systems    ***.  All other systems reviewed and are otherwise negative except as noted above.    Physical Exam    VS:  There were no vitals taken for this visit. , BMI There is no height or weight on file to calculate BMI.     GEN: Well nourished, well developed, in no acute distress. HEENT: normal. Neck: Supple, no JVD, carotid bruits, or masses. Cardiac: RRR, no murmurs, rubs, or gallops. No clubbing, cyanosis, edema.  Radials/DP/PT 2+ and equal bilaterally.  Respiratory:  Respirations regular and unlabored, clear to auscultation bilaterally. GI: Soft, nontender, nondistended, BS + x 4. MS: no deformity or atrophy. Skin: warm and dry, no rash. Neuro:  Strength and sensation are intact. Psych: Normal affect.  Accessory Clinical Findings    ECG personally reviewed by me today -    - no acute changes.   Lab Results  Component Value Date   WBC 10.9 (H) 11/21/2024   HGB 13.9 11/21/2024   HCT 42.0 11/21/2024   MCV 85.0 11/21/2024   PLT 244 11/21/2024   Lab Results  Component Value Date   CREATININE 0.75 11/21/2024   BUN 11 11/21/2024   NA 138 11/21/2024   K 3.7 11/21/2024   CL 101 11/21/2024   CO2 25 11/21/2024   Lab Results  Component Value Date    ALT 27 10/26/2024   AST 15 10/26/2024   ALKPHOS 95 12/01/2023   BILITOT 0.4 12/01/2023   Lab Results  Component Value Date   CHOL 123 12/01/2023   HDL 31 (L) 12/01/2023   LDLCALC 63 12/01/2023   TRIG 169 (H) 12/01/2023   CHOLHDL 4.0 12/01/2023    Lab Results  Component Value Date   HGBA1C 6.0 (H) 10/26/2024    Assessment & Plan    1.  ***      Damien JAYSON Braver, NP 11/22/2024, 11:49 AM       [1] No Known Allergies  "

## 2024-11-22 NOTE — Progress Notes (Signed)
 I,Jorge Allen, CMA,acting as a neurosurgeon for Jorge JONELLE Fischer, DO.,have documented all relevant documentation on the behalf of Jorge JONELLE Fischer, DO,as directed by  Jorge JONELLE Fischer, DO while in the presence of Jorge JONELLE Fischer, DO.  Subjective:  Patient ID: Jorge Allen , male    DOB: 09-16-1981 , 44 y.o.   MRN: [id number]  Chief Complaint  Patient presents with   Shortness of Breath    Patient presents today for a ER f/u. Patient reports he was having chest pain and sob. He reports they told him he has 2 collapsed lungs and they also said he feels like he has acid reflux but he doesn't feel like it is that.     HPI  HPI   Past Medical History:  Diagnosis Date   Headaches, cluster    Sleep apnea      Family History  Problem Relation Age of Onset   Diabetes Mother    Cancer Mother    Dementia Mother    Hypertension Father    Cancer Father    Glaucoma Father    Other Father        cluster headaches   Dementia Maternal Grandmother    Cancer Maternal Grandfather    Hypertension Paternal Grandmother     Current Medications[1]   Allergies[2]   Review of Systems   Today's Vitals   11/22/24 1432  Temp: 98.2 F (36.8 C)  TempSrc: Oral  Weight: (!) 304 lb (137.9 kg)  Height: 5' 5 (1.651 m)   Body mass index is 50.59 kg/m.  Wt Readings from Last 3 Encounters:  11/22/24 (!) 304 lb (137.9 kg)  10/26/24 (!) 308 lb 12.8 oz (140.1 kg)  04/19/24 289 lb (131.1 kg)    The ASCVD Risk score (Arnett DK, et al., 2019) failed to calculate for the following reasons:   The valid total cholesterol range is 130 to 320 mg/dL  Objective:  Physical Exam      Assessment And Plan:   Assessment & Plan Abnormal finding on EKG  Bronchitis   No orders of the defined types were placed in this encounter.    No follow-ups on file.  Patient was given opportunity to ask questions. Patient verbalized understanding of the plan and was able to repeat key elements of the plan. All questions  were answered to their satisfaction.    Jorge Allen Jorge JONELLE Fischer, DO, have reviewed all documentation for this visit. The documentation on 11/22/24 for the exam, diagnosis, procedures, and orders are all accurate and complete.   IF YOU HAVE BEEN REFERRED TO A SPECIALIST, IT MAY TAKE 1-2 WEEKS TO SCHEDULE/PROCESS THE REFERRAL. IF YOU HAVE NOT HEARD FROM US /SPECIALIST IN TWO WEEKS, PLEASE GIVE US  A CALL AT [phone] X [phone].      [1]  Current Outpatient Medications:    Aspirin -Salicylamide-Caffeine (BC HEADACHE PO), Take 1 Package by mouth as needed., Disp: , Rfl:    cyclobenzaprine  (FLEXERIL ) 10 MG tablet, Take 10 mg by mouth every 8 (eight) hours as needed., Disp: , Rfl:    hydrOXYzine  (ATARAX ) 10 MG tablet, Take 1 tablet (10 mg total) by mouth 3 (three) times daily as needed., Disp: 30 tablet, Rfl: 2   meloxicam  (MOBIC ) 15 MG tablet, Take 1 tablet (15 mg total) by mouth daily., Disp: 30 tablet, Rfl: 2   mometasone  (NASONEX ) 50 MCG/ACT nasal spray, Place 2 sprays into the nose daily., Disp: 1 each, Rfl: 2   semaglutide -weight management (WEGOVY ) 1.5 MG  tablet, Take 1 tablet (1.5 mg total) by mouth daily. Daily in AM on an empty stomach with 4 oz of water. Do not eat or drink for 30 minutes after dose., Disp: 30 tablet, Rfl: 1   Vitamin D , Ergocalciferol , (DRISDOL ) 1.25 MG (50000 UNIT) CAPS capsule, Take 1 capsule (50,000 Units total) by mouth 2 (two) times a week., Disp: 24 capsule, Rfl: 1   cetirizine  (ZYRTEC  ALLERGY) 10 MG tablet, Take 1 tablet (10 mg total) by mouth daily. (Patient not taking: Reported on 11/22/2024), Disp: 30 tablet, Rfl: 2   omeprazole  (PRILOSEC  OTC) 20 MG tablet, Take 1 tablet (20 mg total) by mouth daily. (Patient not taking: Reported on 11/22/2024), Disp: 90 tablet, Rfl: 1   pantoprazole  (PROTONIX ) 40 MG tablet, Take 1 tablet (40 mg total) by mouth daily. (Patient not taking: Reported on 11/22/2024), Disp: 30 tablet, Rfl: 0   SUMAtriptan  (IMITREX ) 50 MG tablet, Take 1 tablet (50 mg  total) by mouth every 2 (two) hours as needed for migraine. May repeat in 2 h prn, no more than 2 pills/24 h, no more than 3 pills/week. (Patient not taking: Reported on 11/22/2024), Disp: 10 tablet, Rfl: 3   verapamil  (CALAN ) 80 MG tablet, Take 1 tablet (80 mg total) by mouth 2 (two) times daily. (Patient not taking: Reported on 11/22/2024), Disp: 60 tablet, Rfl: 3 [2] No Known Allergies

## 2024-11-22 NOTE — Progress Notes (Unsigned)
 "  NEW PATIENT EVALUATION ([facility])   Date:  11/23/2024  ID:  Jorge Allen, DOB 11-08-1980, MRN [id number] PCP: Georgina Speaks, FNP  [facility] Providers Cardiologist:  None        Right Bundle Branch Block Pre-diabetes  GERD OSA Obesity  Hepatic steatosis        Discussed the use of AI scribe software for clinical note transcription with the patient, who gave verbal consent to proceed. History of Present Illness Jorge Allen is a 44 y.o. male referred by Arloa Chroman, PA-C for chest pain. He was seen in the ED 11/21/24.  Troponins were neg. CT was neg for dissection. EKG showed Right Bundle Branch Block, which is new. He was started on Protonix  and given nitroglycerin .  He has been experiencing chest pain for about a month, with the most severe episode occurring last Saturday. The pain is primarily in the center and right side of the chest.  He does feel radiation to his back.  His symptoms are exacerbated by positional changes such as sitting up or lying down. The pain is not constant and does not radiate to the jaw or arms, but is felt in the back and rib cage area. He also has a dry cough and occasional shortness of breath. No fever, chills, or swelling in the legs.  No history of heart attack, heart failure, stroke, arrhythmia, high blood pressure, asthma, COPD, liver disease, or kidney disease.  Family history is significant for his mother having had cancer four times and currently having dementia, and his father having had prostate issues and glaucoma. There is no family history of heart attacks or heart failure.  Social history reveals that he drinks alcohol two to three days a week, vapes occasionally, and used to smoke cigarettes heavily. 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   ROS-See HPI     Studies Reviewed:       EKG 11/21/24: NSR, HR 84, LAD, Right  Bundle Branch Block, QTc 479   EKG 03/25/23: NSR, HR 96, normal axis, QTc 432    LABS 12/01/23: TC 123, HDL 31, Trig 169, LDL 63 10/26/24: A1c 6.0 11/21/24: K 3.7, SCr 0.75, eGFR > 60, hsTrop 6>>9, Hgb 13.9, PLT 244K  CXR 11/21/24: Bronchitis/reactive airways with basilar atelectasis or infiltrates          Physical Exam:  VS:  BP 122/74   Pulse 91   Ht 5' 5 (1.651 m)   Wt (!) 303 lb 1.3 oz (137.5 kg)   SpO2 96%   BMI 50.44 kg/m        Wt Readings from Last 3 Encounters:  11/23/24 (!) 303 lb 1.3 oz (137.5 kg)  11/22/24 (!) 304 lb (137.9 kg)  10/26/24 (!) 308 lb 12.8 oz (140.1 kg)    Constitutional:      Appearance: Healthy appearance. Not in distress.  Neck:     Vascular: JVD normal.  Pulmonary:     Breath sounds: Normal breath sounds. No wheezing. No rales.  Cardiovascular:     Normal rate. Regular rhythm.     Murmurs: There is no murmur.  Edema:    Peripheral edema absent.  Abdominal:     Palpations: Abdomen is soft.       Assessment and Plan:    Assessment & Plan Precordial chest pain RBBB (right bundle branch block) He has had intermittent chest  pain for about a month.  This seems to be exacerbated by positional changes and movement.  He sometimes has radiation to his back. Symptoms are atypical for ischemia but given risk factors of prediabetes, smoking, obesity and new right bundle branch block, I have recommended proceeding with CCTA to rule out obstructive CAD.  - Order coronary CT angiography to assess for obstructive coronary artery disease. - Metoprolol  tartrate 100 mg x 1 for CT - Order echocardiogram to evaluate for structural heart disease. - Continue nitroglycerin  as needed for chest pain. - Advised to go to the emergency room if symptoms worsen. - follow up 8 weeks Gastroesophageal reflux disease without esophagitis Given the nature of his symptoms, GERD should be considered in the differential.  He has been given Protonix .  He should continue  Protonix  40 mg daily and use Mylanta or Tums as needed. OSA (obstructive sleep apnea) Continue CPAP.             Dispo:  Return in about 8 weeks (around 01/18/2025) for follow up after testing, w/ Glendia Ferrier, PA-C.  Signed, Glendia Ferrier, PA-C    "

## 2024-11-22 NOTE — Telephone Encounter (Signed)
 FYI Only or Action Required?: FYI only for provider: appointment scheduled on 2/3.  Patient was last seen in primary care on 10/26/2024 by Georgina Speaks, FNP.  Called Nurse Triage reporting Shortness of Breath.  Symptoms began about a month ago.  Interventions attempted: Other: Seen in ED.  Symptoms are: unchanged.  Triage Disposition: See HCP Within 4 Hours (Or PCP Triage)  Patient/caregiver understands and will follow disposition?: Yes    Message from [facility] sent at 11/22/2024 11:15 AM EST  Pt went to ER due to trouble breathing and they did a x ray and said his lungs were inflamed and lungs partially collapsed and gave him medication for acid reflux and told to fu with pcp Pt is still having trouble breathing and feels like its restricted and painful to move. Hard to take a deep breath and hurts, sometimes feels like he's being punched in the back.   Reason for Disposition  [1] MILD difficulty breathing (e.g., minimal/no SOB at rest, SOB with walking, pulse < 100) AND [2] NEW-onset or WORSE than normal  Answer Assessment - Initial Assessment Questions 1. RESPIRATORY STATUS: Describe your breathing? (e.g., wheezing, shortness of breath, unable to speak, severe coughing)      Hurts when taking a deep breath, SOB  2. ONSET: When did this breathing problem begin?      X 1 month getting progressively worse, so he went to the ED   3. PATTERN Does the difficult breathing come and go, or has it been constant since it started?      Intermittent   4. SEVERITY: How bad is your breathing? (e.g., mild, moderate, severe)      Mild   5. RECURRENT SYMPTOM: Have you had difficulty breathing before? If Yes, ask: When was the last time? and What happened that time?      No   6. CARDIAC HISTORY: Do you have any history of heart disease? (e.g., heart attack, angina, bypass surgery, angioplasty)      No   7. LUNG HISTORY: Do you have any history of lung disease?  (e.g.,  pulmonary embolus, asthma, emphysema)     No   8. CAUSE: What do you think is causing the breathing problem?      Unsure, seen in ED yesterday  9. OTHER SYMPTOMS: Do you have any other symptoms? (e.g., chest pain, cough, dizziness, fever, runny nose)     Cough noted.      Patient called in to triage with complaints of SOB.  This has been ongoing for one month.  The patient stated he was seen in the ED yesterday for Chest pain and was diagnosed with unspecified Chest pain, and was advised to follow up with PCP. He was treated for acid reflux. Now having SOB.  Taking omeprazole .   Appointment scheduled for further evaluation for an acute visit as the hospital follow-up appts. Are not within a reasonable time frame for the symptoms, and differs from the chest pain reason for the ED visit. ; Patient agrees with the plan of care, and will reach out if symptoms worsen or persist.  Protocols used: Breathing Difficulty-A-AH

## 2024-11-23 ENCOUNTER — Encounter: Payer: Self-pay | Admitting: *Deleted

## 2024-11-23 ENCOUNTER — Other Ambulatory Visit (HOSPITAL_BASED_OUTPATIENT_CLINIC_OR_DEPARTMENT_OTHER): Payer: Self-pay

## 2024-11-23 ENCOUNTER — Encounter: Payer: Self-pay | Admitting: Physician Assistant

## 2024-11-23 ENCOUNTER — Ambulatory Visit: Admitting: Physician Assistant

## 2024-11-23 VITALS — BP 122/74 | HR 91 | Ht 65.0 in | Wt 303.1 lb

## 2024-11-23 DIAGNOSIS — K219 Gastro-esophageal reflux disease without esophagitis: Secondary | ICD-10-CM

## 2024-11-23 DIAGNOSIS — G4733 Obstructive sleep apnea (adult) (pediatric): Secondary | ICD-10-CM

## 2024-11-23 DIAGNOSIS — R072 Precordial pain: Secondary | ICD-10-CM

## 2024-11-23 DIAGNOSIS — I4510 Unspecified right bundle-branch block: Secondary | ICD-10-CM | POA: Diagnosis not present

## 2024-11-23 MED ORDER — METOPROLOL TARTRATE 100 MG PO TABS
100.0000 mg | ORAL_TABLET | Freq: Once | ORAL | 0 refills | Status: AC
  Filled 2024-11-23: qty 1, 1d supply, fill #0

## 2024-11-23 NOTE — Assessment & Plan Note (Signed)
 Given the nature of his symptoms, GERD should be considered in the differential.  He has been given Protonix .  He should continue Protonix  40 mg daily and use Mylanta or Tums as needed.

## 2024-11-23 NOTE — Patient Instructions (Addendum)
 Medication Instructions:  Your physician recommends that you continue on your current medications as directed. Please refer to the Current Medication list given to you today.  *If you need a refill on your cardiac medications before your next appointment, please call your pharmacy*  Lab Work: None ordered  If you have labs (blood work) drawn today and your tests are completely normal, you will receive your results only by: MyChart Message (if you have MyChart) OR A paper copy in the mail If you have any lab test that is abnormal or we need to change your treatment, we will call you to review the results.  Testing/Procedures: Your physician has requested that you have an echocardiogram. Echocardiography is a painless test that uses sound waves to create images of your heart. It provides your doctor with information about the size and shape of your heart and how well your hearts chambers and valves are working. This procedure takes approximately one hour. There are no restrictions for this procedure. Please do NOT wear cologne, perfume, aftershave, or lotions (deodorant is allowed). Please arrive 15 minutes prior to your appointment time.  Please note: We ask at that you not bring children with you during ultrasound (echo/ vascular) testing. Due to room size and safety concerns, children are not allowed in the ultrasound rooms during exams. Our front office staff cannot provide observation of children in our lobby area while testing is being conducted. An adult accompanying a patient to their appointment will only be allowed in the ultrasound room at the discretion of the ultrasound technician under special circumstances. We apologize for any inconvenience.   Your physician has requested that you have cardiac CT. Cardiac computed tomography (CT) is a painless test that uses an x-ray machine to take clear, detailed pictures of your heart. For further information please visit [email or web address]  Please follow instruction BELOW:    Your cardiac CT has been scheduled at:    [facility] [address]  [place], [place] [address]  2/18 ARRIVE AT 1:30     If scheduled at the Heart [facility] V[facility] at [facility] street, please enter the parking lot using the [address] entrance and use the FREE valet service at the patient drop-off area. Enter the building and check-in with registration on the main floor.  Please follow these instructions carefully (unless otherwise directed):  An IV will be required for this test and Nitroglycerin  will be given.  Hold all erectile dysfunction medications at least 3 days (72 hrs) prior to test. (Ie viagra, cialis, sildenafil, tadalafil, etc)   On the Night Before the Test: Be sure to Drink plenty of water. Do not consume any caffeinated/decaffeinated beverages or chocolate 12 hours prior to your test. Do not take any antihistamines 12 hours prior to your test.   On the Day of the Test: Drink plenty of water until 1 hour prior to the test. Do not eat any food 1 hour prior to test. You may take your regular medications prior to the test.  Take metoprolol  (Lopressor ) 100 MG two hours prior to test. THIS HAS BEEN SENT TO OUR PHARMACY ON THE 1ST FLOOR         After the Test: Drink plenty of water. After receiving IV contrast, you may experience a mild flushed feeling. This is normal. On occasion, you may experience a mild rash up to 24 hours after the test. This is not dangerous. If this occurs, you can take Benadryl  25 mg,  Zyrtec , Claritin, or Allegra and increase your fluid intake. (Patients taking Tikosyn should avoid Benadryl , and may take Zyrtec , Claritin, or Allegra) If you experience trouble breathing, this can be serious. If it is severe call 911 IMMEDIATELY. If it is mild, please call our office.  We will call to schedule your test 2-4 weeks out understanding that some insurance companies will need an  authorization prior to the service being performed.   For more information and frequently asked questions, please visit our website : [email or web address]  For non-scheduling related questions, please contact the cardiac imaging nurse navigator should you have any questions/concerns: Cardiac Imaging Nurse Navigators Direct Office Dial: [phone]   For scheduling needs, including cancellations and rescheduling, please call Brittany, [phone].  For billing questions, please call [phone].    Follow-Up: At [facility], you and your health needs are our priority.  As part of our continuing mission to provide you with exceptional heart care, our providers are all part of one team.  This team includes your primary Cardiologist (physician) and Advanced Practice Providers or APPs (Physician Assistants and Nurse Practitioners) who all work together to provide you with the care you need, when you need it.  Your next appointment:   8 week(s)  Provider:   Glendia Ferrier, PA-C          We recommend signing up for the patient portal called MyChart.  Sign up information is provided on this After Visit Summary.  MyChart is used to connect with patients for Virtual Visits (Telemedicine).  Patients are able to view lab/test results, encounter notes, upcoming appointments, etc.  Non-urgent messages can be sent to your provider as well.   To learn more about what you can do with MyChart, go to [email or web address].   Other Instructions

## 2024-12-01 ENCOUNTER — Encounter: Payer: 59 | Admitting: Nurse Practitioner

## 2024-12-07 ENCOUNTER — Other Ambulatory Visit (HOSPITAL_COMMUNITY)

## 2024-12-08 ENCOUNTER — Ambulatory Visit (HOSPITAL_COMMUNITY)

## 2025-01-05 ENCOUNTER — Ambulatory Visit: Payer: Self-pay | Admitting: Nurse Practitioner

## 2025-01-05 ENCOUNTER — Ambulatory Visit (HOSPITAL_BASED_OUTPATIENT_CLINIC_OR_DEPARTMENT_OTHER): Admitting: Pulmonary Disease

## 2025-01-05 ENCOUNTER — Ambulatory Visit (HOSPITAL_COMMUNITY)

## 2025-01-24 ENCOUNTER — Ambulatory Visit: Admitting: Physician Assistant

## 2025-01-26 ENCOUNTER — Encounter: Payer: Self-pay | Admitting: Nurse Practitioner
